# Patient Record
Sex: Male | Born: 1985
Health system: Southern US, Community
[De-identification: ages and names within clinical notes are randomized; demographics above are authoritative.]

## PROBLEM LIST (undated history)

## (undated) DIAGNOSIS — F32A Depression, unspecified: Secondary | ICD-10-CM

## (undated) DIAGNOSIS — F909 Attention-deficit hyperactivity disorder, unspecified type: Secondary | ICD-10-CM

## (undated) DIAGNOSIS — F419 Anxiety disorder, unspecified: Secondary | ICD-10-CM

## (undated) DIAGNOSIS — Z91018 Allergy to other foods: Secondary | ICD-10-CM

## (undated) DIAGNOSIS — K219 Gastro-esophageal reflux disease without esophagitis: Secondary | ICD-10-CM

## (undated) HISTORY — DX: Attention-deficit hyperactivity disorder, unspecified type: F90.9

## (undated) HISTORY — DX: Anxiety disorder, unspecified: F41.9

## (undated) HISTORY — PX: APPENDECTOMY: SHX54

## (undated) HISTORY — DX: Depression, unspecified: F32.A

## (undated) HISTORY — PX: HERNIA REPAIR: SHX51

---

## 2007-03-08 ENCOUNTER — Ambulatory Visit: Payer: Self-pay

## 2014-02-09 ENCOUNTER — Ambulatory Visit: Payer: Self-pay | Admitting: Family Medicine

## 2016-08-16 ENCOUNTER — Ambulatory Visit
Admission: EM | Admit: 2016-08-16 | Discharge: 2016-08-16 | Disposition: A | Discharge status: Home / Self Care | Payer: 59 | Attending: Family Medicine | Admitting: Family Medicine

## 2016-08-16 ENCOUNTER — Encounter: Payer: Self-pay | Admitting: Emergency Medicine

## 2016-08-16 DIAGNOSIS — S70369A Insect bite (nonvenomous), unspecified thigh, initial encounter: Secondary | ICD-10-CM

## 2016-08-16 DIAGNOSIS — R21 Rash and other nonspecific skin eruption: Secondary | ICD-10-CM

## 2016-08-16 DIAGNOSIS — L509 Urticaria, unspecified: Secondary | ICD-10-CM

## 2016-08-16 DIAGNOSIS — S70361A Insect bite (nonvenomous), right thigh, initial encounter: Secondary | ICD-10-CM

## 2016-08-16 DIAGNOSIS — W57XXXA Bitten or stung by nonvenomous insect and other nonvenomous arthropods, initial encounter: Secondary | ICD-10-CM

## 2016-08-16 LAB — CBC WITH DIFFERENTIAL/PLATELET
BASOS PCT: 1 %
Basophils Absolute: 0 10*3/uL (ref 0–0.1)
EOS ABS: 0.1 10*3/uL (ref 0–0.7)
Eosinophils Relative: 1 %
HCT: 46.9 % (ref 40.0–52.0)
Hemoglobin: 15.9 g/dL (ref 13.0–18.0)
LYMPHS ABS: 2.9 10*3/uL (ref 1.0–3.6)
Lymphocytes Relative: 32 %
MCH: 28.3 pg (ref 26.0–34.0)
MCHC: 33.9 g/dL (ref 32.0–36.0)
MCV: 83.4 fL (ref 80.0–100.0)
MONO ABS: 0.7 10*3/uL (ref 0.2–1.0)
Monocytes Relative: 8 %
NEUTROS PCT: 58 %
Neutro Abs: 5.4 10*3/uL (ref 1.4–6.5)
PLATELETS: 293 10*3/uL (ref 150–440)
RBC: 5.63 MIL/uL (ref 4.40–5.90)
RDW: 13.3 % (ref 11.5–14.5)
WBC: 9.1 10*3/uL (ref 3.8–10.6)

## 2016-08-16 MED ORDER — RANITIDINE HCL 150 MG PO CAPS: 150.0000 mg | ORAL_CAPSULE | Freq: Two times a day (BID) | ORAL | 0 refills | Status: DC

## 2016-08-16 MED ORDER — CETIRIZINE HCL 10 MG PO TABS: 10.0000 mg | ORAL_TABLET | Freq: Every day | ORAL | 0 refills | Status: DC

## 2016-08-16 MED ORDER — LORATADINE 10 MG PO TABS: 10.0000 mg | ORAL_TABLET | Freq: Every day | ORAL | 0 refills | Status: DC

## 2016-08-16 MED ORDER — PREDNISONE 10 MG (21) PO TBPK: ORAL_TABLET | ORAL | 0 refills | Status: DC

## 2016-08-16 NOTE — ED Provider Notes (Signed)
[facility]    CSN: [id number] Arrival date & time: 08/16/16  1761     History   Chief Complaint Chief Complaint  Patient presents with  . Rash    HPI Darren Miller is a 31 y.o. male.   Patient's here because of rash. He reports rash started yesterday rash is described as being urticaria. Everything started yesterday after eating a pulled pork sandwich. He is taking no new medications but he did state that Sunday he pulled a small tic off of him that was on his right thigh. He thought it looked a little bit like [place] but he does also state do not have the white marking in the middle. No history of hives before no known drug allergies. He's had abdominal surgery at medical problems he takes zantac daily. no known drug allergies she does smoke daily.   The history is provided by the patient. No language interpreter was used.  Rash  Location:  Full body Severity:  Severe Onset quality:  Sudden Duration:  2 days Timing:  Intermittent Progression:  Waxing and waning Chronicity:  New Relieved by:  Nothing Ineffective treatments:  Antihistamines Associated symptoms: no fever, no myalgias, no shortness of breath, no sore throat, no throat swelling, no tongue swelling and not wheezing     History reviewed. No pertinent past medical history.  There are no active problems to display for this patient.   Past Surgical History:  Procedure Laterality Date  . ABDOMINAL SURGERY         Home Medications    Prior to Admission medications   Medication Sig Start Date End Date Taking? Authorizing Provider  cetirizine (ZYRTEC) 10 MG tablet Take 1 tablet (10 mg total) by mouth daily. If needed at night for itching not relieved by Claritin in the morning. 08/16/16   Frederich Cha, MD  loratadine (CLARITIN) 10 MG tablet Take 1 tablet (10 mg total) by mouth daily. Take 1 tablet in the morning. As needed for itching. 08/16/16   Frederich Cha, MD  predniSONE (STERAPRED UNI-PAK  21 TAB) 10 MG (21) TBPK tablet Sig 6 tablet day 1, 5 tablets day 2, 4 tablets day 3,,3tablets day 4, 2 tablets day 5, 1 tablet day 6 take all tablets orally 08/16/16   Frederich Cha, MD  ranitidine (ZANTAC) 150 MG capsule Take 1 capsule (150 mg total) by mouth 2 (two) times daily. 08/16/16   Frederich Cha, MD    Family History History reviewed. No pertinent family history.  Social History Social History  Substance Use Topics  . Smoking status: Current Every Day Smoker    Types: Cigarettes  . Smokeless tobacco: Never Used  . Alcohol use Yes     Allergies   Patient has no known allergies.   Review of Systems Review of Systems  Constitutional: Negative for fever.  HENT: Negative for sore throat, trouble swallowing and voice change.   Respiratory: Negative for cough, chest tightness, shortness of breath and wheezing.   Musculoskeletal: Negative for myalgias.  Skin: Positive for rash.  All other systems reviewed and are negative.    Physical Exam Triage Vital Signs ED Triage Vitals  Enc Vitals Group     BP 08/16/16 0836 127/86     Pulse Rate 08/16/16 0836 68     Resp 08/16/16 0836 16     Temp 08/16/16 0836 98.4 F (36.9 C)     Temp Source 08/16/16 0836 Oral     SpO2 08/16/16 0836 99 %  Weight 08/16/16 0834 230 lb (104.3 kg)     Height 08/16/16 0834 5\' 10"  (1.778 m)     Head Circumference --      Peak Flow --      Pain Score 08/16/16 0834 0     Pain Loc --      Pain Edu? --      Excl. in [place]? --    No data found.   Updated Vital Signs BP 127/86 (BP Location: Left Arm)   Pulse 68   Temp 98.4 F (36.9 C) (Oral)   Resp 16   Ht 5\' 10"  (1.778 m)   Wt 230 lb (104.3 kg)   SpO2 99%   BMI 33.00 kg/m   Visual Acuity Right Eye Distance:   Left Eye Distance:   Bilateral Distance:    Right Eye Near:   Left Eye Near:    Bilateral Near:     Physical Exam  Constitutional: He appears well-developed and well-nourished.  HENT:  Head: Normocephalic and atraumatic.    Right Ear: External ear normal.  Nose: Nose normal. No mucosal edema.  Mouth/Throat: Uvula is midline, oropharynx is clear and moist and mucous membranes are normal. No oral lesions. No dental abscesses or uvula swelling.  Neck: Normal range of motion. Neck supple. No thyromegaly present.  Cardiovascular: Normal rate.   Pulmonary/Chest: Effort normal and breath sounds normal. No respiratory distress.  Musculoskeletal: Normal range of motion.  Lymphadenopathy:    He has no cervical adenopathy.  Neurological: He is alert.  Skin: Rash noted. Rash is urticarial.  She has scattered urticarial-like rash is present on some areas worsening of this shows pictures of the rash yesterday was almost his body and much more raised and prominent  Psychiatric: He has a normal mood and affect.  Vitals reviewed.    UC Treatments / Results  Labs (all labs ordered are listed, but only abnormal results are displayed) Labs Reviewed  CBC WITH DIFFERENTIAL/PLATELET  ROCKY MTN SPOTTED FVR ABS PNL(IGG+IGM)  B. BURGDORFI ANTIBODIES    EKG  EKG Interpretation None       Radiology No results found.  Procedures Procedures (including critical care time)  Medications Ordered in UC Medications - No data to display   Initial Impression / Assessment and Plan / UC Course  I have reviewed the triage vital signs and the nursing notes.  Pertinent labs & imaging results that were available during my care of the patient were reviewed by me and considered in my medical decision making (see chart for details).    Doubt the tick exposures causing him to have a reaction allergy to red meats will check Lyme's test and Lachman's by fever also be very doubtful for him to have this problem so soon after removing that tick tick bite a month ago may be but not particularly move Sunday. Will also recommend he stop the Benadryl he took about 3 Benadryl's within an hour yesterday the child help with her itching course a  cause sedation. We'll place on 6 day course of prednisone may not be treating anything or do any different. There is no signs of anaphylaxis this time. We'll place him on Claritin 10 mg 1 tablet daily Zantac 150 twice a day and Zyrtec to take at night if needed stay with Benadryl and work note for today and tomorrow will be given as well. Also try to provide education about urticaria as well   Final Clinical Impressions(s) / UC Diagnoses   Final  diagnoses:  Rash  Hives  Rash and nonspecific skin eruption  Tick bite of thigh, unspecified laterality, initial encounter    New Prescriptions New Prescriptions   CETIRIZINE (ZYRTEC) 10 MG TABLET    Take 1 tablet (10 mg total) by mouth daily. If needed at night for itching not relieved by Claritin in the morning.   LORATADINE (CLARITIN) 10 MG TABLET    Take 1 tablet (10 mg total) by mouth daily. Take 1 tablet in the morning. As needed for itching.   PREDNISONE (STERAPRED UNI-PAK 21 TAB) 10 MG (21) TBPK TABLET    Sig 6 tablet day 1, 5 tablets day 2, 4 tablets day 3,,3tablets day 4, 2 tablets day 5, 1 tablet day 6 take all tablets orally   RANITIDINE (ZANTAC) 150 MG CAPSULE    Take 1 capsule (150 mg total) by mouth 2 (two) times daily.   Note: This dictation was prepared with Dragon dictation along with smaller phrase technology. Any transcriptional errors that result from this process are unintentional.   Frederich Cha, MD 08/16/16 [phone]

## 2016-08-16 NOTE — ED Triage Notes (Signed)
Patient reports hives and itching all over his body that started last night.

## 2016-08-17 ENCOUNTER — Emergency Department
Admission: EM | Admit: 2016-08-17 | Discharge: 2016-08-18 | Disposition: A | Discharge status: Home / Self Care | Payer: 59 | Attending: Emergency Medicine | Admitting: Emergency Medicine

## 2016-08-17 ENCOUNTER — Encounter: Payer: Self-pay | Admitting: Emergency Medicine

## 2016-08-17 DIAGNOSIS — R21 Rash and other nonspecific skin eruption: Secondary | ICD-10-CM | POA: Diagnosis present

## 2016-08-17 DIAGNOSIS — F17210 Nicotine dependence, cigarettes, uncomplicated: Secondary | ICD-10-CM | POA: Diagnosis not present

## 2016-08-17 DIAGNOSIS — L509 Urticaria, unspecified: Secondary | ICD-10-CM | POA: Insufficient documentation

## 2016-08-17 LAB — B. BURGDORFI ANTIBODIES: B burgdorferi Ab IgG+IgM: <0.91 {ISR} (ref 0.00–0.90)

## 2016-08-17 MED ORDER — EPINEPHRINE 0.3 MG/0.3ML IJ SOAJ
0.3000 mg | Freq: Once | INTRAMUSCULAR | 0 refills | Status: AC
Start: 2016-08-18 — End: 2016-08-18

## 2016-08-17 MED ORDER — PREDNISONE 20 MG PO TABS: 60.0000 mg | ORAL_TABLET | Freq: Every day | ORAL | 0 refills | Status: DC

## 2016-08-17 MED ORDER — PREDNISONE 20 MG PO TABS
50.0000 mg | ORAL_TABLET | Freq: Once | ORAL | Status: AC
  Administered 2016-08-17: 50 mg via ORAL
  Filled 2016-08-17: qty 2

## 2016-08-17 NOTE — ED Provider Notes (Signed)
[facility] Emergency Department Provider Note   ____________________________________________   First MD Initiated Contact with Patient 08/17/16 2308     (approximate)  I have reviewed the triage vital signs and the nursing notes.   HISTORY  Chief Complaint Rash    HPI Darren Miller is a 31 y.o. male who comes into the hospital today with hives. The patient reports that the hives started Wednesday. He was at work and had just eaten a lunch of pulled pork. He reports that they appeared all over his body. Initially there was small and then they became more confluent and larger. The patient took some Benadryl for his hives but it goes away and then comes back. He went to urgent care 2 days ago and has been taking 10 mg of prednisone with Benadryl, Claritin and Zyrtec but has not been helping. The hives continue to return each day. The patient has had some chest tightness so they decided to come in for evaluation. The patient took some Benadryl before so he reports that his hives are improved at the moment. The patient had eggs with chicken salad and pizza today and yesterday he slept most of the day. The patient noticed a tick on his thigh on Saturday which she pulled off. He denies any fever and his blood pressure has been a little elevated. The patient states that he has had some swelling and the rash has been on his hands as well. The patient was concerned so he decided to come in today. He is here for evaluation. He does not have a primary care physician.   History reviewed. No pertinent past medical history.  There are no active problems to display for this patient.   Past Surgical History:  Procedure Laterality Date  . APPENDECTOMY      Prior to Admission medications   Medication Sig Start Date End Date Taking? Authorizing Provider  cetirizine (ZYRTEC) 10 MG tablet Take 1 tablet (10 mg total) by mouth daily. If needed at night for itching not relieved  by Claritin in the morning. 08/16/16   Frederich Cha, MD  EPINEPHrine (EPIPEN 2-PAK) 0.3 mg/0.3 mL IJ SOAJ injection Inject 0.3 mLs (0.3 mg total) into the muscle once. As needed for respiratory distress, or severe angioedema 08/18/16 08/18/16  Loney Hering, MD  loratadine (CLARITIN) 10 MG tablet Take 1 tablet (10 mg total) by mouth daily. Take 1 tablet in the morning. As needed for itching. 08/16/16   Frederich Cha, MD  predniSONE (DELTASONE) 20 MG tablet Take 3 tablets (60 mg total) by mouth daily. 08/17/16   Loney Hering, MD  predniSONE (STERAPRED UNI-PAK 21 TAB) 10 MG (21) TBPK tablet Sig 6 tablet day 1, 5 tablets day 2, 4 tablets day 3,,3tablets day 4, 2 tablets day 5, 1 tablet day 6 take all tablets orally 08/16/16   Frederich Cha, MD  ranitidine (ZANTAC) 150 MG capsule Take 1 capsule (150 mg total) by mouth 2 (two) times daily. 08/16/16   Frederich Cha, MD    Allergies Patient has no known allergies.  No family history on file.  Social History Social History  Substance Use Topics  . Smoking status: Current Every Day Smoker    Types: Cigarettes  . Smokeless tobacco: Never Used  . Alcohol use Yes    Review of Systems  Constitutional: No fever/chills Eyes: No visual changes. ENT: No sore throat. Cardiovascular: Intermittent chest tightness Respiratory: Denies shortness of breath. Gastrointestinal: No abdominal pain.  No nausea, no vomiting.  No diarrhea.  No constipation. Genitourinary: Negative for dysuria. Musculoskeletal: Negative for back pain. Skin: Hives and itching Neurological: Negative for headaches, focal weakness or numbness.   ____________________________________________   PHYSICAL EXAM:  VITAL SIGNS: ED Triage Vitals [08/17/16 2146]  Enc Vitals Group     BP (!) 143/84     Pulse Rate 97     Resp 20     Temp 98.6 F (37 C)     Temp Source Oral     SpO2 98 %     Weight      Height      Head Circumference      Peak Flow      Pain Score      Pain Loc       Pain Edu?      Excl. in [place]?     Constitutional: Alert and oriented. Well appearing and in mild distress. Eyes: Conjunctivae are normal. PERRL. EOMI. Head: Atraumatic. Nose: No congestion/rhinnorhea. Mouth/Throat: Mucous membranes are moist.  Oropharynx non-erythematous. Cardiovascular: Normal rate, regular rhythm. Grossly normal heart sounds.  Good peripheral circulation. Respiratory: Normal respiratory effort.  No retractions. Lungs CTAB. Gastrointestinal: Soft and nontender. No distention. Positive bowel sounds Musculoskeletal: No lower extremity tenderness nor edema.   Neurologic:  Normal speech and language.  Skin:  Skin is warm, dry and intact. Some small scattered sparse hives to left upper extremity no significant confluent hives noted. Psychiatric: Mood and affect are normal.   ____________________________________________   LABS (all labs ordered are listed, but only abnormal results are displayed)  Labs Reviewed - No data to display ____________________________________________  EKG  none ____________________________________________  RADIOLOGY  No results found.  ____________________________________________   PROCEDURES  Procedure(s) performed: None  Procedures  Critical Care performed: No  ____________________________________________   INITIAL IMPRESSION / ASSESSMENT AND PLAN / ED COURSE  Pertinent labs & imaging results that were available during my care of the patient were reviewed by me and considered in my medical decision making (see chart for details).  This is a 31 year old male who comes into the hospital today with hives. The patient has had hives going on for approximately 3 days. He's been taking Benadryl as well as some prednisone and Zyrtec and Claritin for the last couple of days. He reports that it does still come. The patient's wife showed me some pictures of confluent hives that he has had a right now his hives are improved. The patient  states that he did take some Benadryl before coming. I informed the patient that I feel that he needs a higher dose of prednisone and I will give him 50 mg. He also needs to consider that he may have developed a protein allergy due to his tick bite. I've encouraged him to hold off on animal proteins and to be tested by an allergist. I will also write the patient an EpiPen in the event that his reactions become worse. The patient will be discharged to home.      ____________________________________________   FINAL CLINICAL IMPRESSION(S) / ED DIAGNOSES  Final diagnoses:  Hives      NEW MEDICATIONS STARTED DURING THIS VISIT:  New Prescriptions   EPINEPHRINE (EPIPEN 2-PAK) 0.3 MG/0.3 ML IJ SOAJ INJECTION    Inject 0.3 mLs (0.3 mg total) into the muscle once. As needed for respiratory distress, or severe angioedema   PREDNISONE (DELTASONE) 20 MG TABLET    Take 3 tablets (60 mg total) by mouth daily.  Note:  This document was prepared using Dragon voice recognition software and may include unintentional dictation errors.    Loney Hering, MD 08/17/16 [phone]

## 2016-08-17 NOTE — ED Triage Notes (Signed)
Patient reports keeps breaking out in rash and occasional hives that started 08/15/16.  Patient with no respiratory distress noted.

## 2016-08-17 NOTE — Discharge Instructions (Signed)
PLease follow up with allergy for further evaluation.

## 2016-08-20 LAB — RMSF, IGG, IFA: RMSF, IGG, IFA: <1:64 {titer}

## 2016-08-20 LAB — ROCKY MTN SPOTTED FVR ABS PNL(IGG+IGM)
RMSF IGG: POSITIVE — AB
RMSF IGM: 0.22 {index} (ref 0.00–0.89)

## 2016-08-23 DIAGNOSIS — L500 Allergic urticaria: Secondary | ICD-10-CM | POA: Diagnosis not present

## 2016-08-24 DIAGNOSIS — J305 Allergic rhinitis due to food: Secondary | ICD-10-CM | POA: Diagnosis not present

## 2016-08-27 ENCOUNTER — Telehealth: Payer: Self-pay

## 2016-08-27 MED ORDER — DOXYCYCLINE HYCLATE 100 MG PO CAPS: 100.0000 mg | ORAL_CAPSULE | Freq: Two times a day (BID) | ORAL | 0 refills | Status: DC

## 2016-09-04 ENCOUNTER — Ambulatory Visit: Payer: Self-pay | Admitting: Family Medicine

## 2016-11-15 ENCOUNTER — Ambulatory Visit
Admission: EM | Admit: 2016-11-15 | Discharge: 2016-11-15 | Disposition: A | Discharge status: Home / Self Care | Payer: 59 | Attending: Family Medicine | Admitting: Family Medicine

## 2016-11-15 ENCOUNTER — Encounter: Payer: Self-pay | Admitting: Emergency Medicine

## 2016-11-15 DIAGNOSIS — R42 Dizziness and giddiness: Secondary | ICD-10-CM

## 2016-11-15 DIAGNOSIS — K529 Noninfective gastroenteritis and colitis, unspecified: Secondary | ICD-10-CM | POA: Diagnosis not present

## 2016-11-15 HISTORY — DX: Allergy to other foods: Z91.018

## 2016-11-15 MED ORDER — ONDANSETRON HCL 4 MG PO TABS: 4.0000 mg | ORAL_TABLET | Freq: Three times a day (TID) | ORAL | 0 refills | Status: DC | PRN

## 2016-11-15 NOTE — Discharge Instructions (Signed)
Zofran as needed for nausea.  Hydrate, hydrate, hydrate.  Take care  Dr. Lacinda Axon

## 2016-11-15 NOTE — ED Provider Notes (Signed)
[facility]    CSN: [id number] Arrival date & time: 11/15/16  9509     History   Chief Complaint Chief Complaint  Patient presents with  . Dizziness  . Generalized Body Aches   HPI  31 year old male presents with the above complaints.  Patient states that on Tuesday he developed nausea, vomiting, diarrhea.  He reports associated abdominal pain.  He also had fever.  His GI symptoms have now improved.  He has not had any vomiting or diarrhea today.  However, he still does not feel well.  He reports body aches, dizziness.  He states that he is able to eat and drink but has little interest in doing so.  No known inciting factor.  No known exacerbating or relieving factors.  No other associated symptoms.  No other complaints at this time.  Past Medical History:  Diagnosis Date  . Allergy to alpha-gal    Past Surgical History:  Procedure Laterality Date  . APPENDECTOMY     Home Medications    Prior to Admission medications   Medication Sig Start Date End Date Taking? Authorizing Provider  ondansetron (ZOFRAN) 4 MG tablet Take 1 tablet (4 mg total) by mouth every 8 (eight) hours as needed for nausea or vomiting. 11/15/16   Coral Spikes, DO   Family History Family History  Problem Relation Age of Onset  . Thyroid disease Mother    Social History Social History  Substance Use Topics  . Smoking status: Light Tobacco Smoker    Types: Cigarettes  . Smokeless tobacco: Never Used     Comment: smokes socially  . Alcohol use Yes     Comment: socially    Allergies   Patient has no known allergies.   Review of Systems Review of Systems  Constitutional: Positive for fever.  Gastrointestinal: Positive for abdominal pain, diarrhea, nausea and vomiting.  Musculoskeletal:       Body aches.  Neurological: Positive for dizziness.   Physical Exam Triage Vital Signs ED Triage Vitals  Enc Vitals Group     BP 11/15/16 0839 140/86     Pulse Rate 11/15/16 0839 84    Resp 11/15/16 0839 16     Temp 11/15/16 0839 98.6 F (37 C)     Temp Source 11/15/16 0839 Oral     SpO2 11/15/16 0839 99 %     Weight 11/15/16 0840 223 lb (101.2 kg)     Height 11/15/16 0840 5\' 10"  (1.778 m)     Head Circumference --      Peak Flow --      Pain Score 11/15/16 0840 0     Pain Loc --      Pain Edu? --      Excl. in [place]? --    No data found.   Updated Vital Signs BP 140/86 (BP Location: Left Arm)   Pulse 84   Temp 98.6 F (37 C) (Oral)   Resp 16   Ht 5\' 10"  (1.778 m)   Wt 223 lb (101.2 kg)   SpO2 99%   BMI 32.00 kg/m  Physical Exam  Constitutional: He is oriented to person, place, and time. He appears well-developed. No distress.  HENT:  Head: Normocephalic and atraumatic.  Oropharynx clear.  Eyes: Conjunctivae are normal. No scleral icterus.  Cardiovascular: Normal rate and regular rhythm.   No murmur heard. Pulmonary/Chest: Effort normal and breath sounds normal. He has no wheezes. He has no rales.  Abdominal:  Soft, nondistended.  Mildly tender diffusely.  Neurological: He is alert and oriented to person, place, and time.  Skin: Skin is warm. No rash noted.  Psychiatric: He has a normal mood and affect.  Vitals reviewed.  UC Treatments / Results  Labs (all labs ordered are listed, but only abnormal results are displayed) Labs Reviewed - No data to display  EKG  EKG Interpretation None       Radiology No results found.  Procedures Procedures (including critical care time)  Medications Ordered in UC Medications - No data to display   Initial Impression / Assessment and Plan / UC Course  I have reviewed the triage vital signs and the nursing notes.  Pertinent labs & imaging results that were available during my care of the patient were reviewed by me and considered in my medical decision making (see chart for details).     31 year old male presents with resolving gastroenteritis.  He is volume depleted.  This is the culprit of his  current symptoms.  Zofran for nausea.  Advised aggressive hydration.  Out of work for today and tomorrow.  Final Clinical Impressions(s) / UC Diagnoses   Final diagnoses:  Gastroenteritis    New Prescriptions New Prescriptions   ONDANSETRON (ZOFRAN) 4 MG TABLET    Take 1 tablet (4 mg total) by mouth every 8 (eight) hours as needed for nausea or vomiting.   Controlled Substance Prescriptions [place] Controlled Substance Registry consulted? Not Applicable   Coral Spikes, [place] 11/15/16 5056

## 2016-11-15 NOTE — ED Triage Notes (Signed)
Patient in today c/o dizziness, body aches, diarrhea and vomiting x 2 days. Patient had fever of 102. Patient denies sore throat.

## 2017-04-29 DIAGNOSIS — T7809XD Anaphylactic reaction due to other food products, subsequent encounter: Secondary | ICD-10-CM | POA: Diagnosis not present

## 2017-05-06 DIAGNOSIS — J029 Acute pharyngitis, unspecified: Secondary | ICD-10-CM | POA: Diagnosis not present

## 2017-07-12 DIAGNOSIS — Z0000 Encounter for general adult medical examination without abnormal findings: Secondary | ICD-10-CM | POA: Diagnosis not present

## 2017-07-12 DIAGNOSIS — R5383 Other fatigue: Secondary | ICD-10-CM | POA: Diagnosis not present

## 2017-07-12 DIAGNOSIS — E8809 Other disorders of plasma-protein metabolism, not elsewhere classified: Secondary | ICD-10-CM | POA: Insufficient documentation

## 2017-07-12 DIAGNOSIS — G4733 Obstructive sleep apnea (adult) (pediatric): Secondary | ICD-10-CM | POA: Insufficient documentation

## 2017-07-12 DIAGNOSIS — R0683 Snoring: Secondary | ICD-10-CM | POA: Insufficient documentation

## 2017-07-15 DIAGNOSIS — Z0000 Encounter for general adult medical examination without abnormal findings: Secondary | ICD-10-CM | POA: Diagnosis not present

## 2017-07-17 DIAGNOSIS — M79621 Pain in right upper arm: Secondary | ICD-10-CM | POA: Diagnosis not present

## 2017-07-17 DIAGNOSIS — M79601 Pain in right arm: Secondary | ICD-10-CM | POA: Diagnosis not present

## 2017-08-01 ENCOUNTER — Ambulatory Visit: Payer: 59 | Attending: Internal Medicine

## 2017-08-01 DIAGNOSIS — G4710 Hypersomnia, unspecified: Secondary | ICD-10-CM | POA: Insufficient documentation

## 2017-08-01 DIAGNOSIS — G4733 Obstructive sleep apnea (adult) (pediatric): Secondary | ICD-10-CM | POA: Insufficient documentation

## 2017-08-01 DIAGNOSIS — G4730 Sleep apnea, unspecified: Secondary | ICD-10-CM | POA: Diagnosis not present

## 2017-08-01 DIAGNOSIS — G4761 Periodic limb movement disorder: Secondary | ICD-10-CM | POA: Insufficient documentation

## 2017-08-01 DIAGNOSIS — R0683 Snoring: Secondary | ICD-10-CM | POA: Diagnosis present

## 2017-08-05 DIAGNOSIS — H1033 Unspecified acute conjunctivitis, bilateral: Secondary | ICD-10-CM | POA: Diagnosis not present

## 2017-08-14 DIAGNOSIS — F988 Other specified behavioral and emotional disorders with onset usually occurring in childhood and adolescence: Secondary | ICD-10-CM | POA: Insufficient documentation

## 2017-10-08 DIAGNOSIS — Z23 Encounter for immunization: Secondary | ICD-10-CM | POA: Diagnosis not present

## 2017-11-05 DIAGNOSIS — K922 Gastrointestinal hemorrhage, unspecified: Secondary | ICD-10-CM | POA: Diagnosis not present

## 2017-11-11 ENCOUNTER — Other Ambulatory Visit: Payer: Self-pay

## 2017-11-11 ENCOUNTER — Ambulatory Visit (INDEPENDENT_AMBULATORY_CARE_PROVIDER_SITE_OTHER): Payer: 59 | Admitting: Gastroenterology

## 2017-11-11 ENCOUNTER — Encounter: Payer: Self-pay | Admitting: Gastroenterology

## 2017-11-11 VITALS — BP 105/66 | HR 71 | Ht 70.0 in | Wt 222.6 lb

## 2017-11-11 DIAGNOSIS — K219 Gastro-esophageal reflux disease without esophagitis: Secondary | ICD-10-CM

## 2017-11-11 DIAGNOSIS — K625 Hemorrhage of anus and rectum: Secondary | ICD-10-CM

## 2017-11-11 DIAGNOSIS — K920 Hematemesis: Secondary | ICD-10-CM

## 2017-11-11 NOTE — Progress Notes (Signed)
Jonathon Bellows MD, MRCP(U.K) [address]  [place]  [place], [place] [address]  Main: [phone]  Fax: [phone]   Gastroenterology Consultation  Referring Provider:     Kirk Ruths, MD Primary Care Physician:  Kirk Ruths, MD Primary Gastroenterologist:  Dr. Jonathon Bellows  Reason for Consultation:     Lower GI bleeding         HPI:   Darren Miller is a 32 y.o. y/o male referred for consultation & management  by Dr. Ouida Sills, Ocie Cornfield, MD.    Rectal bleeding :  Onset and where was blood seen  :was on vacation 2 weeks back, woke up one worning , stomach didn't feel right , had softer stools, saw blood in the bowel and toilet paper, more cramping no pain. Lasted 2 days then stopped , then on returned on and off for a few days , still happens occaisonally.  Frequency of bowel movements :once a day  Consistency : soft  Change in shape of stool:no  Pain associated with bowel movements:no  Blood thinner usage:no  NSAID's: no  Prior colonoscopy :no  Family history of colon cancer or polyps:no  Weight loss:no   At the same time he says he had one episode of vomiting with blood in it.  Hb 15 grams on 11/05/17    Has had heartburn for many years, does not smoke regularly. No issues with swallowing .   Past Medical History:  Diagnosis Date  . Allergy to alpha-gal     Past Surgical History:  Procedure Laterality Date  . APPENDECTOMY      Prior to Admission medications   Medication Sig Start Date End Date Taking? Authorizing Provider  ondansetron (ZOFRAN) 4 MG tablet Take 1 tablet (4 mg total) by mouth every 8 (eight) hours as needed for nausea or vomiting. 11/15/16   Coral Spikes, DO    Family History  Problem Relation Age of Onset  . Thyroid disease Mother      Social History   Tobacco Use  . Smoking status: Light Tobacco Smoker    Types: Cigarettes  . Smokeless tobacco: Never Used  . Tobacco comment: smokes socially  Substance Use  Topics  . Alcohol use: Yes    Comment: socially  . Drug use: No    Allergies as of 11/11/2017  . (No Known Allergies)    Review of Systems:    All systems reviewed and negative except where noted in HPI.   Physical Exam:  BP 105/66   Pulse 71   Ht 5\' 10"  (1.778 m)   Wt 222 lb 9.6 oz (101 kg)   BMI 31.94 kg/m  No LMP for male patient. Psych:  Alert and cooperative. Normal mood and affect. General:   Alert,  Well-developed, well-nourished, pleasant and cooperative in NAD Head:  Normocephalic and atraumatic. Eyes:  Sclera clear, no icterus.   Conjunctiva pink. Ears:  Normal auditory acuity. Nose:  No deformity, discharge, or lesions. Mouth:  No deformity or lesions,oropharynx pink & moist. Neck:  Supple; no masses or thyromegaly. Lungs:  Respirations even and unlabored.  Clear throughout to auscultation.   No wheezes, crackles, or rhonchi. No acute distress. Heart:  Regular rate and rhythm; no murmurs, clicks, rubs, or gallops. Abdomen:  Normal bowel sounds.  No bruits.  Soft, non-tender and non-distended without masses, hepatosplenomegaly or hernias noted.  No guarding or rebound tenderness.    Neurologic:  Alert and oriented x3;  grossly normal neurologically.  Skin:  Intact without significant lesions or rashes. No jaundice. Lymph Nodes:  No significant cervical adenopathy. Psych:  Alert and cooperative. Normal mood and affect.  Imaging Studies: No results found.  Assessment and Plan:   MARK HASSEY is a 33 y.o. y/o male has been referred for rectal bleeding. On episode of hematemesis. History suggestive of GERD. ..  Plan  1. Colonoscopy + EGD. Counseled on life style changes for GERD, commence on prilosec OTC 40 mg once a day . Advised to lose weight. Patient information on GERD provided.    I have discussed alternative options, risks & benefits,  which include, but are not limited to, bleeding, infection, perforation,respiratory complication & drug reaction.  The  patient agrees with this plan & written consent will be obtained.    Follow up 6 weeks   Dr Jonathon Bellows MD,MRCP(U.K)

## 2017-11-11 NOTE — Patient Instructions (Signed)

## 2017-11-18 ENCOUNTER — Encounter: Payer: Self-pay | Admitting: *Deleted

## 2017-11-19 ENCOUNTER — Ambulatory Visit: Payer: 59 | Admitting: Certified Registered Nurse Anesthetist

## 2017-11-19 ENCOUNTER — Ambulatory Visit
Admission: RE | Admit: 2017-11-19 | Discharge: 2017-11-19 | Disposition: A | Discharge status: Home / Self Care | Payer: 59 | Source: Ambulatory Visit | Attending: Gastroenterology | Admitting: Gastroenterology

## 2017-11-19 ENCOUNTER — Encounter: Payer: Self-pay | Admitting: *Deleted

## 2017-11-19 ENCOUNTER — Encounter
Admission: RE | Disposition: A | Discharge status: Home / Self Care | Payer: Self-pay | Source: Ambulatory Visit | Attending: Gastroenterology

## 2017-11-19 ENCOUNTER — Telehealth: Payer: Self-pay | Admitting: Gastroenterology

## 2017-11-19 DIAGNOSIS — K920 Hematemesis: Secondary | ICD-10-CM | POA: Diagnosis not present

## 2017-11-19 DIAGNOSIS — K209 Esophagitis, unspecified: Secondary | ICD-10-CM

## 2017-11-19 DIAGNOSIS — K219 Gastro-esophageal reflux disease without esophagitis: Secondary | ICD-10-CM

## 2017-11-19 DIAGNOSIS — F17210 Nicotine dependence, cigarettes, uncomplicated: Secondary | ICD-10-CM | POA: Diagnosis not present

## 2017-11-19 DIAGNOSIS — K210 Gastro-esophageal reflux disease with esophagitis: Secondary | ICD-10-CM | POA: Diagnosis not present

## 2017-11-19 DIAGNOSIS — K649 Unspecified hemorrhoids: Secondary | ICD-10-CM | POA: Diagnosis not present

## 2017-11-19 DIAGNOSIS — K625 Hemorrhage of anus and rectum: Secondary | ICD-10-CM

## 2017-11-19 DIAGNOSIS — K640 First degree hemorrhoids: Secondary | ICD-10-CM | POA: Diagnosis not present

## 2017-11-19 HISTORY — PX: ESOPHAGOGASTRODUODENOSCOPY (EGD) WITH PROPOFOL: SHX5813

## 2017-11-19 HISTORY — PX: COLONOSCOPY WITH PROPOFOL: SHX5780

## 2017-11-19 SURGERY — COLONOSCOPY WITH PROPOFOL
Anesthesia: General

## 2017-11-19 MED ORDER — ONDANSETRON HCL 4 MG/2ML IJ SOLN
INTRAMUSCULAR | Status: AC
  Filled 2017-11-19: qty 2

## 2017-11-19 MED ORDER — FENTANYL CITRATE (PF) 100 MCG/2ML IJ SOLN
INTRAMUSCULAR | Status: DC | PRN
  Administered 2017-11-19: 50 ug via INTRAVENOUS
  Administered 2017-11-19 (×2): 25 ug via INTRAVENOUS

## 2017-11-19 MED ORDER — LIDOCAINE HCL (CARDIAC) PF 100 MG/5ML IV SOSY
PREFILLED_SYRINGE | INTRAVENOUS | Status: DC | PRN
  Administered 2017-11-19: 100 mg via INTRAVENOUS

## 2017-11-19 MED ORDER — PROPOFOL 500 MG/50ML IV EMUL
INTRAVENOUS | Status: AC
  Filled 2017-11-19: qty 50

## 2017-11-19 MED ORDER — PROPOFOL 10 MG/ML IV BOLUS
INTRAVENOUS | Status: AC
  Filled 2017-11-19: qty 40

## 2017-11-19 MED ORDER — PROPOFOL 10 MG/ML IV BOLUS
INTRAVENOUS | Status: DC | PRN
  Administered 2017-11-19 (×2): 30 mg via INTRAVENOUS
  Administered 2017-11-19: 100 mg via INTRAVENOUS

## 2017-11-19 MED ORDER — PROPOFOL 500 MG/50ML IV EMUL
INTRAVENOUS | Status: DC | PRN
  Administered 2017-11-19: 130 ug/kg/min via INTRAVENOUS

## 2017-11-19 MED ORDER — OMEPRAZOLE 40 MG PO CPDR: 40.0000 mg | DELAYED_RELEASE_CAPSULE | Freq: Every day | ORAL | 3 refills | Status: DC

## 2017-11-19 MED ORDER — SODIUM CHLORIDE 0.9 % IV SOLN
INTRAVENOUS | Status: DC
  Administered 2017-11-19: 09:00:00 via INTRAVENOUS

## 2017-11-19 MED ORDER — FENTANYL CITRATE (PF) 100 MCG/2ML IJ SOLN
INTRAMUSCULAR | Status: AC
  Filled 2017-11-19: qty 2

## 2017-11-19 MED ORDER — ONDANSETRON HCL 4 MG/2ML IJ SOLN
INTRAMUSCULAR | Status: DC | PRN
  Administered 2017-11-19: 4 mg via INTRAVENOUS

## 2017-11-19 NOTE — Op Note (Addendum)
[facility] Gastroenterology Patient Name: Darren Miller Procedure Date: 11/19/2017 8:34 AM MRN: [id number] Account #: [id number] Date of Birth: February 18, 1985 Admit Type: Outpatient Age: 31 Room: [facility] ENDO ROOM 1 Gender: Male Note Status: Finalized Procedure:            Upper GI endoscopy Indications:          Hematemesis Providers:            Jonathon Bellows MD, MD Referring MD:         Ocie Cornfield. Ouida Sills MD, MD (Referring MD) Medicines:            Monitored Anesthesia Care Complications:        No immediate complications. Procedure:            Pre-Anesthesia Assessment:                       - Prior to the procedure, a History and Physical was                        performed, and patient medications, allergies and                        sensitivities were reviewed. The patient's tolerance of                        previous anesthesia was reviewed.                       - The risks and benefits of the procedure and the                        sedation options and risks were discussed with the                        patient. All questions were answered and informed                        consent was obtained.                       - ASA Grade Assessment: II - A patient with mild                        systemic disease.                       The Endoscope was introduced through the mouth, and                        advanced to the third part of duodenum. The upper GI                        endoscopy was accomplished with ease. The patient                        tolerated the procedure well. Findings:      The examined duodenum was normal.      The stomach was normal.      The cardia and gastric fundus were normal on retroflexion.      LA Grade B (one or more mucosal breaks greater than  5 mm, not extending       between the tops of two mucosal folds) esophagitis with no bleeding was       found in the lower third of the esophagus.      The exam was otherwise without  abnormality. Impression:           - Normal examined duodenum.                       - Normal stomach.                       - LA Grade B reflux esophagitis.                       - The examination was otherwise normal.                       - No specimens collected. Recommendation:       - Use Prilosec (omeprazole) 40 mg PO BID for 8 weeks.                       - Repeat upper endoscopy in 3 months to evaluate the                        response to therapy.                       - Perform a colonoscopy today. Procedure Code(s):    --- Professional ---                       [phone], Esophagogastroduodenoscopy, flexible, transoral;                        diagnostic, including collection of specimen(s) by                        brushing or washing, when performed (separate procedure) Diagnosis Code(s):    --- Professional ---                       K21.0, Gastro-esophageal reflux disease with esophagitis                       K92.0, Hematemesis CPT copyright 2018 American Medical Association. All rights reserved. The codes documented in this report are preliminary and upon coder review may  be revised to meet current compliance requirements. Jonathon Bellows, MD Jonathon Bellows MD, MD 11/19/2017 8:56:42 AM This report has been signed electronically. Number of Addenda: 0 Note Initiated On: 11/19/2017 8:34 AM      [facility]

## 2017-11-19 NOTE — Anesthesia Post-op Follow-up Note (Signed)
Anesthesia QCDR form completed.        

## 2017-11-19 NOTE — H&P (Signed)
Jonathon Bellows, MD [address], [place], [place], [place], [address] [address], [place], [place], [place], [address] Phone: [phone]  Fax: [phone]  Primary Care Physician:  Kirk Ruths, MD   Pre-Procedure History & Physical: HPI:  Darren Miller is a 32 y.o. male is here for an endoscopy and colonoscopy    Past Medical History:  Diagnosis Date  . Allergy to alpha-gal     Past Surgical History:  Procedure Laterality Date  . APPENDECTOMY      Prior to Admission medications   Medication Sig Start Date End Date Taking? Authorizing Provider  amphetamine-dextroamphetamine (ADDERALL) 10 MG tablet Take by mouth. 10/03/17  Yes [provider]  ondansetron (ZOFRAN) 4 MG tablet Take 1 tablet (4 mg total) by mouth every 8 (eight) hours as needed for nausea or vomiting. Patient not taking: Reported on 11/11/2017 11/15/16   Coral Spikes, DO    Allergies as of 11/11/2017 - Review Complete 11/11/2017  Allergen Reaction Noted  . Alpha-gal  11/11/2017    Family History  Problem Relation Age of Onset  . Thyroid disease Mother     Social History   Socioeconomic History  . Marital status: Single    Spouse name: Not on file  . Number of children: Not on file  . Years of education: Not on file  . Highest education level: Not on file  Occupational History  . Not on file  Social Needs  . Financial resource strain: Not on file  . Food insecurity:    Worry: Not on file    Inability: Not on file  . Transportation needs:    Medical: Not on file    Non-medical: Not on file  Tobacco Use  . Smoking status: Light Tobacco Smoker    Types: Cigarettes  . Smokeless tobacco: Never Used  . Tobacco comment: smokes socially  Substance and Sexual Activity  . Alcohol use: Yes    Comment: socially  . Drug use: No  . Sexual activity: Not on file  Lifestyle  . Physical activity:    Days per week: Not on file    Minutes per session: Not on file  .  Stress: Not on file  Relationships  . Social connections:    Talks on phone: Not on file    Gets together: Not on file    Attends religious service: Not on file    Active member of club or organization: Not on file    Attends meetings of clubs or organizations: Not on file    Relationship status: Not on file  . Intimate partner violence:    Fear of current or ex partner: Not on file    Emotionally abused: Not on file    Physically abused: Not on file    Forced sexual activity: Not on file  Other Topics Concern  . Not on file  Social History Narrative  . Not on file    Review of Systems: See HPI, otherwise negative ROS  Physical Exam: Pulse 81   Temp (!) 96.2 F (35.7 C) (Tympanic)   Resp 18   Ht 5\' 10"  (1.778 m)   Wt 95.3 kg   SpO2 (!) 18%   BMI 30.13 kg/m  General:   Alert,  pleasant and cooperative in NAD Head:  Normocephalic and atraumatic. Neck:  Supple; no masses or thyromegaly. Lungs:  Clear throughout to auscultation, normal respiratory effort.    Heart:  +S1, +S2, Regular rate  and rhythm, No edema. Abdomen:  Soft, nontender and nondistended. Normal bowel sounds, without guarding, and without rebound.   Neurologic:  Alert and  oriented x4;  grossly normal neurologically.  Impression/Plan: Darren Miller is here for an endoscopy and colonoscopy  to be performed for  evaluation of hematemesis and rectal bleeding    Risks, benefits, limitations, and alternatives regarding endoscopy have been reviewed with the patient.  Questions have been answered.  All parties agreeable.   Jonathon Bellows, MD  11/19/2017, 8:45 AM

## 2017-11-19 NOTE — Anesthesia Preprocedure Evaluation (Addendum)
Anesthesia Evaluation  Patient identified by MRN, date of birth, ID band Patient awake    Reviewed: Allergy & Precautions, H&P , NPO status , Patient's Chart, lab work & pertinent test results  Airway Mallampati: III  TM Distance: >3 FB    Comment: [place]  (+) Teeth Intact   Pulmonary Current Smoker,    breath sounds clear to auscultation       Cardiovascular negative cardio ROS   Rhythm:regular Rate:Normal     Neuro/Psych negative neurological ROS  negative psych ROS   GI/Hepatic negative GI ROS, Neg liver ROS,   Endo/Other  negative endocrine ROS  Renal/GU negative Renal ROS  negative genitourinary   Musculoskeletal   Abdominal   Peds  Hematology negative hematology ROS (+)   Anesthesia Other Findings Past Medical History: No date: Allergy to alpha-gal  Past Surgical History: No date: APPENDECTOMY  BMI    Body Mass Index:  30.13 kg/m      Reproductive/Obstetrics negative OB ROS                            Anesthesia Physical Anesthesia Plan  ASA: II  Anesthesia Plan: General   Post-op Pain Management:    Induction:   PONV Risk Score and Plan: Propofol infusion and TIVA  Airway Management Planned: Natural Airway and Nasal Cannula  Additional Equipment:   Intra-op Plan:   Post-operative Plan:   Informed Consent: I have reviewed the patients History and Physical, chart, labs and discussed the procedure including the risks, benefits and alternatives for the proposed anesthesia with the patient or authorized representative who has indicated his/her understanding and acceptance.   Dental Advisory Given  Plan Discussed with: Anesthesiologist, CRNA and Surgeon  Anesthesia Plan Comments:         Anesthesia Quick Evaluation

## 2017-11-19 NOTE — Op Note (Signed)
[facility] Gastroenterology Patient Name: Darren Miller Procedure Date: 11/19/2017 8:36 AM MRN: [id number] Account #: [id number] Date of Birth: Dec 25, 1985 Admit Type: Outpatient Age: 32 Room: [facility] ENDO ROOM 1 Gender: Male Note Status: Finalized Procedure:            Colonoscopy Indications:          Rectal bleeding Providers:            Jonathon Bellows MD, MD Referring MD:         Ocie Cornfield. Ouida Sills MD, MD (Referring MD) Medicines:            Monitored Anesthesia Care Complications:        No immediate complications. Procedure:            Pre-Anesthesia Assessment:                       - Prior to the procedure, a History and Physical was                        performed, and patient medications, allergies and                        sensitivities were reviewed. The patient's tolerance of                        previous anesthesia was reviewed.                       - The risks and benefits of the procedure and the                        sedation options and risks were discussed with the                        patient. All questions were answered and informed                        consent was obtained.                       - ASA Grade Assessment: II - A patient with mild                        systemic disease.                       The Colonoscope was introduced through the and advanced                        to the the cecum, identified by the appendiceal                        orifice, IC valve and transillumination. The                        colonoscopy was performed with ease. The patient                        tolerated the procedure well. The quality of the bowel  preparation was good. Findings:      The perianal and digital rectal examinations were normal.      Non-bleeding internal hemorrhoids were found during retroflexion. The       hemorrhoids were small and Grade I (internal hemorrhoids that do not       prolapse).      The  exam was otherwise without abnormality on direct and retroflexion       views. Impression:           - Non-bleeding internal hemorrhoids.                       - The examination was otherwise normal on direct and                        retroflexion views.                       - No specimens collected. Recommendation:       - Discharge patient to home (with escort).                       - Resume previous diet.                       - Continue present medications. Procedure Code(s):    --- Professional ---                       [phone], Colonoscopy, flexible; diagnostic, including                        collection of specimen(s) by brushing or washing, when                        performed (separate procedure) Diagnosis Code(s):    --- Professional ---                       K64.0, First degree hemorrhoids                       K62.5, Hemorrhage of anus and rectum CPT copyright 2018 American Medical Association. All rights reserved. The codes documented in this report are preliminary and upon coder review may  be revised to meet current compliance requirements. Jonathon Bellows, MD Jonathon Bellows MD, MD 11/19/2017 9:12:53 AM This report has been signed electronically. Number of Addenda: 0 Note Initiated On: 11/19/2017 8:36 AM Scope Withdrawal Time: 0 hours 9 minutes 52 seconds  Total Procedure Duration: 0 hours 11 minutes 36 seconds       [facility]

## 2017-11-19 NOTE — Anesthesia Postprocedure Evaluation (Signed)
Anesthesia Post Note  Patient: Darren Miller  Procedure(s) Performed: COLONOSCOPY WITH PROPOFOL (N/A ) ESOPHAGOGASTRODUODENOSCOPY (EGD) WITH PROPOFOL (N/A )  Patient location during evaluation: PACU Anesthesia Type: General Level of consciousness: awake and alert Pain management: pain level controlled Vital Signs Assessment: post-procedure vital signs reviewed and stable Respiratory status: spontaneous breathing, nonlabored ventilation and respiratory function stable Cardiovascular status: blood pressure returned to baseline and stable Postop Assessment: no apparent nausea or vomiting Anesthetic complications: no     Last Vitals:  Vitals:   11/19/17 0940 11/19/17 0950  BP:  112/73  Pulse: (!) 59 60  Resp: 12 12  Temp:    SpO2: 100% 100%    Last Pain:  Vitals:   11/19/17 0910  TempSrc: Tympanic                 Durenda Hurt

## 2017-11-19 NOTE — Transfer of Care (Signed)
Immediate Anesthesia Transfer of Care Note  Patient: Darren Miller  Procedure(s) Performed: COLONOSCOPY WITH PROPOFOL (N/A ) ESOPHAGOGASTRODUODENOSCOPY (EGD) WITH PROPOFOL (N/A )  Patient Location: PACU and Endoscopy Unit  Anesthesia Type:General  Level of Consciousness: awake, oriented, drowsy and patient cooperative  Airway & Oxygen Therapy: Patient Spontanous Breathing  Post-op Assessment: Report given to RN, Post -op Vital signs reviewed and stable and Patient moving all extremities  Post vital signs: stable  Last Vitals:  Vitals Value Taken Time  BP 108/65 11/19/2017  9:18 AM  Temp 36.2 C 11/19/2017  9:10 AM  Pulse 75 11/19/2017  9:19 AM  Resp 20 11/19/2017  9:19 AM  SpO2 95 % 11/19/2017  9:19 AM  Vitals shown include unvalidated device data.  Last Pain:  Vitals:   11/19/17 0910  TempSrc: Tympanic      Patients Stated Pain Goal: 0 (62/69/48 [id number])  Complications: No apparent anesthesia complications

## 2017-11-19 NOTE — Telephone Encounter (Signed)
Patient's wife called and the OTC Prilosec 40 mg was way to expensive. She would like a Rx called in.  OTC only comes in 20 mg.    [facility] employee pharmacy

## 2017-11-20 ENCOUNTER — Encounter: Payer: Self-pay | Admitting: Gastroenterology

## 2018-03-25 DIAGNOSIS — L740 Miliaria rubra: Secondary | ICD-10-CM | POA: Diagnosis not present

## 2018-03-25 DIAGNOSIS — L578 Other skin changes due to chronic exposure to nonionizing radiation: Secondary | ICD-10-CM | POA: Diagnosis not present

## 2018-03-25 DIAGNOSIS — D229 Melanocytic nevi, unspecified: Secondary | ICD-10-CM | POA: Diagnosis not present

## 2018-05-17 ENCOUNTER — Encounter: Payer: Self-pay | Admitting: Emergency Medicine

## 2018-05-17 ENCOUNTER — Ambulatory Visit (INDEPENDENT_AMBULATORY_CARE_PROVIDER_SITE_OTHER): Payer: 59

## 2018-05-17 ENCOUNTER — Other Ambulatory Visit: Payer: Self-pay

## 2018-05-17 ENCOUNTER — Ambulatory Visit
Admission: EM | Admit: 2018-05-17 | Discharge: 2018-05-17 | Disposition: A | Discharge status: Home / Self Care | Payer: 59 | Attending: Family Medicine | Admitting: Family Medicine

## 2018-05-17 DIAGNOSIS — B349 Viral infection, unspecified: Secondary | ICD-10-CM

## 2018-05-17 DIAGNOSIS — R05 Cough: Secondary | ICD-10-CM | POA: Diagnosis not present

## 2018-05-17 NOTE — Discharge Instructions (Addendum)
Rest, fluids, otc meds Monitor symptoms and follow [place] DHHS guidelines (COVID 19 information given)

## 2018-05-17 NOTE — ED Provider Notes (Signed)
[facility]    CSN: [id number] Arrival date & time: 05/17/18  1438     History   Chief Complaint Chief Complaint  Patient presents with  . Cough  . Generalized Body Aches  . Chills    HPI Darren Miller is a 33 y.o. adult.   The history is provided by the patient.  Cough  Associated symptoms: no wheezing   URI  Presenting symptoms: cough and fatigue   Severity:  Moderate Onset quality:  Sudden Duration:  1 day Timing:  Constant Progression:  Unchanged Chronicity:  New Relieved by:  None tried Ineffective treatments:  None tried Associated symptoms: no wheezing   Risk factors: no recent travel     Past Medical History:  Diagnosis Date  . Allergy to alpha-gal     There are no active problems to display for this patient.   Past Surgical History:  Procedure Laterality Date  . APPENDECTOMY    . COLONOSCOPY WITH PROPOFOL N/A 11/19/2017   Procedure: COLONOSCOPY WITH PROPOFOL;  Surgeon: Jonathon Bellows, MD;  Location[facility] ENDOSCOPY;  Service: Gastroenterology;  Laterality: N/A;  . ESOPHAGOGASTRODUODENOSCOPY (EGD) WITH PROPOFOL N/A 11/19/2017   Procedure: ESOPHAGOGASTRODUODENOSCOPY (EGD) WITH PROPOFOL;  Surgeon: Jonathon Bellows, MD;  Location: [facility] ENDOSCOPY;  Service: Gastroenterology;  Laterality: N/A;       Home Medications    Prior to Admission medications   Medication Sig Start Date End Date Taking? Authorizing Provider  amphetamine-dextroamphetamine (ADDERALL) 10 MG tablet Take by mouth. 10/03/17  Yes [provider]  omeprazole (PRILOSEC) 40 MG capsule Take 1 capsule (40 mg total) by mouth daily. 11/19/17 11/19/18 Yes Jonathon Bellows, MD  ondansetron (ZOFRAN) 4 MG tablet Take 1 tablet (4 mg total) by mouth every 8 (eight) hours as needed for nausea or vomiting. Patient not taking: Reported on 11/11/2017 11/15/16   Coral Spikes, DO    Family History Family History  Problem Relation Age of Onset  . Thyroid disease Mother     Social History  Social History   Tobacco Use  . Smoking status: Light Tobacco Smoker    Types: Cigarettes  . Smokeless tobacco: Never Used  . Tobacco comment: smokes socially  Substance Use Topics  . Alcohol use: Yes    Comment: socially  . Drug use: No     Allergies   Alpha-gal   Review of Systems Review of Systems  Constitutional: Positive for fatigue.  Respiratory: Positive for cough. Negative for wheezing.      Physical Exam Triage Vital Signs ED Triage Vitals  Enc Vitals Group     BP 05/17/18 1452 (!) 133/100     Pulse Rate 05/17/18 1452 100     Resp 05/17/18 1452 18     Temp 05/17/18 1452 99.5 F (37.5 C)     Temp Source 05/17/18 1452 Oral     SpO2 05/17/18 1452 98 %     Weight 05/17/18 1449 204 lb (92.5 kg)     Height 05/17/18 1449 5\' 10"  (1.778 m)     Head Circumference --      Peak Flow --      Pain Score 05/17/18 1448 7     Pain Loc --      Pain Edu? --      Excl. in [place]? --    No data found.  Updated Vital Signs BP (!) 133/100 (BP Location: Right Arm)   Pulse 100   Temp 99.5 F (37.5 C) (Oral)   Resp 18  Ht 5\' 10"  (1.778 m)   Wt 92.5 kg   SpO2 98%   BMI 29.27 kg/m   Visual Acuity Right Eye Distance:   Left Eye Distance:   Bilateral Distance:    Right Eye Near:   Left Eye Near:    Bilateral Near:     Physical Exam Vitals signs and nursing note reviewed.  Constitutional:      General: She is not in acute distress.    Appearance: She is not toxic-appearing or diaphoretic.  Cardiovascular:     Rate and Rhythm: Tachycardia present.  Pulmonary:     Effort: Pulmonary effort is normal. No respiratory distress.  Neurological:     Mental Status: She is alert.      UC Treatments / Results  Labs (all labs ordered are listed, but only abnormal results are displayed) Labs Reviewed - No data to display  EKG None  Radiology Dg Chest 2 View  Result Date: 05/17/2018 CLINICAL DATA:  New onset cough and fever EXAM: CHEST - 2 VIEW COMPARISON:  None  FINDINGS: The heart size and mediastinal contours are within normal limits. Both lungs are clear. The visualized skeletal structures are unremarkable. IMPRESSION: No active cardiopulmonary disease. Electronically Signed   By: Kathreen Devoid   On: 05/17/2018 15:16    Procedures Procedures (including critical care time)  Medications Ordered in UC Medications - No data to display  Initial Impression / Assessment and Plan / UC Course  I have reviewed the triage vital signs and the nursing notes.  Pertinent labs & imaging results that were available during my care of the patient were reviewed by me and considered in my medical decision making (see chart for details).      Final Clinical Impressions(s) / UC Diagnoses   Final diagnoses:  Viral syndrome    ED Prescriptions    None      1. Chest x-ray result, diagnosis and potential etiologies reviewed with patient 2.. Recommend supportive treatment as above 3. Follow-up prn if symptoms worsen or don't improve   Controlled Substance Prescriptions [place] Controlled Substance Registry consulted? Not Applicable   Norval Gable, MD 05/17/18 1539

## 2018-05-17 NOTE — ED Triage Notes (Signed)
Patient c/o bodyaches, chills, cough that started yesterday. Patient has not checked his temperature.

## 2018-05-18 ENCOUNTER — Emergency Department
Admission: EM | Admit: 2018-05-18 | Discharge: 2018-05-18 | Disposition: A | Discharge status: Home / Self Care | Payer: 59 | Attending: Emergency Medicine | Admitting: Emergency Medicine

## 2018-05-18 ENCOUNTER — Other Ambulatory Visit: Payer: Self-pay

## 2018-05-18 DIAGNOSIS — Z20822 Contact with and (suspected) exposure to covid-19: Secondary | ICD-10-CM

## 2018-05-18 DIAGNOSIS — Z79899 Other long term (current) drug therapy: Secondary | ICD-10-CM | POA: Insufficient documentation

## 2018-05-18 DIAGNOSIS — R509 Fever, unspecified: Secondary | ICD-10-CM | POA: Diagnosis not present

## 2018-05-18 DIAGNOSIS — Z20818 Contact with and (suspected) exposure to other bacterial communicable diseases: Secondary | ICD-10-CM | POA: Insufficient documentation

## 2018-05-18 DIAGNOSIS — R42 Dizziness and giddiness: Secondary | ICD-10-CM | POA: Diagnosis not present

## 2018-05-18 DIAGNOSIS — Z20828 Contact with and (suspected) exposure to other viral communicable diseases: Secondary | ICD-10-CM | POA: Diagnosis not present

## 2018-05-18 DIAGNOSIS — J029 Acute pharyngitis, unspecified: Secondary | ICD-10-CM | POA: Insufficient documentation

## 2018-05-18 DIAGNOSIS — R0602 Shortness of breath: Secondary | ICD-10-CM | POA: Diagnosis not present

## 2018-05-18 DIAGNOSIS — M791 Myalgia, unspecified site: Secondary | ICD-10-CM | POA: Diagnosis not present

## 2018-05-18 DIAGNOSIS — W57XXXA Bitten or stung by nonvenomous insect and other nonvenomous arthropods, initial encounter: Secondary | ICD-10-CM

## 2018-05-18 DIAGNOSIS — R05 Cough: Secondary | ICD-10-CM | POA: Insufficient documentation

## 2018-05-18 DIAGNOSIS — R600 Localized edema: Secondary | ICD-10-CM | POA: Insufficient documentation

## 2018-05-18 DIAGNOSIS — R197 Diarrhea, unspecified: Secondary | ICD-10-CM | POA: Diagnosis not present

## 2018-05-18 DIAGNOSIS — M2550 Pain in unspecified joint: Secondary | ICD-10-CM | POA: Insufficient documentation

## 2018-05-18 DIAGNOSIS — R6889 Other general symptoms and signs: Secondary | ICD-10-CM

## 2018-05-18 DIAGNOSIS — F17210 Nicotine dependence, cigarettes, uncomplicated: Secondary | ICD-10-CM | POA: Diagnosis not present

## 2018-05-18 MED ORDER — ONDANSETRON 4 MG PO TBDP: 4.0000 mg | ORAL_TABLET | Freq: Three times a day (TID) | ORAL | 0 refills | Status: DC | PRN

## 2018-05-18 MED ORDER — LOPERAMIDE HCL 2 MG PO CAPS
2.0000 mg | ORAL_CAPSULE | Freq: Once | ORAL | Status: AC
  Administered 2018-05-18: 2 mg via ORAL
  Filled 2018-05-18: qty 1

## 2018-05-18 MED ORDER — BENZONATATE 100 MG PO CAPS: ORAL_CAPSULE | ORAL | 0 refills | Status: DC

## 2018-05-18 MED ORDER — DOXYCYCLINE HYCLATE 100 MG PO TABS: 100.0000 mg | ORAL_TABLET | Freq: Two times a day (BID) | ORAL | 0 refills | Status: AC

## 2018-05-18 MED ORDER — DOXYCYCLINE HYCLATE 100 MG PO TABS
100.0000 mg | ORAL_TABLET | Freq: Once | ORAL | Status: AC
  Administered 2018-05-18: 100 mg via ORAL
  Filled 2018-05-18: qty 1

## 2018-05-18 NOTE — ED Triage Notes (Signed)
Pt arrived via POV with possible COVID symptoms. Pt has been in contact with someone who has tested positive. Symptoms started Friday evening and pt was last in contact with positive individual on Monday. Pt is having aches, some SOB, dizziness, sore throat, minor cough, and upset stomach and diarrhea.

## 2018-05-18 NOTE — Discharge Instructions (Addendum)
You have a test pending for COVID-19. You will be notified via phone with the test results. You should remain quarantined until the results are reported and your symptoms improve.The expected duration if 14-day, for a positive test. 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 Take the Doxycycline as directed, until all pills are gone. Follow-up with your provider or [facility] for ongoing symptoms.

## 2018-05-18 NOTE — ED Provider Notes (Signed)
[facility] Emergency Department Provider Note ____________________________________________  Time seen: 2039  I have reviewed the triage vital signs and the nursing notes.  HISTORY  Chief Complaint  Fever  HPI Darren Miller is a 33 y.o. male resents himself to the ED for COVID-19 testing.  The patient has been in contact with someone who has tested positive.  He began with symptoms on Friday, and had contact with the individual on Monday.  He reports some body aches, some shortness of breath, and some dizziness.  He is also had a mild cough, sore throat, upset stomach and diarrhea.  Patient is also had some joint pain and swelling in the hands.  He also gives a concurrent history of several tick bites, concerning for our RMSF.  He has had previous [facility] spotted fever as well as alpha gal allergy.  Was evaluated yesterday at [facility] urgent care, and had a negative chest x-ray at that time.  They were unable to test for coronavirus secondary to lack of supplies.  Past Medical History:  Diagnosis Date  . Allergy to alpha-gal     There are no active problems to display for this patient.   Past Surgical History:  Procedure Laterality Date  . APPENDECTOMY    . COLONOSCOPY WITH PROPOFOL N/A 11/19/2017   Procedure: COLONOSCOPY WITH PROPOFOL;  Surgeon: Jonathon Bellows, MD;  Location: [facility] ENDOSCOPY;  Service: Gastroenterology;  Laterality: N/A;  . ESOPHAGOGASTRODUODENOSCOPY (EGD) WITH PROPOFOL N/A 11/19/2017   Procedure: ESOPHAGOGASTRODUODENOSCOPY (EGD) WITH PROPOFOL;  Surgeon: Jonathon Bellows, MD;  Location: [facility] ENDOSCOPY;  Service: Gastroenterology;  Laterality: N/A;    Prior to Admission medications   Medication Sig Start Date End Date Taking? Authorizing Provider  amphetamine-dextroamphetamine (ADDERALL) 10 MG tablet Take by mouth. 10/03/17   [provider]  benzonatate (TESSALON PERLES) 100 MG capsule Take 1-2 tabs TID prn cough 05/18/18   Akira Perusse, Dannielle Karvonen, PA-C  doxycycline (VIBRA-TABS) 100 MG tablet Take 1 tablet (100 mg total) by mouth 2 (two) times daily for 7 days. 05/18/18 05/25/18  Josseline Reddin, Dannielle Karvonen, PA-C  omeprazole (PRILOSEC) 40 MG capsule Take 1 capsule (40 mg total) by mouth daily. 11/19/17 11/19/18  Jonathon Bellows, MD  ondansetron (ZOFRAN ODT) 4 MG disintegrating tablet Take 1 tablet (4 mg total) by mouth every 8 (eight) hours as needed. 05/18/18   Matsuko Kretz, Dannielle Karvonen, PA-C  ondansetron (ZOFRAN) 4 MG tablet Take 1 tablet (4 mg total) by mouth every 8 (eight) hours as needed for nausea or vomiting. Patient not taking: Reported on 11/11/2017 11/15/16   Coral Spikes, DO    Allergies Alpha-gal  Family History  Problem Relation Age of Onset  . Thyroid disease Mother     Social History Social History   Tobacco Use  . Smoking status: Light Tobacco Smoker    Types: Cigarettes  . Smokeless tobacco: Never Used  . Tobacco comment: smokes socially  Substance Use Topics  . Alcohol use: Yes    Comment: socially  . Drug use: No    Review of Systems  Constitutional: Negative for fever. Eyes: Negative for visual changes. ENT: Negative for sore throat. Cardiovascular: Negative for chest pain. Respiratory: Positive for shortness of breath. Gastrointestinal: Positive for diarrhea. Denies abdominal pain, vomiting Genitourinary: Negative for dysuria. Musculoskeletal: Negative for back pain. Joint pains Skin: Negative for rash. Neurological: Negative for headaches, focal weakness or numbness. ____________________________________________  PHYSICAL EXAM:  VITAL SIGNS: ED Triage Vitals  Enc Vitals Group  BP 05/18/18 1936 (!) 150/91     Pulse Rate 05/18/18 1936 84     Resp 05/18/18 1936 18     Temp 05/18/18 1936 99.3 F (37.4 C)     Temp Source 05/18/18 1936 Oral     SpO2 05/18/18 1936 97 %     Weight 05/18/18 1939 202 lb (91.6 kg)     Height 05/18/18 1939 5\' 10"  (1.778 m)     Head Circumference --      Peak Flow --       Pain Score 05/18/18 1938 0     Pain Loc --      Pain Edu? --      Excl. in [place]? --     Constitutional: Alert and oriented. Well appearing and in no distress. Head: Normocephalic and atraumatic. Eyes: Conjunctivae are normal. Normal extraocular movements Nose: No congestion/rhinorrhea/epistaxis. Mouth/Throat: Mucous membranes are moist. Neck: Supple. No thyromegaly. Cardiovascular: Normal rate, regular rhythm. Normal distal pulses. Respiratory: Normal respiratory effort. No wheezes/rales/rhonchi. Musculoskeletal: Nontender with normal range of motion in all extremities.  Neurologic:  Normal gait without ataxia. Normal speech and language. No gross focal neurologic deficits are appreciated. Skin:  Skin is warm, dry and intact. No rash noted. Psychiatric: Mood and affect are normal. Patient exhibits appropriate insight and judgment. ____________________________________________   LABS (pertinent positives/negatives) Labs Reviewed  NOVEL CORONAVIRUS, NAA (HOSPITAL ORDER, SEND-OUT TO REF LAB)  ____________________________________________   RADIOLOGY  CXR (05/17/18)  Negative ____________________________________________  PROCEDURES  Procedures Doxycycline 100 mg PO Imodium 2 mg PO ____________________________________________  INITIAL IMPRESSION / ASSESSMENT AND PLAN / ED COURSE  Darren Miller was evaluated in Emergency Department on 05/18/2018 for the symptoms described in the history of present illness. He was evaluated in the context of the global COVID-19 pandemic, which necessitated consideration that the patient might be at risk for infection with the SARS-CoV-2 virus that causes COVID-19. Institutional protocols and algorithms that pertain to the evaluation of patients at risk for COVID-19 are in a state of rapid change based on information released by regulatory bodies including the CDC and federal and state organizations. These policies and algorithms were followed during  the patient's care in the ED.  Patient presents with a high risk exposure to COVID-19.  He was evaluated yesterday with a chest x-ray, presents today with ongoing symptoms.  A send out COVID-19 test was collected today.  Patient is discharged home to quarantine the pending test results.  He also had a concurrent exposure to [facility] spotted fever, which would be treated empirically with doxycycline.  Patient will follow-up with [place] urgent care or his primary provider as needed.  He will treat symptoms as discussed.  Return precautions have been reviewed.  ____________________________________________  FINAL CLINICAL IMPRESSION(S) / ED DIAGNOSES  Final diagnoses:  Suspected Covid-19 Virus Infection  Tick bite, initial encounter      Melvenia Needles, PA-C 05/18/18 2111    Carrie Mew, MD 05/19/18 [phone]

## 2018-05-20 LAB — NOVEL CORONAVIRUS, NAA (HOSP ORDER, SEND-OUT TO REF LAB; TAT 18-24 HRS): SARS-CoV-2, NAA: NOT DETECTED

## 2018-07-09 DIAGNOSIS — R7303 Prediabetes: Secondary | ICD-10-CM | POA: Diagnosis present

## 2018-07-09 DIAGNOSIS — F988 Other specified behavioral and emotional disorders with onset usually occurring in childhood and adolescence: Secondary | ICD-10-CM | POA: Diagnosis not present

## 2018-07-09 DIAGNOSIS — R7309 Other abnormal glucose: Secondary | ICD-10-CM | POA: Diagnosis not present

## 2018-07-09 DIAGNOSIS — Z0000 Encounter for general adult medical examination without abnormal findings: Secondary | ICD-10-CM | POA: Diagnosis not present

## 2018-08-11 ENCOUNTER — Other Ambulatory Visit: Payer: Self-pay

## 2018-08-11 DIAGNOSIS — Z20822 Contact with and (suspected) exposure to covid-19: Secondary | ICD-10-CM

## 2018-08-11 DIAGNOSIS — R6889 Other general symptoms and signs: Secondary | ICD-10-CM | POA: Diagnosis not present

## 2018-08-13 LAB — NOVEL CORONAVIRUS, NAA: SARS-CoV-2, NAA: NOT DETECTED

## 2018-10-14 DIAGNOSIS — Z23 Encounter for immunization: Secondary | ICD-10-CM | POA: Diagnosis not present

## 2019-03-16 ENCOUNTER — Other Ambulatory Visit: Payer: Self-pay | Admitting: Internal Medicine

## 2019-03-16 DIAGNOSIS — M25561 Pain in right knee: Secondary | ICD-10-CM | POA: Diagnosis not present

## 2019-03-16 DIAGNOSIS — Z3009 Encounter for other general counseling and advice on contraception: Secondary | ICD-10-CM | POA: Diagnosis not present

## 2019-03-23 NOTE — Progress Notes (Signed)
03/24/19 4:39 PM   Darren Miller Apr 07, 1985 [id number]  Referring provider: Kirk Ruths, MD [facility] [place],  [place] [address]  Chief Complaint  Patient presents with  . VAS Consult    HPI: 34 y.o. year old male referred for further evaluation of possible vasectomy.  He has 2 kids (2 and 45 yo) and pt reports of not being interested in having another child. He is married and reports of his wife also agreeing to vasectomy. He is a Dealer.   Denies a history of testicular trauma or pain.  No urinary issues.  No previous scrotal surgeries.  PMH: Past Medical History:  Diagnosis Date  . Allergy to alpha-gal     Surgical History: Past Surgical History:  Procedure Laterality Date  . APPENDECTOMY    . COLONOSCOPY WITH PROPOFOL N/A 11/19/2017   Procedure: COLONOSCOPY WITH PROPOFOL;  Surgeon: Jonathon Bellows, MD;  Location: [facility] ENDOSCOPY;  Service: Gastroenterology;  Laterality: N/A;  . ESOPHAGOGASTRODUODENOSCOPY (EGD) WITH PROPOFOL N/A 11/19/2017   Procedure: ESOPHAGOGASTRODUODENOSCOPY (EGD) WITH PROPOFOL;  Surgeon: Jonathon Bellows, MD;  Location: [facility] ENDOSCOPY;  Service: Gastroenterology;  Laterality: N/A;    Home Medications:  Allergies as of 03/24/2019      Reactions   Alpha-gal    Meat allergy      Medication List       Accurate as of March 24, 2019  4:39 PM. If you have any questions, ask your nurse or doctor.        STOP taking these medications   benzonatate 100 MG capsule Commonly known as: Best boy Stopped by: Hollice Espy, MD   ondansetron 4 MG disintegrating tablet Commonly known as: Zofran ODT Stopped by: Hollice Espy, MD   ondansetron 4 MG tablet Commonly known as: ZOFRAN Stopped by: Hollice Espy, MD     TAKE these medications   amphetamine-dextroamphetamine 10 MG tablet Commonly known as: ADDERALL Take by mouth.   diazepam 10 MG tablet Commonly known as: Valium Take 1 tablet (10 mg total)  by mouth once for 1 dose. Take 1 hour prior to procedure. Please have a driver available. Started by: Hollice Espy, MD   EPINEPHrine 0.3 mg/0.3 mL Soaj injection Commonly known as: EPI-PEN   omeprazole 40 MG capsule Commonly known as: PriLOSEC Take 1 capsule (40 mg total) by mouth daily.   pantoprazole 40 MG tablet Commonly known as: PROTONIX Take by mouth.       Allergies:  Allergies  Allergen Reactions  . Alpha-Gal     Meat allergy    Family History: Family History  Problem Relation Age of Onset  . Thyroid disease Mother     Social History:  reports that he has been smoking cigarettes. He has never used smokeless tobacco. He reports current alcohol use. He reports that he does not use drugs.   Physical Exam: BP 132/81   Pulse 67   Ht 5\' 10"  (1.778 m)   Wt 236 lb (107 kg)   BMI 33.86 kg/m   Constitutional:  Alert and oriented, No acute distress. HEENT: [place] AT, moist mucus membranes.  Trachea midline, no masses. Cardiovascular: No clubbing, cyanosis, or edema. Respiratory: Normal respiratory effort, no increased work of breathing. GI: Abdomen is soft, nontender, nondistended, no abdominal masses GU: Normal phallus.  Bilateral descended testicles without masses.  Vasa easily palpable bilaterally. Skin: No rashes, bruises or suspicious lesions. Neurologic: Grossly intact, no focal deficits, moving all 4 extremities. Psychiatric: Normal mood  and affect.  Laboratory Data: N/a  Urinalysis n/a  Pertinent Imaging: N/a  Assessment & Plan:    1. Vasectomy evaluation Today, we discussed what the vas deferens is, where it is located, and its function. We reviewed the procedure for vasectomy, it's risks, benefits, alternatives, and likelihood of achieving his goals. We discussed in detail the procedure, complications, and recovery as well as the need for clearance prior to unprotected intercourse. We discussed that vasectomy does not protect against sexually  transmitted diseases. We discussed that this procedure does not result in immediate sterility and that they would need to use other forms of birth control until he has been cleared with negative postvasectomy semen analyses. I explained that the procedure is considered to be permanent and that attempts at reversal have varying degrees of success. These options include vasectomy reversal, sperm retrieval, and in vitro fertilization; these can be very expensive. We discussed the chance of postvasectomy pain syndrome which occurs in less than 5% of patients. I explained to the patient that there is no treatment to resolve this chronic pain, and that if it developed I would not be able to help resolve the issue, but that surgery is generally not needed for correction. I explained there have even been reports of systemic like illness associated with this chronic pain, and that there was no good cure. I explained that vasectomy it is not a 100% reliable form of birth control, and the risk of pregnancy after vasectomy is approximately 1 in 2000 men who had a negative postvasectomy semen analysis or rare non-motile sperm. I explained that repeat vasectomy was necessary in less than 1% of vasectomy procedures when employing the type of technique that I use. I explained that he should refrain from ejaculation for approximately one week following vasectomy. I explained that there are other options for birth control which are permanent and non-permanent; we discussed these. I explained the rates of surgical complications, such as symptomatic hematoma or infection, are low (1-2%) and vary with the surgeon's experience and criteria used to diagnose the complication.   The patient had the opportunity to ask questions to his stated satisfaction. He voiced understanding of the above factors and stated that he has read all the information provided to him and the packets and informed consent.  He is interested in receiving of  Valium 10 mg prior to the procedure for the purpose of anxiolysis.  A prescription was given today.  He will have a driver on the day of the procedure.   [facility] [address], [place], [place] [address] [phone]  I, Lucas Mallow, am acting as a scribe for Dr. Hollice Espy,  I have reviewed the above documentation for accuracy and completeness, and I agree with the above.   Hollice Espy, MD

## 2019-03-24 ENCOUNTER — Ambulatory Visit (INDEPENDENT_AMBULATORY_CARE_PROVIDER_SITE_OTHER): Payer: 59 | Admitting: Urology

## 2019-03-24 ENCOUNTER — Encounter: Payer: Self-pay | Admitting: Urology

## 2019-03-24 ENCOUNTER — Other Ambulatory Visit: Payer: Self-pay

## 2019-03-24 VITALS — BP 132/81 | HR 67 | Ht 70.0 in | Wt 236.0 lb

## 2019-03-24 DIAGNOSIS — Z3009 Encounter for other general counseling and advice on contraception: Secondary | ICD-10-CM

## 2019-03-24 MED ORDER — DIAZEPAM 10 MG PO TABS: 10.0000 mg | ORAL_TABLET | Freq: Once | ORAL | 0 refills | Status: AC

## 2019-03-24 NOTE — Patient Instructions (Signed)

## 2019-03-25 ENCOUNTER — Other Ambulatory Visit: Payer: Self-pay | Admitting: Urology

## 2019-03-25 ENCOUNTER — Telehealth: Payer: Self-pay | Admitting: Urology

## 2019-03-25 DIAGNOSIS — D229 Melanocytic nevi, unspecified: Secondary | ICD-10-CM | POA: Diagnosis not present

## 2019-03-25 DIAGNOSIS — B354 Tinea corporis: Secondary | ICD-10-CM | POA: Diagnosis not present

## 2019-03-25 DIAGNOSIS — L578 Other skin changes due to chronic exposure to nonionizing radiation: Secondary | ICD-10-CM | POA: Diagnosis not present

## 2019-03-25 MED ORDER — DIAZEPAM 10 MG PO TABS: 10.0000 mg | ORAL_TABLET | Freq: Once | ORAL | 0 refills | Status: AC

## 2019-03-25 NOTE — Telephone Encounter (Signed)
Spoke with the patient today and he stated that his insurance wants him to use the [facility] and he wants to know if you can change where you sent the valium? It was sent to [facility] but it needs to go the the [facility] please. His vasectomy is 04-03-19 but I think he wanted to pick it up today.  Thanks, Sharyn Lull

## 2019-03-25 NOTE — Telephone Encounter (Signed)
Patient notified, voiced understanding.

## 2019-04-03 ENCOUNTER — Other Ambulatory Visit: Payer: Self-pay

## 2019-04-03 ENCOUNTER — Encounter: Payer: Self-pay | Admitting: Urology

## 2019-04-03 ENCOUNTER — Ambulatory Visit (INDEPENDENT_AMBULATORY_CARE_PROVIDER_SITE_OTHER): Payer: 59 | Admitting: Urology

## 2019-04-03 VITALS — BP 120/73 | HR 65 | Ht 70.0 in | Wt 230.0 lb

## 2019-04-03 DIAGNOSIS — Z302 Encounter for sterilization: Secondary | ICD-10-CM | POA: Diagnosis not present

## 2019-04-03 NOTE — Progress Notes (Signed)
04/03/19  CC:  Chief Complaint  Patient presents with  . VAS    HPI:  Blood pressure 120/73, pulse 65, height 5\' 10"  (1.778 m), weight 230 lb (104.3 kg). NED. A&Ox3.   No respiratory distress   Abd soft, NT, ND Normal external genitalia with patent urethral meatus   Bilateral Vasectomy Procedure  Pre-Procedure: - Patient's scrotum was prepped and draped for vasectomy. - The vas was palpated through the scrotal skin on the left. - 1% Xylocaine was injected into the skin and surrounding tissue for placement  - In a similar manner, the vas on the right was identified, anesthetized, and stabilized.  Procedure: - A small stab incision was created overlying the vas using a #11 blade - The left vas was isolated and brought up through the incision exposing that structure. - Bleeding points were cauterized as they occurred. - The vas was free from the surrounding structures and brought to the view. - A segment was positioned for placement with a hemostat. - A second hemostat was placed and a small segment between the two hemostats and was removed for inspection. - Each end of the transected vas lumen was fulgurated/ obliterated using needlepoint electrocautery -A fascial interposition was performed on testicular end of the vas using #3-0 chromic suture -The same procedure was performed on the right. - A single suture of #3-0 chromic catgut was used to close each lateral scrotal skin incision - A dressing was applied.  Post-Procedure: - Patient was instructed in care of the operative area - A specimen is to be delivered in 12 weeks   -Another form of contraception is to be used until post vasectomy semen analysis  Hollice Espy, MD

## 2019-04-03 NOTE — Patient Instructions (Signed)

## 2019-06-15 ENCOUNTER — Other Ambulatory Visit: Payer: Self-pay

## 2019-06-15 ENCOUNTER — Ambulatory Visit (INDEPENDENT_AMBULATORY_CARE_PROVIDER_SITE_OTHER): Payer: 59

## 2019-06-15 ENCOUNTER — Ambulatory Visit
Admission: EM | Admit: 2019-06-15 | Discharge: 2019-06-15 | Disposition: A | Discharge status: Home / Self Care | Payer: 59 | Attending: Family Medicine | Admitting: Family Medicine

## 2019-06-15 DIAGNOSIS — M25561 Pain in right knee: Secondary | ICD-10-CM | POA: Diagnosis not present

## 2019-06-15 DIAGNOSIS — S8991XA Unspecified injury of right lower leg, initial encounter: Secondary | ICD-10-CM | POA: Diagnosis not present

## 2019-06-15 MED ORDER — MELOXICAM 15 MG PO TABS: 15.0000 mg | ORAL_TABLET | Freq: Every day | ORAL | 0 refills | Status: DC | PRN

## 2019-06-15 MED ORDER — OXYCODONE-ACETAMINOPHEN 5-325 MG PO TABS: 1.0000 | ORAL_TABLET | Freq: Three times a day (TID) | ORAL | 0 refills | Status: DC | PRN

## 2019-06-15 NOTE — ED Provider Notes (Signed)
[facility] ____________________________________________  Time seen: Approximately 1130 AM  I have reviewed the triage vital signs and the nursing notes.   HISTORY  Chief Complaint Knee Pain   HPI HER GERHARDT is a 34 y.o. male presenting for evaluation of right knee pain. Reports yesterday he had been playing kickball, but reports while he was walking around he felt a sudden pop and having increased right knee pain. Reports something similar happened a few months ago and the pain got better but never fully resolved. Reports pain has been persistently worse since yesterday. States pain is with most movements as well as weightbearing. States it is hard to weight-bear fully but does not have giving way sensation. Denies continued popping or clicking. Denies pain radiation, paresthesias. Denies direct injury. Denies proximal or further distal leg pain. Reports otherwise doing well. Unresolved with over-the-counter medication.  Kirk Ruths, MD : PCP    Past Medical History:  Diagnosis Date  . Allergy to alpha-gal     There are no problems to display for this patient.   Past Surgical History:  Procedure Laterality Date  . APPENDECTOMY    . COLONOSCOPY WITH PROPOFOL N/A 11/19/2017   Procedure: COLONOSCOPY WITH PROPOFOL;  Surgeon: Jonathon Bellows, MD;  Location: [facility] ENDOSCOPY;  Service: Gastroenterology;  Laterality: N/A;  . ESOPHAGOGASTRODUODENOSCOPY (EGD) WITH PROPOFOL N/A 11/19/2017   Procedure: ESOPHAGOGASTRODUODENOSCOPY (EGD) WITH PROPOFOL;  Surgeon: Jonathon Bellows, MD;  Location: [facility] ENDOSCOPY;  Service: Gastroenterology;  Laterality: N/A;     No current facility-administered medications for this encounter.  Current Outpatient Medications:  .  amphetamine-dextroamphetamine (ADDERALL) 10 MG tablet, Take by mouth., Disp: , Rfl:  .  EPINEPHrine 0.3 mg/0.3 mL IJ SOAJ injection, , Disp: , Rfl:  .  meloxicam (MOBIC) 15 MG tablet, Take 1 tablet (15 mg total) by  mouth daily as needed., Disp: 10 tablet, Rfl: 0 .  omeprazole (PRILOSEC) 40 MG capsule, Take 1 capsule (40 mg total) by mouth daily., Disp: 90 capsule, Rfl: 3 .  oxyCODONE-acetaminophen (PERCOCET/ROXICET) 5-325 MG tablet, Take 1 tablet by mouth every 8 (eight) hours as needed for severe pain. Do not drive while taking as can cause drowsiness., Disp: 10 tablet, Rfl: 0 .  pantoprazole (PROTONIX) 40 MG tablet, Take by mouth., Disp: , Rfl:   Allergies Alpha-gal  Family History  Problem Relation Age of Onset  . Thyroid disease Mother     Social History Social History   Tobacco Use  . Smoking status: Light Tobacco Smoker    Types: Cigarettes  . Smokeless tobacco: Never Used  . Tobacco comment: smokes socially  Substance Use Topics  . Alcohol use: Yes    Comment: socially  . Drug use: No    Review of Systems Constitutional: No fever/chills Skin: Negative for rash. Neurological: Negative for focal weakness or numbness.    ____________________________________________   PHYSICAL EXAM:  VITAL SIGNS: ED Triage Vitals  Enc Vitals Group     BP 06/15/19 1029 (!) 127/94     Pulse Rate 06/15/19 1028 80     Resp 06/15/19 1028 16     Temp 06/15/19 1028 97.8 F (36.6 C)     Temp src --      SpO2 06/15/19 1028 100 %     Weight --      Height --      Head Circumference --      Peak Flow --      Pain Score 06/15/19 1027 8  Pain Loc --      Pain Edu? --      Excl. in [place]? --     Constitutional: Alert and oriented. Well appearing and in no acute distress. Eyes: Conjunctivae are normal.  ENT      Head: Normocephalic and atraumatic. Cardiovascular: Good peripheral circulation. Musculoskeletal: Gait not tested due to pain. Bilateral distal pedal pulses equal and easily palpated. Except: Right medial knee tenderness to direct palpation, mild effusion, no point bony tenderness, pain with anterior drawer test as well as medial and lateral stress, no pain with posterior drawer  test, able to fully extend as well as flex knee. Right lower extremity otherwise nontender. Neurologic:  Normal speech and language. Speech is normal. No gait instability.  Skin:  Skin is warm, dry and intact. No rash noted. Psychiatric: Mood and affect are normal. Speech and behavior are normal. Patient exhibits appropriate insight and judgment   ___________________________________________   LABS (all labs ordered are listed, but only abnormal results are displayed)  Labs Reviewed - No data to display  RADIOLOGY  DG Knee Complete 4 Views Right  Result Date: 06/15/2019 CLINICAL DATA:  Injured playing kickball yesterday.  Medial pain. EXAM: RIGHT KNEE - COMPLETE 4+ VIEW COMPARISON:  None. FINDINGS: No evidence of fracture, dislocation, or joint effusion. No evidence of arthropathy or other focal bone abnormality. Soft tissues are unremarkable. IMPRESSION: Negative. Electronically Signed   By: Kathreen Devoid   On: 06/15/2019 11:00   ____________________________________________   PROCEDURES Procedures   INITIAL IMPRESSION / ASSESSMENT AND PLAN / ED COURSE  Pertinent labs & imaging results that were available during my care of the patient were reviewed by me and considered in my medical decision making (see chart for details).  Acute right knee pain post popping pain. Right knee xray as above, negative. Concern for ligamentous meniscal injury. Right knee knee immobilizer, crutches given. Will treat with Mobic and Percocet as needed for breakthrough pain. Encouraged ice, elevation, rest, supportive care. Follow-up with orthopedic this week. Discussed follow up and return parameters including no resolution or any worsening concerns. Patient verbalized understanding and agreed to plan.   [place] controlled substance database reviewed, no recent additional breakthrough prescriptions noted. ____________________________________________   FINAL CLINICAL IMPRESSION(S) / ED  DIAGNOSES  Final diagnoses:  Acute pain of right knee     ED Discharge Orders         Ordered    meloxicam (MOBIC) 15 MG tablet  Daily PRN     06/15/19 1128    oxyCODONE-acetaminophen (PERCOCET/ROXICET) 5-325 MG tablet  Every 8 hours PRN     06/15/19 1128           Note: This dictation was prepared with Dragon dictation along with smaller phrase technology. Any transcriptional errors that result from this process are unintentional.         Marylene Land, NP 06/15/19 1401

## 2019-06-15 NOTE — ED Triage Notes (Signed)
Pt c/o right knee pain after hearing "pop" playing kickball yesterday

## 2019-06-15 NOTE — Discharge Instructions (Signed)
Take medication as prescribed. Rest. Ice. Elevate.  Use knee immobilizer and crutches.  Follow-up with orthopedic this week.  Follow up with your primary care physician this week as needed. Return to Urgent care for new or worsening concerns.

## 2019-06-18 DIAGNOSIS — M25561 Pain in right knee: Secondary | ICD-10-CM | POA: Diagnosis not present

## 2019-06-18 DIAGNOSIS — S8392XA Sprain of unspecified site of left knee, initial encounter: Secondary | ICD-10-CM | POA: Diagnosis not present

## 2019-06-18 DIAGNOSIS — S8391XA Sprain of unspecified site of right knee, initial encounter: Secondary | ICD-10-CM | POA: Diagnosis not present

## 2019-06-19 DIAGNOSIS — S8391XA Sprain of unspecified site of right knee, initial encounter: Secondary | ICD-10-CM | POA: Diagnosis not present

## 2019-06-22 DIAGNOSIS — S83231A Complex tear of medial meniscus, current injury, right knee, initial encounter: Secondary | ICD-10-CM | POA: Diagnosis not present

## 2019-07-02 ENCOUNTER — Other Ambulatory Visit: Payer: Self-pay

## 2019-07-02 DIAGNOSIS — Z302 Encounter for sterilization: Secondary | ICD-10-CM

## 2019-07-02 NOTE — Addendum Note (Signed)
Addended by: Evelina Bucy on: 07/02/2019 10:36 AM   Modules accepted: Orders

## 2019-07-03 ENCOUNTER — Encounter: Payer: Self-pay | Admitting: Urology

## 2019-07-03 ENCOUNTER — Other Ambulatory Visit: Payer: 59

## 2019-07-05 ENCOUNTER — Other Ambulatory Visit: Payer: Self-pay | Admitting: Orthopedic Surgery

## 2019-07-09 ENCOUNTER — Encounter
Admission: RE | Admit: 2019-07-09 | Discharge: 2019-07-09 | Disposition: A | Discharge status: Home / Self Care | Payer: 59 | Source: Ambulatory Visit | Attending: Orthopedic Surgery | Admitting: Orthopedic Surgery

## 2019-07-09 ENCOUNTER — Other Ambulatory Visit: Payer: Self-pay

## 2019-07-09 HISTORY — DX: Gastro-esophageal reflux disease without esophagitis: K21.9

## 2019-07-09 NOTE — Patient Instructions (Signed)
Your procedure is scheduled on: 07/16/19 Report to [place]. To find out your arrival time please call [phone] between 1PM - 3PM on 07/15/19.  Remember: Instructions that are not followed completely may result in serious medical risk, up to and including death, or upon the discretion of your surgeon and anesthesiologist your surgery may need to be rescheduled.     _X__ 1. Do not eat food after midnight the night before your procedure.                 No gum chewing or hard candies. You may drink clear liquids up to 2 hours                 before you are scheduled to arrive for your surgery- DO not drink clear                 liquids within 2 hours of the start of your surgery.                 Clear Liquids include:  water, apple juice without pulp, clear carbohydrate                 drink such as Clearfast or Gatorade, Black Coffee or Tea (Do not add                 anything to coffee or tea). Diabetics water only  __X__2.  On the morning of surgery brush your teeth with toothpaste and water, you                 may rinse your mouth with mouthwash if you wish.  Do not swallow any              toothpaste of mouthwash.     _X__ 3.  No Alcohol for 24 hours before or after surgery.   _X__ 4.  Do Not Smoke or use e-cigarettes For 24 Hours Prior to Your Surgery.                 Do not use any chewable tobacco products for at least 6 hours prior to                 surgery.  ____  5.  Bring all medications with you on the day of surgery if instructed.   __X__  6.  Notify your doctor if there is any change in your medical condition      (cold, fever, infections).     Do not wear jewelry, make-up, hairpins, clips or nail polish. Do not wear lotions, powders, or perfumes.  Do not shave 48 hours prior to surgery. Men may shave face and neck. Do not bring valuables to the hospital.    [facility] is not responsible for any belongings or  valuables.  Contacts, dentures/partials or body piercings may not be worn into surgery. Bring a case for your contacts, glasses or hearing aids, a denture cup will be supplied. Leave your suitcase in the car. After surgery it may be brought to your room. For patients admitted to the hospital, discharge time is determined by your treatment team.   Patients discharged the day of surgery will not be allowed to drive home.   Please read over the following fact sheets that you were given:   MRSA Information  __X__ Take these medicines the morning of surgery with A SIP OF WATER:  1. pantoprazole (PROTONIX) 40 MG tablet  2.   3.   4.  5.  6.  ____ Fleet Enema (as directed)   __X__ Use CHG Soap/SAGE wipes as directed  ____ Use inhalers on the day of surgery  ____ Stop metformin/Janumet/Farxiga 2 days prior to surgery    ____ Take 1/2 of usual insulin dose the night before surgery. No insulin the morning          of surgery.   ____ Stop Blood Thinners Coumadin/Plavix/Xarelto/Pleta/Pradaxa/Eliquis/Effient/Aspirin  on   Or contact your Surgeon, Cardiologist or Medical Doctor regarding  ability to stop your blood thinners  __X__ Stop Anti-inflammatories 7 days before surgery such as Advil, Ibuprofen, Motrin,  BC or Goodies Powder, Naprosyn, Naproxen, Aleve, Aspirin    __X__ Stop all herbal supplements, fish oil or vitamin E until after surgery.    ____ Bring C-Pap to the hospital.    STOP MELOXICAM/MOBIC TODAY 07/09/19 HOLD THE ADDERALL THE DAY OF SURGERY

## 2019-07-14 ENCOUNTER — Other Ambulatory Visit: Payer: Self-pay

## 2019-07-14 ENCOUNTER — Other Ambulatory Visit
Admission: RE | Admit: 2019-07-14 | Discharge: 2019-07-14 | Disposition: A | Discharge status: Home / Self Care | Payer: 59 | Source: Ambulatory Visit | Attending: Orthopedic Surgery | Admitting: Orthopedic Surgery

## 2019-07-14 DIAGNOSIS — Z01812 Encounter for preprocedural laboratory examination: Secondary | ICD-10-CM | POA: Diagnosis not present

## 2019-07-14 DIAGNOSIS — Z20822 Contact with and (suspected) exposure to covid-19: Secondary | ICD-10-CM | POA: Insufficient documentation

## 2019-07-14 LAB — BASIC METABOLIC PANEL
Anion gap: 9 (ref 5–15)
BUN: 18 mg/dL (ref 6–20)
CO2: 26 mmol/L (ref 22–32)
Calcium: 9.3 mg/dL (ref 8.9–10.3)
Chloride: 103 mmol/L (ref 98–111)
Creatinine, Ser: 0.88 mg/dL (ref 0.61–1.24)
GFR calc Af Amer: >60 mL/min (ref >60)
GFR calc non Af Amer: >60 mL/min (ref >60)
Glucose, Bld: 106 mg/dL — ABNORMAL HIGH (ref 70–99)
Potassium: 3.7 mmol/L (ref 3.5–5.1)
Sodium: 138 mmol/L (ref 135–145)

## 2019-07-14 LAB — CBC WITH DIFFERENTIAL/PLATELET
Abs Immature Granulocytes: 0.02 10*3/uL (ref 0.00–0.07)
Basophils Absolute: 0.1 10*3/uL (ref 0.0–0.1)
Basophils Relative: 1 %
Eosinophils Absolute: 0.2 10*3/uL (ref 0.0–0.5)
Eosinophils Relative: 3 %
HCT: 44.3 % (ref 39.0–52.0)
Hemoglobin: 14.8 g/dL (ref 13.0–17.0)
Immature Granulocytes: 0 %
Lymphocytes Relative: 28 %
Lymphs Abs: 1.8 10*3/uL (ref 0.7–4.0)
MCH: 28.4 pg (ref 26.0–34.0)
MCHC: 33.4 g/dL (ref 30.0–36.0)
MCV: 84.9 fL (ref 80.0–100.0)
Monocytes Absolute: 0.6 10*3/uL (ref 0.1–1.0)
Monocytes Relative: 9 %
Neutro Abs: 3.8 10*3/uL (ref 1.7–7.7)
Neutrophils Relative %: 59 %
Platelets: 297 10*3/uL (ref 150–400)
RBC: 5.22 MIL/uL (ref 4.22–5.81)
RDW: 12.7 % (ref 11.5–15.5)
WBC: 6.3 10*3/uL (ref 4.0–10.5)
nRBC: 0 % (ref 0.0–0.2)

## 2019-07-14 LAB — APTT: aPTT: 36 seconds (ref 24–36)

## 2019-07-14 LAB — SARS CORONAVIRUS 2 (TAT 6-24 HRS): SARS Coronavirus 2: NEGATIVE

## 2019-07-14 LAB — PROTIME-INR
INR: 0.9 (ref 0.8–1.2)
Prothrombin Time: 11.9 seconds (ref 11.4–15.2)

## 2019-07-16 ENCOUNTER — Ambulatory Visit
Admission: RE | Admit: 2019-07-16 | Discharge: 2019-07-16 | Disposition: A | Discharge status: Home / Self Care | Payer: 59 | Attending: Orthopedic Surgery | Admitting: Orthopedic Surgery

## 2019-07-16 ENCOUNTER — Ambulatory Visit: Payer: 59 | Admitting: Anesthesiology

## 2019-07-16 ENCOUNTER — Other Ambulatory Visit: Payer: Self-pay

## 2019-07-16 ENCOUNTER — Encounter: Payer: Self-pay | Admitting: Orthopedic Surgery

## 2019-07-16 ENCOUNTER — Encounter
Admission: RE | Disposition: A | Discharge status: Home / Self Care | Payer: Self-pay | Source: Home / Self Care | Attending: Orthopedic Surgery

## 2019-07-16 DIAGNOSIS — F17210 Nicotine dependence, cigarettes, uncomplicated: Secondary | ICD-10-CM | POA: Diagnosis not present

## 2019-07-16 DIAGNOSIS — Z791 Long term (current) use of non-steroidal anti-inflammatories (NSAID): Secondary | ICD-10-CM | POA: Insufficient documentation

## 2019-07-16 DIAGNOSIS — Y9369 Activity, other involving other sports and athletics played as a team or group: Secondary | ICD-10-CM | POA: Insufficient documentation

## 2019-07-16 DIAGNOSIS — S83241A Other tear of medial meniscus, current injury, right knee, initial encounter: Secondary | ICD-10-CM | POA: Insufficient documentation

## 2019-07-16 DIAGNOSIS — K219 Gastro-esophageal reflux disease without esophagitis: Secondary | ICD-10-CM | POA: Insufficient documentation

## 2019-07-16 DIAGNOSIS — S83231A Complex tear of medial meniscus, current injury, right knee, initial encounter: Secondary | ICD-10-CM | POA: Diagnosis not present

## 2019-07-16 DIAGNOSIS — X501XXA Overexertion from prolonged static or awkward postures, initial encounter: Secondary | ICD-10-CM | POA: Diagnosis not present

## 2019-07-16 HISTORY — PX: KNEE ARTHROSCOPY WITH MENISCAL REPAIR: SHX5653

## 2019-07-16 SURGERY — ARTHROSCOPY, KNEE, WITH MENISCUS REPAIR
Anesthesia: General | Site: Knee | Laterality: Right

## 2019-07-16 MED ORDER — ONDANSETRON HCL 4 MG PO TABS: 4.0000 mg | ORAL_TABLET | Freq: Three times a day (TID) | ORAL | 0 refills | Status: DC | PRN

## 2019-07-16 MED ORDER — CEFAZOLIN SODIUM-DEXTROSE 2-4 GM/100ML-% IV SOLN
2.0000 g | INTRAVENOUS | Status: AC
  Administered 2019-07-16: 2 g via INTRAVENOUS

## 2019-07-16 MED ORDER — CHLORHEXIDINE GLUCONATE CLOTH 2 % EX PADS: 6.0000 | MEDICATED_PAD | Freq: Once | CUTANEOUS | Status: DC

## 2019-07-16 MED ORDER — GLYCOPYRROLATE 0.2 MG/ML IJ SOLN
INTRAMUSCULAR | Status: AC
  Filled 2019-07-16: qty 1

## 2019-07-16 MED ORDER — KETOROLAC TROMETHAMINE 30 MG/ML IJ SOLN
INTRAMUSCULAR | Status: AC
  Filled 2019-07-16: qty 1

## 2019-07-16 MED ORDER — ACETAMINOPHEN 325 MG PO TABS: 325.0000 mg | ORAL_TABLET | ORAL | Status: DC | PRN

## 2019-07-16 MED ORDER — PROPOFOL 500 MG/50ML IV EMUL
INTRAVENOUS | Status: AC
  Filled 2019-07-16: qty 50

## 2019-07-16 MED ORDER — PROMETHAZINE HCL 25 MG/ML IJ SOLN: 6.2500 mg | INTRAMUSCULAR | Status: DC | PRN

## 2019-07-16 MED ORDER — DEXAMETHASONE SODIUM PHOSPHATE 10 MG/ML IJ SOLN
INTRAMUSCULAR | Status: AC
  Filled 2019-07-16: qty 1

## 2019-07-16 MED ORDER — CHLORHEXIDINE GLUCONATE 0.12 % MT SOLN
OROMUCOSAL | Status: AC
  Administered 2019-07-16: 15 mL via OROMUCOSAL
  Filled 2019-07-16: qty 15

## 2019-07-16 MED ORDER — PHENYLEPHRINE HCL (PRESSORS) 10 MG/ML IV SOLN
INTRAVENOUS | Status: DC | PRN
  Administered 2019-07-16: 100 ug via INTRAVENOUS

## 2019-07-16 MED ORDER — ACETAMINOPHEN 10 MG/ML IV SOLN
INTRAVENOUS | Status: AC
  Filled 2019-07-16: qty 100

## 2019-07-16 MED ORDER — HYDROCODONE-ACETAMINOPHEN 5-325 MG PO TABS: 1.0000 | ORAL_TABLET | ORAL | 0 refills | Status: DC | PRN

## 2019-07-16 MED ORDER — MIDAZOLAM HCL 2 MG/2ML IJ SOLN
INTRAMUSCULAR | Status: DC | PRN
  Administered 2019-07-16: 2 mg via INTRAVENOUS

## 2019-07-16 MED ORDER — CHLORHEXIDINE GLUCONATE 0.12 % MT SOLN: 15.0000 mL | Freq: Once | OROMUCOSAL | Status: AC

## 2019-07-16 MED ORDER — ONDANSETRON HCL 4 MG/2ML IJ SOLN
INTRAMUSCULAR | Status: DC | PRN
  Administered 2019-07-16: 4 mg via INTRAVENOUS

## 2019-07-16 MED ORDER — FENTANYL CITRATE (PF) 100 MCG/2ML IJ SOLN
INTRAMUSCULAR | Status: AC
  Filled 2019-07-16: qty 2

## 2019-07-16 MED ORDER — OXYCODONE HCL 5 MG PO TABS
ORAL_TABLET | ORAL | Status: AC
  Administered 2019-07-16: 5 mg via ORAL
  Filled 2019-07-16: qty 1

## 2019-07-16 MED ORDER — ASPIRIN EC 325 MG PO TBEC
325.0000 mg | DELAYED_RELEASE_TABLET | Freq: Every day | ORAL | 0 refills | Status: DC
Start: 2019-07-16 — End: 2021-02-04

## 2019-07-16 MED ORDER — CEFAZOLIN SODIUM-DEXTROSE 2-4 GM/100ML-% IV SOLN
INTRAVENOUS | Status: AC
  Filled 2019-07-16: qty 100

## 2019-07-16 MED ORDER — OXYCODONE HCL 5 MG PO TABS: 5.0000 mg | ORAL_TABLET | Freq: Once | ORAL | Status: AC | PRN

## 2019-07-16 MED ORDER — ORAL CARE MOUTH RINSE: 15.0000 mL | Freq: Once | OROMUCOSAL | Status: AC

## 2019-07-16 MED ORDER — LIDOCAINE HCL (PF) 2 % IJ SOLN
INTRAMUSCULAR | Status: AC
  Filled 2019-07-16: qty 5

## 2019-07-16 MED ORDER — FENTANYL CITRATE (PF) 100 MCG/2ML IJ SOLN
INTRAMUSCULAR | Status: DC | PRN
  Administered 2019-07-16 (×2): 50 ug via INTRAVENOUS

## 2019-07-16 MED ORDER — FENTANYL CITRATE (PF) 100 MCG/2ML IJ SOLN
INTRAMUSCULAR | Status: AC
  Administered 2019-07-16: 25 ug via INTRAVENOUS
  Filled 2019-07-16: qty 2

## 2019-07-16 MED ORDER — MEPERIDINE HCL 50 MG/ML IJ SOLN: 6.2500 mg | INTRAMUSCULAR | Status: DC | PRN

## 2019-07-16 MED ORDER — FENTANYL CITRATE (PF) 100 MCG/2ML IJ SOLN
25.0000 ug | INTRAMUSCULAR | Status: DC | PRN
  Administered 2019-07-16: 25 ug via INTRAVENOUS

## 2019-07-16 MED ORDER — BUPIVACAINE-EPINEPHRINE (PF) 0.25% -1:200000 IJ SOLN
INTRAMUSCULAR | Status: DC | PRN
  Administered 2019-07-16: 20 mL

## 2019-07-16 MED ORDER — ACETAMINOPHEN 10 MG/ML IV SOLN
INTRAVENOUS | Status: DC | PRN
  Administered 2019-07-16: 1000 mg via INTRAVENOUS

## 2019-07-16 MED ORDER — PHENYLEPHRINE HCL (PRESSORS) 10 MG/ML IV SOLN
INTRAVENOUS | Status: AC
  Filled 2019-07-16: qty 1

## 2019-07-16 MED ORDER — LACTATED RINGERS IV SOLN: INTRAVENOUS | Status: DC

## 2019-07-16 MED ORDER — ONDANSETRON HCL 4 MG/2ML IJ SOLN
INTRAMUSCULAR | Status: AC
  Filled 2019-07-16: qty 2

## 2019-07-16 MED ORDER — LIDOCAINE HCL (CARDIAC) PF 100 MG/5ML IV SOSY
PREFILLED_SYRINGE | INTRAVENOUS | Status: DC | PRN
  Administered 2019-07-16: 100 mg via INTRAVENOUS

## 2019-07-16 MED ORDER — PROPOFOL 10 MG/ML IV BOLUS
INTRAVENOUS | Status: DC | PRN
  Administered 2019-07-16: 50 mg via INTRAVENOUS
  Administered 2019-07-16: 200 mg via INTRAVENOUS

## 2019-07-16 MED ORDER — LIDOCAINE HCL (PF) 1 % IJ SOLN
INTRAMUSCULAR | Status: DC | PRN
  Administered 2019-07-16: 5 mL

## 2019-07-16 MED ORDER — ACETAMINOPHEN 160 MG/5ML PO SOLN
325.0000 mg | ORAL | Status: DC | PRN
  Filled 2019-07-16: qty 20.3

## 2019-07-16 MED ORDER — KETOROLAC TROMETHAMINE 30 MG/ML IJ SOLN
30.0000 mg | Freq: Once | INTRAMUSCULAR | Status: AC | PRN
  Administered 2019-07-16: 30 mg via INTRAVENOUS

## 2019-07-16 MED ORDER — MIDAZOLAM HCL 2 MG/2ML IJ SOLN
INTRAMUSCULAR | Status: AC
  Filled 2019-07-16: qty 2

## 2019-07-16 MED ORDER — DEXAMETHASONE SODIUM PHOSPHATE 10 MG/ML IJ SOLN
INTRAMUSCULAR | Status: DC | PRN
  Administered 2019-07-16: 10 mg via INTRAVENOUS

## 2019-07-16 MED ORDER — OXYCODONE HCL 5 MG/5ML PO SOLN: 5.0000 mg | Freq: Once | ORAL | Status: AC | PRN

## 2019-07-16 SURGICAL SUPPLY — 40 items
BUR RADIUS 3.5 (BURR) ×3 IMPLANT
BUR RADIUS 4.0X18.5 (BURR) ×3 IMPLANT
CLOSURE WOUND 1/2 X4 (GAUZE/BANDAGES/DRESSINGS) ×1
COOLER POLAR GLACIER W/PUMP (MISCELLANEOUS) ×3 IMPLANT
COVER WAND RF STERILE (DRAPES) ×3 IMPLANT
CUFF TOURN 24 STER (MISCELLANEOUS) IMPLANT
CUFF TOURN 30 STER DUAL PORT (MISCELLANEOUS) IMPLANT
CUFF TOURN SGL QUICK 34 (TOURNIQUET CUFF) ×2
CUFF TRNQT CYL 34X4.125X (TOURNIQUET CUFF) ×1 IMPLANT
DRAPE IMP U-DRAPE 54X76 (DRAPES) ×3 IMPLANT
DURAPREP 26ML APPLICATOR (WOUND CARE) ×9 IMPLANT
GAUZE SPONGE 4X4 12PLY STRL (GAUZE/BANDAGES/DRESSINGS) ×3 IMPLANT
GAUZE XEROFORM 1X8 LF (GAUZE/BANDAGES/DRESSINGS) ×3 IMPLANT
GLOVE BIOGEL PI IND STRL 9 (GLOVE) ×1 IMPLANT
GLOVE BIOGEL PI INDICATOR 9 (GLOVE) ×2
GLOVE SURG 9.0 ORTHO LTXF (GLOVE) ×6 IMPLANT
GOWN STRL REUS W/ TWL LRG LVL3 (GOWN DISPOSABLE) ×1 IMPLANT
GOWN STRL REUS W/TWL 2XL LVL3 (GOWN DISPOSABLE) ×3 IMPLANT
GOWN STRL REUS W/TWL LRG LVL3 (GOWN DISPOSABLE) ×2
IV LACTATED RINGER IRRG 3000ML (IV SOLUTION) ×8
IV LR IRRIG 3000ML ARTHROMATIC (IV SOLUTION) ×4 IMPLANT
KIT TURNOVER KIT A (KITS) ×3 IMPLANT
MANIFOLD NEPTUNE II (INSTRUMENTS) ×3 IMPLANT
MAT ABSORB  FLUID 56X50 GRAY (MISCELLANEOUS) ×2
MAT ABSORB FLUID 56X50 GRAY (MISCELLANEOUS) ×1 IMPLANT
NEEDLE HYPO 22GX1.5 SAFETY (NEEDLE) ×3 IMPLANT
PACK KNEE ARTHRO (MISCELLANEOUS) ×3 IMPLANT
PAD ABD DERMACEA PRESS 5X9 (GAUZE/BANDAGES/DRESSINGS) ×6 IMPLANT
PAD WRAPON POLAR KNEE (MISCELLANEOUS) ×1 IMPLANT
SET TUBE SUCT SHAVER OUTFL 24K (TUBING) ×3 IMPLANT
SET TUBE TIP INTRA-ARTICULAR (MISCELLANEOUS) ×3 IMPLANT
SOL PREP PVP 2OZ (MISCELLANEOUS) ×3
SOLUTION PREP PVP 2OZ (MISCELLANEOUS) ×1 IMPLANT
STRIP CLOSURE SKIN 1/2X4 (GAUZE/BANDAGES/DRESSINGS) ×2 IMPLANT
SUT ETHILON 4-0 (SUTURE) ×2
SUT ETHILON 4-0 FS2 18XMFL BLK (SUTURE) ×1
SUTURE ETHLN 4-0 FS2 18XMF BLK (SUTURE) ×1 IMPLANT
TUBING ARTHRO INFLOW-ONLY STRL (TUBING) ×3 IMPLANT
WAND HAND CNTRL MULTIVAC 90 (MISCELLANEOUS) ×3 IMPLANT
WRAPON POLAR PAD KNEE (MISCELLANEOUS) ×3

## 2019-07-16 NOTE — Op Note (Signed)
  PATIENT:  Sheppard Evens  PRE-OPERATIVE DIAGNOSIS:  TEAR OF MEDIAL MENISCUS, RIGHT KNEE  POST-OPERATIVE DIAGNOSIS:  Same  PROCEDURE:  RIGHT KNEE ARTHROSCOPY WITH  Partial MEDIAL MENISECTOMY  SURGEON:  Thornton Park, MD  ANESTHESIA:   General  PREOPERATIVE INDICATIONS:  DONTAI PEMBER  34 y.o. male with a diagnosis of TEAR OF MEDIAL MENISCUS, RIGHT KNEE who failed conservative management and elected for surgical management.    The risks benefits and alternatives were discussed with the patient preoperatively including the risks of infection, bleeding, nerve injury, knee stiffness, persistent pain, osteoarthritis and the need for further surgery. Medical  risks include DVT and pulmonary embolism, myocardial infarction, stroke, pneumonia, respiratory failure and death. The patient understood these risks and wished to proceed.  OPERATIVE FINDINGS: Right complex tear of the posterior horn and body of the medial meniscus  OPERATIVE PROCEDURE: Patient was met in the preoperative area. The operative extremity was signed with the word yes and my initials according the hospital's correct site of surgery protocol.  The patient was brought to the operating room where they was placed supine on the operative table. General anesthesia was administered. The patient was prepped and draped in a sterile fashion.  A timeout was performed to verify the patient's name, date of birth, medical record number, correct site of surgery correct procedure to be performed. It was also used to verify the patient received antibiotics that all appropriate instruments, and radiographic studies were available in the room. Once all in attendance were in agreement, the case began.  Proposed arthroscopy incisions were drawn out with a surgical marker. These were pre-injected with 1% lidocaine plain. An 11 blade was used to establish an inferior lateral and inferomedial portals. The inferomedial portal was created using a  18-gauge spinal needle under direct visualization.  A full diagnostic examination of the knee was performed including the suprapatellar pouch, patellofemoral joint, medial lateral compartments as well as the medial lateral gutters, the intercondylar notch in the posterior knee.  Findings on arthroscopy included a complex tear of the posterior horn of the medial meniscus extending into the body with a large flap component.  Patient had the medial meniscal tear treated with a 3-5 and 4-0 resector shaver blades and straight duckbill basket. The meniscus was debrided until a stable rim was achieved.  No chondral lesions or lateral meniscus tear was encountered.  ACL was intact.  There were no patellofemoral chondral lesions.  The knee was then copiously lavaged. All arthroscopic instruments were removed. The 2 arthroscopy portals were closed with 4-0 nylon suture.  Patient was injected with 20 cc of quarter percent Marcaine with epi to assist with postoperative analgesia.  Steri-Strips were applied along with a dry sterile and compressive dressing. The patient was brought to the PACU in stable condition. I was scrubbed and present for the entire case and all sharp and instrument counts were correct at the conclusion the case. I spoke with the patient's wife postoperatively to let her know the case was performed without complication and the patient was stable in the recovery room.   Timoteo Gaul, MD

## 2019-07-16 NOTE — Anesthesia Postprocedure Evaluation (Signed)
Anesthesia Post Note  Patient: Darren Miller  Procedure(s) Performed: RIGHT KNEE ARTHROSCOPY WITH PARTIAL MEDIAL MENISCECTOMY (Right Knee)  Patient location during evaluation: PACU Anesthesia Type: General Level of consciousness: awake and alert Pain management: pain level controlled Vital Signs Assessment: post-procedure vital signs reviewed and stable Respiratory status: spontaneous breathing, nonlabored ventilation and respiratory function stable Cardiovascular status: blood pressure returned to baseline and stable Postop Assessment: no apparent nausea or vomiting Anesthetic complications: no   No complications documented.   Last Vitals:  Vitals:   07/16/19 0945 07/16/19 1014  BP: 125/81 115/79  Pulse: 61 66  Resp: 12 18  Temp:  (!) 35.9 C  SpO2: 94% 99%    Last Pain:  Vitals:   07/16/19 1020  TempSrc:   PainSc: 4                  Taylor Levick Harvie Heck

## 2019-07-16 NOTE — Anesthesia Procedure Notes (Signed)
Procedure Name: LMA Insertion Date/Time: 07/16/2019 8:04 AM Performed by: Johnna Acosta, CRNA Pre-anesthesia Checklist: Patient identified, Emergency Drugs available, Patient being monitored, Suction available and Timeout performed Patient Re-evaluated:Patient Re-evaluated prior to induction Oxygen Delivery Method: Circle system utilized Preoxygenation: Pre-oxygenation with 100% oxygen Induction Type: IV induction LMA: LMA inserted LMA Size: 5.0 Tube type: Oral Airway Equipment and Method: Oral airway Placement Confirmation: positive ETCO2 and breath sounds checked- equal and bilateral Tube secured with: Tape Dental Injury: Teeth and Oropharynx as per pre-operative assessment

## 2019-07-16 NOTE — H&P (Signed)
PREOPERATIVE H&P  Chief Complaint: Complex Tear of the Medial Meniscus  HPI: Darren Miller is a 34 y.o. male who presents for preoperative history and physical with a diagnosis of Complex Tear of the Medial Meniscus.  Patient sustained the injury while playing kickball.  He sustained a twisting injury to the right knee.  Symptoms of medial joint line pain and mechanical symptoms including clicking popping are significantly impairing activities of daily living.  MRI has demonstrated a torn medial meniscus.  Given his persistent symptoms and failure to respond to nonoperative management, he wished to proceed with arthroscopic partial medial meniscectomy..   Past Medical History:  Diagnosis Date  . Allergy to alpha-gal   . GERD (gastroesophageal reflux disease)    Past Surgical History:  Procedure Laterality Date  . APPENDECTOMY    . COLONOSCOPY WITH PROPOFOL N/A 11/19/2017   Procedure: COLONOSCOPY WITH PROPOFOL;  Surgeon: Jonathon Bellows, MD;  Location: [facility] ENDOSCOPY;  Service: Gastroenterology;  Laterality: N/A;  . ESOPHAGOGASTRODUODENOSCOPY (EGD) WITH PROPOFOL N/A 11/19/2017   Procedure: ESOPHAGOGASTRODUODENOSCOPY (EGD) WITH PROPOFOL;  Surgeon: Jonathon Bellows, MD;  Location: [facility] ENDOSCOPY;  Service: Gastroenterology;  Laterality: N/A;   Social History   Socioeconomic History  . Marital status: Married    Spouse name: Not on file  . Number of children: Not on file  . Years of education: Not on file  . Highest education level: Not on file  Occupational History  . Not on file  Tobacco Use  . Smoking status: Light Tobacco Smoker    Types: Cigarettes  . Smokeless tobacco: Never Used  . Tobacco comment: smokes socially  Vaping Use  . Vaping Use: Never used  Substance and Sexual Activity  . Alcohol use: Yes    Comment: socially  . Drug use: No  . Sexual activity: Not on file  Other Topics Concern  . Not on file  Social History Narrative  . Not on file   Social Determinants of  Health   Financial Resource Strain:   . Difficulty of Paying Living Expenses:   Food Insecurity:   . Worried About Charity fundraiser in the Last Year:   . Arboriculturist in the Last Year:   Transportation Needs:   . Film/video editor (Medical):   Marland Kitchen Lack of Transportation (Non-Medical):   Physical Activity:   . Days of Exercise per Week:   . Minutes of Exercise per Session:   Stress:   . Feeling of Stress :   Social Connections:   . Frequency of Communication with Friends and Family:   . Frequency of Social Gatherings with Friends and Family:   . Attends Religious Services:   . Active Member of Clubs or Organizations:   . Attends Archivist Meetings:   Marland Kitchen Marital Status:    Family History  Problem Relation Age of Onset  . Thyroid disease Mother    Allergies  Allergen Reactions  . Alpha-Gal     Meat allergy, pt says all symptons subsided after acupuncture    Prior to Admission medications   Medication Sig Start Date End Date Taking? Authorizing Provider  amphetamine-dextroamphetamine (ADDERALL) 10 MG tablet Take 10 mg by mouth in the morning, at noon, and at bedtime.  10/03/17  Yes [provider]  meloxicam (MOBIC) 15 MG tablet Take 1 tablet (15 mg total) by mouth daily as needed. 06/15/19  Yes Marylene Land, NP  pantoprazole (PROTONIX) 40 MG tablet Take 40 mg by mouth daily.  03/16/19 03/15/20 Yes [provider]  EPINEPHrine 0.3 mg/0.3 mL IJ SOAJ injection Inject 0.3 mg into the muscle as needed for anaphylaxis.  Patient not taking: Reported on 07/16/2019 07/09/18   [provider]     Positive ROS: All other systems have been reviewed and were otherwise negative with the exception of those mentioned in the HPI and as above.  Physical Exam: General: Alert, no acute distress Cardiovascular: Regular rate and rhythm, no murmurs rubs or gallops.  No pedal edema Respiratory: Clear to auscultation bilaterally, no wheezes rales or rhonchi.  No cyanosis, no use of accessory musculature GI: No organomegaly, abdomen is soft and non-tender nondistended with positive bowel sounds. Skin: Skin intact, no lesions within the operative field. Neurologic: Sensation intact distally Psychiatric: Patient is competent for consent with normal mood and affect Lymphatic: No cervical lymphadenopathy  MUSCULOSKELETAL: Right knee: Patient has tenderness over the medial joint line.  He has a positive McMurray's test.  His range of motion remains 0 to 120 degrees.  He has no calf tenderness or lower leg edema.  He has no ligamentous laxity.  He has distally neurovascular intact.  Assessment: Complex Tear of the Medial Meniscus  Plan: Plan for Procedure(s): RIGHT KNEE ARTHROSCOPY WITH PARTIAL MEDIAL MENISCECTOMY  I reviewed the details of the operation as well as the postoperative course.  We discussed the possibility of meniscal repair if possible which would require 6 weeks of nonweightbearing on crutches.  Patient is amenable to this if the tear pattern allows for repair.  I discussed the risks and benefits of surgery. The risks include but are not limited to infection, bleeding, nerve or blood vessel injury, joint stiffness or loss of motion, persistent pain, weakness or instability, retear of the meniscus and the need for further surgery. Medical risks include but are not limited to DVT and pulmonary embolism, myocardial infarction, stroke, pneumonia, respiratory failure and death. Patient understood these risks and wished to proceed.     Thornton Park, MD   07/16/2019 7:50 AM

## 2019-07-16 NOTE — Transfer of Care (Signed)
Immediate Anesthesia Transfer of Care Note  Patient: Darren Miller  Procedure(s) Performed: RIGHT KNEE ARTHROSCOPY WITH PARTIAL MEDIAL MENISCECTOMY (Right Knee)  Patient Location: PACU  Anesthesia Type:General  Level of Consciousness: sedated  Airway & Oxygen Therapy: Patient Spontanous Breathing and Patient connected to face mask oxygen  Post-op Assessment: Report given to RN and Post -op Vital signs reviewed and stable  Post vital signs: Reviewed and stable  Last Vitals:  Vitals Value Taken Time  BP 111/64 07/16/19 0914  Temp    Pulse 72 07/16/19 0914  Resp 14 07/16/19 0914  SpO2 100 % 07/16/19 0914  Vitals shown include unvalidated device data.  Last Pain:  Vitals:   07/16/19 0610  TempSrc: Oral  PainSc: 0-No pain         Complications: No complications documented.

## 2019-07-16 NOTE — Anesthesia Preprocedure Evaluation (Addendum)
Anesthesia Evaluation  Patient identified by MRN, date of birth, ID band Patient awake    Reviewed: Allergy & Precautions, H&P , NPO status , reviewed documented beta blocker date and time   Airway Mallampati: III  TM Distance: >3 FB Neck ROM: full    Dental  (+) Teeth Intact Beard:   Pulmonary Current Smoker and Patient abstained from smoking.,    Pulmonary exam normal        Cardiovascular Normal cardiovascular exam     Neuro/Psych    GI/Hepatic GERD  Controlled,  Endo/Other    Renal/GU      Musculoskeletal   Abdominal   Peds  Hematology   Anesthesia Other Findings Past Medical History: No date: Allergy to alpha-gal No date: GERD (gastroesophageal reflux disease) Past Surgical History: No date: APPENDECTOMY 11/19/2017: COLONOSCOPY WITH PROPOFOL; N/A     Comment:  Procedure: COLONOSCOPY WITH PROPOFOL;  Surgeon: Jonathon Bellows, MD;  Location: [facility] ENDOSCOPY;  Service:               Gastroenterology;  Laterality: N/A; 11/19/2017: ESOPHAGOGASTRODUODENOSCOPY (EGD) WITH PROPOFOL; N/A     Comment:  Procedure: ESOPHAGOGASTRODUODENOSCOPY (EGD) WITH               PROPOFOL;  Surgeon: Jonathon Bellows, MD;  Location: [facility]               ENDOSCOPY;  Service: Gastroenterology;  Laterality: N/A; BMI    Body Mass Index: 31.98 kg/m     Reproductive/Obstetrics                            Anesthesia Physical Anesthesia Plan  ASA: II  Anesthesia Plan: General   Post-op Pain Management:    Induction: Intravenous  PONV Risk Score and Plan: Ondansetron and Treatment may vary due to age or medical condition  Airway Management Planned: LMA  Additional Equipment:   Intra-op Plan:   Post-operative Plan: Extubation in OR  Informed Consent: I have reviewed the patients History and Physical, chart, labs and discussed the procedure including the risks, benefits and alternatives for the proposed  anesthesia with the patient or authorized representative who has indicated his/her understanding and acceptance.     Dental Advisory Given  Plan Discussed with: CRNA  Anesthesia Plan Comments:         Anesthesia Quick Evaluation

## 2019-07-20 ENCOUNTER — Emergency Department: Payer: 59

## 2019-07-20 ENCOUNTER — Other Ambulatory Visit: Payer: Self-pay

## 2019-07-20 DIAGNOSIS — N433 Hydrocele, unspecified: Secondary | ICD-10-CM | POA: Insufficient documentation

## 2019-07-20 DIAGNOSIS — G8918 Other acute postprocedural pain: Secondary | ICD-10-CM | POA: Insufficient documentation

## 2019-07-20 DIAGNOSIS — N50811 Right testicular pain: Secondary | ICD-10-CM | POA: Diagnosis present

## 2019-07-20 DIAGNOSIS — I861 Scrotal varices: Secondary | ICD-10-CM | POA: Diagnosis not present

## 2019-07-20 DIAGNOSIS — M7989 Other specified soft tissue disorders: Secondary | ICD-10-CM | POA: Diagnosis not present

## 2019-07-20 DIAGNOSIS — I868 Varicose veins of other specified sites: Secondary | ICD-10-CM | POA: Insufficient documentation

## 2019-07-20 DIAGNOSIS — M25461 Effusion, right knee: Secondary | ICD-10-CM | POA: Diagnosis not present

## 2019-07-20 DIAGNOSIS — N503 Cyst of epididymis: Secondary | ICD-10-CM | POA: Diagnosis not present

## 2019-07-20 DIAGNOSIS — M79604 Pain in right leg: Secondary | ICD-10-CM | POA: Diagnosis not present

## 2019-07-20 DIAGNOSIS — F17210 Nicotine dependence, cigarettes, uncomplicated: Secondary | ICD-10-CM | POA: Insufficient documentation

## 2019-07-20 DIAGNOSIS — M25561 Pain in right knee: Secondary | ICD-10-CM | POA: Diagnosis not present

## 2019-07-20 LAB — CBC WITH DIFFERENTIAL/PLATELET
Abs Immature Granulocytes: 0.02 10*3/uL (ref 0.00–0.07)
Basophils Absolute: 0.1 10*3/uL (ref 0.0–0.1)
Basophils Relative: 1 %
Eosinophils Absolute: 0.3 10*3/uL (ref 0.0–0.5)
Eosinophils Relative: 3 %
HCT: 43.8 % (ref 39.0–52.0)
Hemoglobin: 15.6 g/dL (ref 13.0–17.0)
Immature Granulocytes: 0 %
Lymphocytes Relative: 27 %
Lymphs Abs: 3 10*3/uL (ref 0.7–4.0)
MCH: 29.3 pg (ref 26.0–34.0)
MCHC: 35.6 g/dL (ref 30.0–36.0)
MCV: 82.3 fL (ref 80.0–100.0)
Monocytes Absolute: 0.8 10*3/uL (ref 0.1–1.0)
Monocytes Relative: 8 %
Neutro Abs: 6.9 10*3/uL (ref 1.7–7.7)
Neutrophils Relative %: 61 %
Platelets: 311 10*3/uL (ref 150–400)
RBC: 5.32 MIL/uL (ref 4.22–5.81)
RDW: 12.9 % (ref 11.5–15.5)
WBC: 11.1 10*3/uL — ABNORMAL HIGH (ref 4.0–10.5)
nRBC: 0 % (ref 0.0–0.2)

## 2019-07-20 LAB — COMPREHENSIVE METABOLIC PANEL
ALT: 28 U/L (ref 0–44)
AST: 27 U/L (ref 15–41)
Albumin: 4.9 g/dL (ref 3.5–5.0)
Alkaline Phosphatase: 86 U/L (ref 38–126)
Anion gap: 10 (ref 5–15)
BUN: 23 mg/dL — ABNORMAL HIGH (ref 6–20)
CO2: 26 mmol/L (ref 22–32)
Calcium: 9.5 mg/dL (ref 8.9–10.3)
Chloride: 101 mmol/L (ref 98–111)
Creatinine, Ser: 1.12 mg/dL (ref 0.61–1.24)
GFR calc Af Amer: >60 mL/min (ref >60)
GFR calc non Af Amer: >60 mL/min (ref >60)
Glucose, Bld: 120 mg/dL — ABNORMAL HIGH (ref 70–99)
Potassium: 4.3 mmol/L (ref 3.5–5.1)
Sodium: 137 mmol/L (ref 135–145)
Total Bilirubin: 0.8 mg/dL (ref 0.3–1.2)
Total Protein: 8.2 g/dL — ABNORMAL HIGH (ref 6.5–8.1)

## 2019-07-20 NOTE — ED Triage Notes (Addendum)
Pt arrives to ED via POV from home with c/o post-op problem. Pt reports knee surgery on Thursday with increased pain, swelling, redness, and warmth with radiating pain into the testicles following surgery. Pt reports waiting for s/x's to improve, but sought treatment when s/x's wosened. Pt denies fever; (+) nausea, but denies vomting; no CP, SHOB, diarrhea. Pt is A&O, in NAD; FRR even, regular, and unlabored.

## 2019-07-21 ENCOUNTER — Emergency Department: Payer: 59

## 2019-07-21 ENCOUNTER — Emergency Department
Admission: EM | Admit: 2019-07-21 | Discharge: 2019-07-21 | Disposition: A | Discharge status: Home / Self Care | Payer: 59 | Attending: Emergency Medicine | Admitting: Emergency Medicine

## 2019-07-21 DIAGNOSIS — F17210 Nicotine dependence, cigarettes, uncomplicated: Secondary | ICD-10-CM | POA: Diagnosis not present

## 2019-07-21 DIAGNOSIS — I861 Scrotal varices: Secondary | ICD-10-CM | POA: Diagnosis not present

## 2019-07-21 DIAGNOSIS — N50811 Right testicular pain: Secondary | ICD-10-CM

## 2019-07-21 DIAGNOSIS — N503 Cyst of epididymis: Secondary | ICD-10-CM | POA: Diagnosis not present

## 2019-07-21 DIAGNOSIS — N433 Hydrocele, unspecified: Secondary | ICD-10-CM | POA: Diagnosis not present

## 2019-07-21 DIAGNOSIS — G8918 Other acute postprocedural pain: Secondary | ICD-10-CM

## 2019-07-21 DIAGNOSIS — M25461 Effusion, right knee: Secondary | ICD-10-CM | POA: Diagnosis not present

## 2019-07-21 DIAGNOSIS — I868 Varicose veins of other specified sites: Secondary | ICD-10-CM | POA: Diagnosis not present

## 2019-07-21 LAB — URINALYSIS, COMPLETE (UACMP) WITH MICROSCOPIC
Bacteria, UA: NONE SEEN
Bilirubin Urine: NEGATIVE
Glucose, UA: NEGATIVE mg/dL
Hgb urine dipstick: NEGATIVE
Ketones, ur: NEGATIVE mg/dL
Leukocytes,Ua: NEGATIVE
Nitrite: NEGATIVE
Protein, ur: NEGATIVE mg/dL
Specific Gravity, Urine: 1.016 (ref 1.005–1.030)
pH: 6 (ref 5.0–8.0)

## 2019-07-21 MED ORDER — OXYCODONE-ACETAMINOPHEN 5-325 MG PO TABS
1.0000 | ORAL_TABLET | Freq: Once | ORAL | Status: AC
  Administered 2019-07-21: 1 via ORAL
  Filled 2019-07-21: qty 1

## 2019-07-21 MED ORDER — OXYCODONE-ACETAMINOPHEN 5-325 MG PO TABS: 1.0000 | ORAL_TABLET | ORAL | 0 refills | Status: DC | PRN

## 2019-07-21 NOTE — Discharge Instructions (Signed)
You may switch your pain medicine to Percocet.  Elevate testicles whenever possible.  Return to the ER for worsening symptoms, persistent vomiting, fever, difficulty breathing or other concerns.

## 2019-07-21 NOTE — ED Provider Notes (Signed)
[facility] Emergency Department Provider Note   ____________________________________________   First MD Initiated Contact with Patient 07/21/19 0100     (approximate)  I have reviewed the triage vital signs and the nursing notes.   HISTORY  Chief Complaint Post-op Problem    HPI Darren Miller is a 34 y.o. male who presents to the ED from home with a chief complaint of right knee pain and swelling.  Patient had right knee arthroscopy with partial medial meniscectomy by Dr. Mack Guise on 07/16/2019.  Reports increased pain, swelling, warmth and redness over the weekend.  Has been icing it around-the-clock.  Norco prescribed for pain is not helping.  Notes whenever he stands up his right testicle feels like it is being withdrawn into his abdomen.  Denies fever, chills, cough, chest pain, shortness of breath, abdominal pain, vomiting, dysuria, diarrhea.  Sometimes pain makes him nauseous.  Denies recent travel or trauma.  Taking baby aspirin daily.       Past Medical History:  Diagnosis Date  . Allergy to alpha-gal   . GERD (gastroesophageal reflux disease)     There are no problems to display for this patient.   Past Surgical History:  Procedure Laterality Date  . APPENDECTOMY    . COLONOSCOPY WITH PROPOFOL N/A 11/19/2017   Procedure: COLONOSCOPY WITH PROPOFOL;  Surgeon: Jonathon Bellows, MD;  Location: [facility] ENDOSCOPY;  Service: Gastroenterology;  Laterality: N/A;  . ESOPHAGOGASTRODUODENOSCOPY (EGD) WITH PROPOFOL N/A 11/19/2017   Procedure: ESOPHAGOGASTRODUODENOSCOPY (EGD) WITH PROPOFOL;  Surgeon: Jonathon Bellows, MD;  Location: [facility] ENDOSCOPY;  Service: Gastroenterology;  Laterality: N/A;  . KNEE ARTHROSCOPY WITH MENISCAL REPAIR Right 07/16/2019   Procedure: RIGHT KNEE ARTHROSCOPY WITH PARTIAL MEDIAL MENISCECTOMY;  Surgeon: Thornton Park, MD;  Location: [facility] ORS;  Service: Orthopedics;  Laterality: Right;    Prior to Admission medications   Medication Sig  Start Date End Date Taking? Authorizing Provider  amphetamine-dextroamphetamine (ADDERALL) 10 MG tablet Take 10 mg by mouth in the morning, at noon, and at bedtime.  10/03/17   [provider]  aspirin EC 325 MG tablet Take 1 tablet (325 mg total) by mouth daily. 07/16/19   Thornton Park, MD  EPINEPHrine 0.3 mg/0.3 mL IJ SOAJ injection Inject 0.3 mg into the muscle as needed for anaphylaxis.  Patient not taking: Reported on 07/16/2019 07/09/18   [provider]  HYDROcodone-acetaminophen (NORCO) 5-325 MG tablet Take 1 tablet by mouth every 4 (four) hours as needed for moderate pain. 07/16/19   Thornton Park, MD  meloxicam (MOBIC) 15 MG tablet Take 1 tablet (15 mg total) by mouth daily as needed. 06/15/19   Marylene Land, NP  ondansetron (ZOFRAN) 4 MG tablet Take 1 tablet (4 mg total) by mouth every 8 (eight) hours as needed for nausea or vomiting. 07/16/19   Thornton Park, MD  oxyCODONE-acetaminophen (PERCOCET/ROXICET) 5-325 MG tablet Take 1 tablet by mouth every 4 (four) hours as needed for severe pain. 07/21/19   Paulette Blanch, MD  pantoprazole (PROTONIX) 40 MG tablet Take 40 mg by mouth daily.  03/16/19 03/15/20  [provider]    Allergies Alpha-gal  Family History  Problem Relation Age of Onset  . Thyroid disease Mother     Social History Social History   Tobacco Use  . Smoking status: Light Tobacco Smoker    Types: Cigarettes  . Smokeless tobacco: Never Used  . Tobacco comment: smokes socially  Vaping Use  . Vaping Use: Never used  Substance Use Topics  .  Alcohol use: Yes    Comment: socially  . Drug use: No    Review of Systems  Constitutional: No fever/chills Eyes: No visual changes. ENT: No sore throat. Cardiovascular: Denies chest pain. Respiratory: Denies shortness of breath. Gastrointestinal: No abdominal pain.  No nausea, no vomiting.  No diarrhea.  No constipation. Genitourinary: Positive for right testicle pain.  Negative for  dysuria. Musculoskeletal: Positive for right knee pain and swelling.  Negative for back pain. Skin: Negative for rash. Neurological: Negative for headaches, focal weakness or numbness.   ____________________________________________   PHYSICAL EXAM:  VITAL SIGNS: ED Triage Vitals [07/20/19 1923]  Enc Vitals Group     BP (!) 130/95     Pulse Rate 92     Resp 16     Temp 98.5 F (36.9 C)     Temp Source Oral     SpO2 100 %     Weight 230 lb (104.3 kg)     Height 5\' 11"  (1.803 m)     Head Circumference      Peak Flow      Pain Score 10     Pain Loc      Pain Edu?      Excl. in [place]?     Constitutional: Alert and oriented. Well appearing and in no acute distress. Eyes: Conjunctivae are normal. PERRL. EOMI. Head: Atraumatic. Nose: No congestion/rhinnorhea. Mouth/Throat: Mucous membranes are moist.   Neck: No stridor.   Cardiovascular: Normal rate, regular rhythm. Grossly normal heart sounds.  Good peripheral circulation. Respiratory: Normal respiratory effort.  No retractions. Lungs CTAB. Gastrointestinal: Soft and nontender to light or deep palpation. No distention. No abdominal bruits. No CVA tenderness. Genitourinary: Circumcised male.  Right testicle appears higher than the left, nonswollen but slightly tender to palpation.  No palpable inguinal masses.  Strong bilateral cremasteric reflexes. Musculoskeletal: Right knee swollen, incisions clean/dry/intact.  No overt warmth or erythema.  No purulence.  Calf is swollen but nontender and supple.  2+ distal pulses.  Brisk, less than 5-second capillary refill. Neurologic:  Normal speech and language. No gross focal neurologic deficits are appreciated.  Skin:  Skin is warm, dry and intact. No rash noted. Psychiatric: Mood and affect are normal. Speech and behavior are normal.  ____________________________________________   LABS (all labs ordered are listed, but only abnormal results are displayed)  Labs Reviewed   COMPREHENSIVE METABOLIC PANEL - Abnormal; Notable for the following components:      Result Value   Glucose, Bld 120 (*)    BUN 23 (*)    Total Protein 8.2 (*)    All other components within normal limits  CBC WITH DIFFERENTIAL/PLATELET - Abnormal; Notable for the following components:   WBC 11.1 (*)    All other components within normal limits  URINALYSIS, COMPLETE (UACMP) WITH MICROSCOPIC - Abnormal; Notable for the following components:   Color, Urine YELLOW (*)    APPearance CLEAR (*)    All other components within normal limits   ____________________________________________  EKG  None ____________________________________________  RADIOLOGY  ED MD interpretation: No DVT, x-rays unremarkable; bilateral hydroceles and varicoceles  Official radiology report(s): US Venous Img Lower Unilateral Right  Result Date: 07/20/2019 CLINICAL DATA:  Right lower extremity pain redness and swelling. EXAM: RIGHT LOWER EXTREMITY VENOUS DOPPLER ULTRASOUND TECHNIQUE: Gray-scale sonography with compression, as well as color and duplex ultrasound, were performed to evaluate the deep venous system(s) from the level of the common femoral vein through the popliteal and proximal calf veins.  COMPARISON:  None. FINDINGS: VENOUS Normal compressibility of the common femoral, superficial femoral, and popliteal veins, as well as the visualized calf veins. Visualized portions of profunda femoral vein and great saphenous vein unremarkable. No filling defects to suggest DVT on grayscale or color Doppler imaging. Doppler waveforms show normal direction of venous flow, normal respiratory plasticity and response to augmentation. Limited views of the contralateral common femoral vein are unremarkable. OTHER None. Limitations: none IMPRESSION: Negative. Electronically Signed   By: Virgina Norfolk M.D.   On: 07/20/2019 20:54   DG Knee Complete 4 Views Right  Result Date: 07/20/2019 CLINICAL DATA:  Postop pain, swelling  and redness. Post knee surgery 4 days ago. EXAM: RIGHT KNEE - COMPLETE 4+ VIEW COMPARISON:  None. FINDINGS: No evidence of fracture, dislocation, or joint effusion. No evidence of arthropathy or other focal bone abnormality. Possible small joint effusion. IMPRESSION: No acute bony abnormality.  Possible small joint effusion. Electronically Signed   By: Marin Olp M.D.   On: 07/20/2019 19:55   US SCROTUM W/DOPPLER  Result Date: 07/21/2019 CLINICAL DATA:  Right-sided testicular pain increased over the past 4 days EXAM: SCROTAL ULTRASOUND DOPPLER ULTRASOUND OF THE TESTICLES TECHNIQUE: Complete ultrasound examination of the testicles, epididymis, and other scrotal structures was performed. Color and spectral Doppler ultrasound were also utilized to evaluate blood flow to the testicles. COMPARISON:  None. FINDINGS: Right testicle Measurements: 4.4 x 2.1 x 2.5 cm. No mass or microlithiasis visualized. Left testicle Measurements: 4.0 x 2.5 x 2.5 cm. No mass or microlithiasis visualized. Right epididymis:  Normal in size and appearance. Left epididymis:  Small epididymal cysts are noted. Hydrocele:  Mildly complex hydroceles are noted bilaterally. Varicocele:  Bilateral varicoceles are noted as well. Pulsed Doppler interrogation of both testes demonstrates normal low resistance arterial and venous waveforms bilaterally. IMPRESSION: Normal-appearing testicles bilaterally. Bilateral hydroceles and varicoceles. Electronically Signed   By: Inez Catalina M.D.   On: 07/21/2019 02:26    ____________________________________________   PROCEDURES  Procedure(s) performed (including Critical Care):  Procedures   ____________________________________________   INITIAL IMPRESSION / ASSESSMENT AND PLAN / ED COURSE  As part of my medical decision making, I reviewed the following data within the [facility] notes reviewed and incorporated, Labs reviewed, Old chart reviewed, Radiograph reviewed,  Notes from prior ED visits and [place] Controlled Substance Database     SILUS LANZO was evaluated in Emergency Department on 07/21/2019 for the symptoms described in the history of present illness. He was evaluated in the context of the global COVID-19 pandemic, which necessitated consideration that the patient might be at risk for infection with the SARS-CoV-2 virus that causes COVID-19. Institutional protocols and algorithms that pertain to the evaluation of patients at risk for COVID-19 are in a state of rapid change based on information released by regulatory bodies including the CDC and federal and state organizations. These policies and algorithms were followed during the patient's care in the ED.    34 year old male who is 5 days status post right knee surgery presenting with increased pain, swelling and right testicle pain.  Differential diagnosis includes but is not limited to infection, DVT, hernia, epididymitis, torsion, etc.  Laboratory results unremarkable; will obtain UA.  No DVT.  Will obtain testicular ultrasound.   Clinical Course as of Jul 20 449  Tue Jul 21, 2019  0414 Updated patient on testicular ultrasound and UA result.  Will prescribe Percocet for pain control.  Patient will follow up closely with his  orthopedist.  We will also provide urology follow-up information.  Strict return precautions given.  Patient verbalizes understanding and agrees with plan of care.   [JS]    Clinical Course User Index [JS] Paulette Blanch, MD     ____________________________________________   FINAL CLINICAL IMPRESSION(S) / ED DIAGNOSES  Final diagnoses:  Post-operative pain  Hydrocele in adult  Varicocele     ED Discharge Orders         Ordered    oxyCODONE-acetaminophen (PERCOCET/ROXICET) 5-325 MG tablet  Every 4 hours PRN     Discontinue  Reprint     07/21/19 0416           Note:  This document was prepared using Dragon voice recognition software and may include  unintentional dictation errors.   Paulette Blanch, MD 07/21/19 [phone]

## 2019-07-27 DIAGNOSIS — S83242A Other tear of medial meniscus, current injury, left knee, initial encounter: Secondary | ICD-10-CM | POA: Diagnosis not present

## 2019-07-29 DIAGNOSIS — S83242A Other tear of medial meniscus, current injury, left knee, initial encounter: Secondary | ICD-10-CM | POA: Diagnosis not present

## 2019-08-03 DIAGNOSIS — S83242A Other tear of medial meniscus, current injury, left knee, initial encounter: Secondary | ICD-10-CM | POA: Diagnosis not present

## 2019-08-10 DIAGNOSIS — S83242A Other tear of medial meniscus, current injury, left knee, initial encounter: Secondary | ICD-10-CM | POA: Diagnosis not present

## 2019-08-12 DIAGNOSIS — S83242A Other tear of medial meniscus, current injury, left knee, initial encounter: Secondary | ICD-10-CM | POA: Diagnosis not present

## 2019-08-17 DIAGNOSIS — S83242A Other tear of medial meniscus, current injury, left knee, initial encounter: Secondary | ICD-10-CM | POA: Diagnosis not present

## 2019-08-19 DIAGNOSIS — S83242A Other tear of medial meniscus, current injury, left knee, initial encounter: Secondary | ICD-10-CM | POA: Diagnosis not present

## 2019-08-20 DIAGNOSIS — Z0000 Encounter for general adult medical examination without abnormal findings: Secondary | ICD-10-CM | POA: Diagnosis not present

## 2019-08-20 DIAGNOSIS — R7309 Other abnormal glucose: Secondary | ICD-10-CM | POA: Diagnosis not present

## 2019-08-20 DIAGNOSIS — F988 Other specified behavioral and emotional disorders with onset usually occurring in childhood and adolescence: Secondary | ICD-10-CM | POA: Diagnosis not present

## 2019-08-20 DIAGNOSIS — Z13220 Encounter for screening for lipoid disorders: Secondary | ICD-10-CM | POA: Diagnosis not present

## 2019-08-24 DIAGNOSIS — S83242A Other tear of medial meniscus, current injury, left knee, initial encounter: Secondary | ICD-10-CM | POA: Diagnosis not present

## 2019-08-27 DIAGNOSIS — S83242A Other tear of medial meniscus, current injury, left knee, initial encounter: Secondary | ICD-10-CM | POA: Diagnosis not present

## 2019-10-14 DIAGNOSIS — M542 Cervicalgia: Secondary | ICD-10-CM | POA: Diagnosis not present

## 2019-10-14 DIAGNOSIS — M9901 Segmental and somatic dysfunction of cervical region: Secondary | ICD-10-CM | POA: Diagnosis not present

## 2019-10-14 DIAGNOSIS — M531 Cervicobrachial syndrome: Secondary | ICD-10-CM | POA: Diagnosis not present

## 2019-10-14 DIAGNOSIS — M9902 Segmental and somatic dysfunction of thoracic region: Secondary | ICD-10-CM | POA: Diagnosis not present

## 2019-10-15 DIAGNOSIS — M9902 Segmental and somatic dysfunction of thoracic region: Secondary | ICD-10-CM | POA: Diagnosis not present

## 2019-10-15 DIAGNOSIS — M531 Cervicobrachial syndrome: Secondary | ICD-10-CM | POA: Diagnosis not present

## 2019-10-15 DIAGNOSIS — M9901 Segmental and somatic dysfunction of cervical region: Secondary | ICD-10-CM | POA: Diagnosis not present

## 2019-10-15 DIAGNOSIS — M542 Cervicalgia: Secondary | ICD-10-CM | POA: Diagnosis not present

## 2019-10-19 DIAGNOSIS — M9901 Segmental and somatic dysfunction of cervical region: Secondary | ICD-10-CM | POA: Diagnosis not present

## 2019-10-19 DIAGNOSIS — M9902 Segmental and somatic dysfunction of thoracic region: Secondary | ICD-10-CM | POA: Diagnosis not present

## 2019-10-19 DIAGNOSIS — M531 Cervicobrachial syndrome: Secondary | ICD-10-CM | POA: Diagnosis not present

## 2019-10-19 DIAGNOSIS — M542 Cervicalgia: Secondary | ICD-10-CM | POA: Diagnosis not present

## 2019-10-22 ENCOUNTER — Other Ambulatory Visit: Payer: Self-pay | Admitting: Internal Medicine

## 2019-11-17 ENCOUNTER — Other Ambulatory Visit: Payer: Self-pay | Admitting: Internal Medicine

## 2019-11-23 DIAGNOSIS — Z9889 Other specified postprocedural states: Secondary | ICD-10-CM | POA: Diagnosis not present

## 2019-12-19 ENCOUNTER — Encounter: Payer: Self-pay | Admitting: Oncology

## 2019-12-19 ENCOUNTER — Other Ambulatory Visit: Payer: Self-pay | Admitting: Oncology

## 2019-12-19 DIAGNOSIS — U071 COVID-19: Secondary | ICD-10-CM

## 2019-12-19 NOTE — Progress Notes (Signed)
I connected by phone with  Darren Miller to discuss the potential use of an new treatment for mild to moderate COVID-19 viral infection in non-hospitalized patients.   This patient is a age/sex that meets the FDA criteria for Emergency Use Authorization of casirivimab\imdevimab.  Has a (+) direct SARS-CoV-2 viral test result 1. Has mild or moderate COVID-19  2. Is ? 36 years of age and weighs ? 40 kg 3. Is NOT hospitalized due to COVID-19 4. Is NOT requiring oxygen therapy or requiring an increase in baseline oxygen flow rate due to COVID-19 5. Is within 10 days of symptom onset 6. Has at least one of the high risk factor(s) for progression to severe COVID-19 and/or hospitalization as defined in EUA. Specific high risk criteria : Past Medical History:  Diagnosis Date  . Allergy to alpha-gal   . GERD (gastroesophageal reflux disease)   ?  ? Obesity   Symptom onset  12/17/19   I have spoken and communicated the following to the patient or parent/caregiver:   1. FDA has authorized the emergency use of casirivimab\imdevimab for the treatment of mild to moderate COVID-19 in adults and pediatric patients with positive results of direct SARS-CoV-2 viral testing who are 77 years of age and older weighing at least 40 kg, and who are at high risk for progressing to severe COVID-19 and/or hospitalization.   2. The significant known and potential risks and benefits of casirivimab\imdevimab, and the extent to which such potential risks and benefits are unknown.   3. Information on available alternative treatments and the risks and benefits of those alternatives, including clinical trials.   4. Patients treated with casirivimab\imdevimab should continue to self-isolate and use infection control measures (e.g., wear mask, isolate, social distance, avoid sharing personal items, clean and disinfect "high touch" surfaces, and frequent handwashing) according to CDC guidelines.    5. The patient or  parent/caregiver has the option to accept or refuse casirivimab\imdevimab .   After reviewing this information with the patient, The patient agreed to proceed with receiving casirivimab\imdevimab infusion and will be provided a copy of the Fact sheet prior to receiving the infusion.Rulon Abide, AGNP-C [phone] ([place])

## 2019-12-22 ENCOUNTER — Ambulatory Visit (HOSPITAL_COMMUNITY)
Admission: RE | Admit: 2019-12-22 | Discharge: 2019-12-22 | Disposition: A | Discharge status: Home / Self Care | Payer: 59 | Source: Ambulatory Visit | Attending: Pulmonary Disease | Admitting: Pulmonary Disease

## 2019-12-22 DIAGNOSIS — U071 COVID-19: Secondary | ICD-10-CM | POA: Insufficient documentation

## 2019-12-22 MED ORDER — FAMOTIDINE IN NACL 20-0.9 MG/50ML-% IV SOLN: 20.0000 mg | Freq: Once | INTRAVENOUS | Status: DC | PRN

## 2019-12-22 MED ORDER — ALBUTEROL SULFATE HFA 108 (90 BASE) MCG/ACT IN AERS: 2.0000 | INHALATION_SPRAY | Freq: Once | RESPIRATORY_TRACT | Status: DC | PRN

## 2019-12-22 MED ORDER — ACETAMINOPHEN 325 MG PO TABS
650.0000 mg | ORAL_TABLET | Freq: Four times a day (QID) | ORAL | Status: DC | PRN
  Administered 2019-12-22: 650 mg via ORAL
  Filled 2019-12-22: qty 2

## 2019-12-22 MED ORDER — EPINEPHRINE 0.3 MG/0.3ML IJ SOAJ: 0.3000 mg | Freq: Once | INTRAMUSCULAR | Status: DC | PRN

## 2019-12-22 MED ORDER — DIPHENHYDRAMINE HCL 50 MG/ML IJ SOLN: 50.0000 mg | Freq: Once | INTRAMUSCULAR | Status: DC | PRN

## 2019-12-22 MED ORDER — ONDANSETRON HCL 4 MG/2ML IJ SOLN
4.0000 mg | Freq: Once | INTRAMUSCULAR | Status: AC
  Administered 2019-12-22: 4 mg via INTRAVENOUS
  Filled 2019-12-22: qty 2

## 2019-12-22 MED ORDER — SODIUM CHLORIDE 0.9 % IV BOLUS
1000.0000 mL | Freq: Once | INTRAVENOUS | Status: AC
  Administered 2019-12-22: 1000 mL via INTRAVENOUS

## 2019-12-22 MED ORDER — SODIUM CHLORIDE 0.9 % IV SOLN: Freq: Once | INTRAVENOUS | Status: AC

## 2019-12-22 MED ORDER — METHYLPREDNISOLONE SODIUM SUCC 125 MG IJ SOLR: 125.0000 mg | Freq: Once | INTRAMUSCULAR | Status: DC | PRN

## 2019-12-22 MED ORDER — SODIUM CHLORIDE 0.9 % IV SOLN: INTRAVENOUS | Status: DC | PRN

## 2019-12-22 NOTE — Discharge Instructions (Signed)
10 Things You Can Do to Manage Your COVID-19 Symptoms at Home If you have possible or confirmed COVID-19: 1. Stay home from work and school. And stay away from other public places. If you must go out, avoid using any kind of public transportation, ridesharing, or taxis. 2. Monitor your symptoms carefully. If your symptoms get worse, call your healthcare provider immediately. 3. Get rest and stay hydrated. 4. If you have a medical appointment, call the healthcare provider ahead of time and tell them that you have or may have COVID-19. 5. For medical emergencies, call 911 and notify the dispatch personnel that you have or may have COVID-19. 6. Cover your cough and sneezes with a tissue or use the inside of your elbow. 7. Wash your hands often with soap and water for at least 20 seconds or clean your hands with an alcohol-based hand sanitizer that contains at least 60% alcohol. 8. As much as possible, stay in a specific room and away from other people in your home. Also, you should use a separate bathroom, if available. If you need to be around other people in or outside of the home, wear a mask. 9. Avoid sharing personal items with other people in your household, like dishes, towels, and bedding. 10. Clean all surfaces that are touched often, like counters, tabletops, and doorknobs. Use household cleaning sprays or wipes according to the label instructions. cdc.gov/coronavirus 07/16/2018 This information is not intended to replace advice given to you by your health care provider. Make sure you discuss any questions you have with your health care provider. Document Revised: 12/18/2018 Document Reviewed: 12/18/2018 Elsevier Patient Education  2020 Elsevier Inc. What types of side effects do monoclonal antibody drugs cause?  Common side effects  In general, the more common side effects caused by monoclonal antibody drugs include: . Allergic reactions, such as hives or itching . Flu-like signs and  symptoms, including chills, fatigue, fever, and muscle aches and pains . Nausea, vomiting . Diarrhea . Skin rashes . Low blood pressure   The CDC is recommending patients who receive monoclonal antibody treatments wait at least 90 days before being vaccinated.  Currently, there are no data on the safety and efficacy of mRNA COVID-19 vaccines in persons who received monoclonal antibodies or convalescent plasma as part of COVID-19 treatment. Based on the estimated half-life of such therapies as well as evidence suggesting that reinfection is uncommon in the 90 days after initial infection, vaccination should be deferred for at least 90 days, as a precautionary measure until additional information becomes available, to avoid interference of the antibody treatment with vaccine-induced immune responses. If you have any questions or concerns after the infusion please call the Advanced Practice Provider on call at [phone]. This number is ONLY intended for your use regarding questions or concerns about the infusion post-treatment side-effects.  Please do not provide this number to others for use. For return to work notes please contact your primary care provider.   If someone you know is interested in receiving treatment please have them call the COVID hotline at [phone].   

## 2019-12-22 NOTE — Progress Notes (Addendum)
Patient reviewed Fact Sheet for Patients, Parents, and Caregivers for Emergency Use Authorization (EUA) of casirivimab/imdevimab  for the Treatment of Coronavirus.  Patient also reviewed and is agreeable to the estimated cost of treatment.  Patient is agreeable to proceed.

## 2019-12-22 NOTE — Progress Notes (Addendum)
  Diagnosis: COVID-19  Physician: Dr. Asencion Noble   Procedure:Medication fact sheet provided to patient; all questions answered.  Allergies reviewed with patient.  IV placed. Casirivimab/imdevimab   administered via IV infusion.  Note: Fever improved; pt will take ibuprofen at home and alternate with Tylenol.  Complications: No immediate complications noted.  Discharge: Discharged home   Monna Fam 12/22/2019

## 2019-12-24 ENCOUNTER — Other Ambulatory Visit (HOSPITAL_COMMUNITY): Payer: Self-pay

## 2019-12-29 ENCOUNTER — Other Ambulatory Visit: Payer: Self-pay | Admitting: Internal Medicine

## 2020-01-26 ENCOUNTER — Other Ambulatory Visit: Payer: Self-pay | Admitting: Internal Medicine

## 2020-02-23 ENCOUNTER — Other Ambulatory Visit: Payer: Self-pay | Admitting: Internal Medicine

## 2020-03-07 ENCOUNTER — Other Ambulatory Visit: Payer: Self-pay | Admitting: Internal Medicine

## 2020-03-23 ENCOUNTER — Other Ambulatory Visit: Payer: Self-pay

## 2020-03-29 ENCOUNTER — Other Ambulatory Visit: Payer: Self-pay | Admitting: Internal Medicine

## 2020-05-04 ENCOUNTER — Other Ambulatory Visit: Payer: Self-pay

## 2020-05-04 MED ORDER — AMPHETAMINE-DEXTROAMPHETAMINE 20 MG PO TABS
20.0000 mg | ORAL_TABLET | Freq: Two times a day (BID) | ORAL | 0 refills | Status: DC
  Filled 2020-05-04: qty 60, 30d supply, fill #0

## 2020-05-04 MED FILL — Famotidine Tab 20 MG: ORAL | 30 days supply | Qty: 60 | Fill #0 | Status: AC

## 2020-05-04 MED FILL — Tadalafil Tab 5 MG: ORAL | 30 days supply | Qty: 30 | Fill #0 | Status: AC

## 2020-05-05 ENCOUNTER — Other Ambulatory Visit: Payer: Self-pay

## 2020-05-20 ENCOUNTER — Other Ambulatory Visit: Payer: Self-pay

## 2020-05-20 MED ORDER — MELOXICAM 15 MG PO TABS
ORAL_TABLET | ORAL | 2 refills | Status: DC
  Filled 2020-05-20 – 2020-07-11 (×2): qty 30, 30d supply, fill #0

## 2020-05-31 ENCOUNTER — Other Ambulatory Visit: Payer: Self-pay

## 2020-06-03 ENCOUNTER — Other Ambulatory Visit: Payer: Self-pay

## 2020-06-03 MED ORDER — AMPHETAMINE-DEXTROAMPHETAMINE 20 MG PO TABS
20.0000 mg | ORAL_TABLET | Freq: Two times a day (BID) | ORAL | 0 refills | Status: DC
  Filled 2020-06-03: qty 60, 30d supply, fill #0

## 2020-06-03 MED FILL — Tadalafil Tab 5 MG: ORAL | 30 days supply | Qty: 30 | Fill #1 | Status: AC

## 2020-06-03 MED FILL — Famotidine Tab 20 MG: ORAL | 30 days supply | Qty: 60 | Fill #1 | Status: AC

## 2020-06-07 ENCOUNTER — Other Ambulatory Visit: Payer: Self-pay

## 2020-06-15 ENCOUNTER — Other Ambulatory Visit: Payer: Self-pay | Admitting: Orthopaedic Surgery

## 2020-06-15 DIAGNOSIS — S66412A Strain of intrinsic muscle, fascia and tendon of left thumb at wrist and hand level, initial encounter: Secondary | ICD-10-CM

## 2020-06-27 ENCOUNTER — Other Ambulatory Visit: Payer: Self-pay

## 2020-06-27 ENCOUNTER — Ambulatory Visit
Admission: RE | Admit: 2020-06-27 | Discharge: 2020-06-27 | Disposition: A | Discharge status: Home / Self Care | Payer: PRIVATE HEALTH INSURANCE | Source: Ambulatory Visit | Attending: Orthopaedic Surgery | Admitting: Orthopaedic Surgery

## 2020-06-27 DIAGNOSIS — S66412A Strain of intrinsic muscle, fascia and tendon of left thumb at wrist and hand level, initial encounter: Secondary | ICD-10-CM | POA: Insufficient documentation

## 2020-07-11 ENCOUNTER — Other Ambulatory Visit: Payer: Self-pay

## 2020-07-11 MED ORDER — AMPHETAMINE-DEXTROAMPHETAMINE 20 MG PO TABS
20.0000 mg | ORAL_TABLET | Freq: Two times a day (BID) | ORAL | 0 refills | Status: DC
  Filled 2020-07-11: qty 60, 30d supply, fill #0

## 2020-07-11 MED ORDER — PANTOPRAZOLE SODIUM 40 MG PO TBEC
DELAYED_RELEASE_TABLET | ORAL | 1 refills | Status: DC
  Filled 2020-07-11: qty 90, 90d supply, fill #0
  Filled 2020-10-12: qty 90, 90d supply, fill #1

## 2020-07-11 MED FILL — Famotidine Tab 20 MG: ORAL | 30 days supply | Qty: 60 | Fill #2 | Status: AC

## 2020-07-11 MED FILL — Tadalafil Tab 5 MG: ORAL | 30 days supply | Qty: 30 | Fill #2 | Status: AC

## 2020-07-14 ENCOUNTER — Telehealth: Payer: Self-pay

## 2020-07-14 ENCOUNTER — Other Ambulatory Visit: Payer: Self-pay

## 2020-07-14 DIAGNOSIS — Z302 Encounter for sterilization: Secondary | ICD-10-CM

## 2020-07-14 NOTE — Telephone Encounter (Signed)
Informed patient of the message below, patient agrees to drop off semen for analysis. Cups placed up front for patient pickup and lab appt scheduled.

## 2020-07-14 NOTE — Telephone Encounter (Signed)
-----   Message from Debroah Loop, [place] sent at 07/12/2020  4:05 PM EDT ----- Regarding: FW: Cancellation of Order # [id number] Patient underwent vasectomy in March 2021 and never completed a post-vas semen analysis. Please contact him and schedule him for a lab drop off. His vasectomy is NOT considered definitive contraception until it is verified to be effective on semen analysis. He should continue using backup birth control until we receive these results. ----- Message ----- From: Armando Gang Job Sent: 07/06/2020   5:34 AM EDT To: Debroah Loop, PA-C Subject: Cancellation of Order # [id number]              Order number [id number] for the procedure POST-VAS SPERM  EVALUATION,QUAL [[id number]] has been canceled by [employer] [facility]  [RTEBATCH]. This procedure was ordered by Debroah Loop,  PA-C [[id number]] on Jul 02, 2019 for the patient Darren Miller [[id number]]. The reason for cancellation was "Order  Expired".  This was a future order.

## 2020-08-01 ENCOUNTER — Other Ambulatory Visit: Payer: Self-pay

## 2020-08-01 ENCOUNTER — Encounter: Payer: Self-pay | Admitting: Urology

## 2020-08-10 ENCOUNTER — Other Ambulatory Visit: Payer: Self-pay

## 2020-08-10 MED FILL — Famotidine Tab 20 MG: ORAL | 30 days supply | Qty: 60 | Fill #3 | Status: AC

## 2020-08-11 ENCOUNTER — Other Ambulatory Visit: Payer: Self-pay

## 2020-08-11 MED ORDER — AMPHETAMINE-DEXTROAMPHETAMINE 20 MG PO TABS
20.0000 mg | ORAL_TABLET | Freq: Two times a day (BID) | ORAL | 0 refills | Status: DC
  Filled 2020-08-11: qty 60, 30d supply, fill #0

## 2020-08-11 MED FILL — Tadalafil Tab 5 MG: ORAL | 30 days supply | Qty: 30 | Fill #3 | Status: AC

## 2020-09-09 ENCOUNTER — Other Ambulatory Visit: Payer: Self-pay

## 2020-09-09 MED ORDER — AMPHETAMINE-DEXTROAMPHETAMINE 20 MG PO TABS
20.0000 mg | ORAL_TABLET | Freq: Two times a day (BID) | ORAL | 0 refills | Status: DC
  Filled 2020-09-09: qty 60, 30d supply, fill #0

## 2020-09-09 MED ORDER — TRAZODONE HCL 50 MG PO TABS
ORAL_TABLET | ORAL | 11 refills | Status: DC
  Filled 2020-09-09: qty 60, 30d supply, fill #0
  Filled 2020-10-12: qty 60, 30d supply, fill #1

## 2020-09-28 ENCOUNTER — Other Ambulatory Visit: Payer: Self-pay

## 2020-10-12 ENCOUNTER — Other Ambulatory Visit: Payer: Self-pay

## 2020-10-12 MED ORDER — AMPHETAMINE-DEXTROAMPHETAMINE 20 MG PO TABS
20.0000 mg | ORAL_TABLET | Freq: Two times a day (BID) | ORAL | 0 refills | Status: DC
  Filled 2020-10-12: qty 60, 30d supply, fill #0

## 2020-10-12 MED FILL — Famotidine Tab 20 MG: ORAL | 30 days supply | Qty: 60 | Fill #4 | Status: AC

## 2020-10-12 MED FILL — Tadalafil Tab 5 MG: ORAL | 30 days supply | Qty: 30 | Fill #4 | Status: AC

## 2020-10-13 ENCOUNTER — Other Ambulatory Visit: Payer: Self-pay

## 2020-11-10 ENCOUNTER — Other Ambulatory Visit: Payer: Self-pay

## 2020-11-10 MED ORDER — AMPHETAMINE-DEXTROAMPHETAMINE 20 MG PO TABS
20.0000 mg | ORAL_TABLET | Freq: Two times a day (BID) | ORAL | 0 refills | Status: DC
  Filled 2020-11-10: qty 60, 30d supply, fill #0

## 2020-11-10 MED ORDER — PANTOPRAZOLE SODIUM 40 MG PO TBEC
DELAYED_RELEASE_TABLET | ORAL | 1 refills | Status: DC
  Filled 2020-11-10 – 2021-01-17 (×2): qty 90, 90d supply, fill #0

## 2020-11-10 MED FILL — Famotidine Tab 20 MG: ORAL | 30 days supply | Qty: 60 | Fill #5 | Status: AC

## 2020-11-18 ENCOUNTER — Other Ambulatory Visit: Payer: Self-pay

## 2020-11-24 ENCOUNTER — Other Ambulatory Visit: Payer: Self-pay

## 2020-12-06 ENCOUNTER — Other Ambulatory Visit: Payer: Self-pay

## 2020-12-06 MED ORDER — AMPHETAMINE-DEXTROAMPHETAMINE 20 MG PO TABS
20.0000 mg | ORAL_TABLET | Freq: Two times a day (BID) | ORAL | 0 refills | Status: DC
  Filled 2020-12-09: qty 60, 30d supply, fill #0

## 2020-12-06 MED FILL — Famotidine Tab 20 MG: ORAL | 30 days supply | Qty: 60 | Fill #6 | Status: AC

## 2020-12-07 ENCOUNTER — Other Ambulatory Visit: Payer: Self-pay

## 2020-12-09 ENCOUNTER — Other Ambulatory Visit: Payer: Self-pay

## 2021-01-17 ENCOUNTER — Other Ambulatory Visit: Payer: Self-pay

## 2021-01-18 ENCOUNTER — Other Ambulatory Visit: Payer: Self-pay

## 2021-01-18 MED ORDER — AMPHETAMINE-DEXTROAMPHETAMINE 20 MG PO TABS
20.0000 mg | ORAL_TABLET | Freq: Two times a day (BID) | ORAL | 0 refills | Status: DC
  Filled 2021-01-18: qty 60, 30d supply, fill #0

## 2021-01-30 ENCOUNTER — Other Ambulatory Visit: Payer: Self-pay

## 2021-01-30 ENCOUNTER — Ambulatory Visit (INDEPENDENT_AMBULATORY_CARE_PROVIDER_SITE_OTHER): Payer: No Typology Code available for payment source

## 2021-01-30 ENCOUNTER — Ambulatory Visit
Admission: EM | Admit: 2021-01-30 | Discharge: 2021-01-30 | Disposition: A | Discharge status: Home / Self Care | Payer: No Typology Code available for payment source | Attending: Emergency Medicine | Admitting: Emergency Medicine

## 2021-01-30 DIAGNOSIS — Z8616 Personal history of COVID-19: Secondary | ICD-10-CM | POA: Insufficient documentation

## 2021-01-30 DIAGNOSIS — M79672 Pain in left foot: Secondary | ICD-10-CM | POA: Insufficient documentation

## 2021-01-30 DIAGNOSIS — R6883 Chills (without fever): Secondary | ICD-10-CM

## 2021-01-30 DIAGNOSIS — R079 Chest pain, unspecified: Secondary | ICD-10-CM

## 2021-01-30 DIAGNOSIS — R0789 Other chest pain: Secondary | ICD-10-CM | POA: Insufficient documentation

## 2021-01-30 DIAGNOSIS — M25562 Pain in left knee: Secondary | ICD-10-CM | POA: Diagnosis not present

## 2021-01-30 DIAGNOSIS — R61 Generalized hyperhidrosis: Secondary | ICD-10-CM | POA: Insufficient documentation

## 2021-01-30 DIAGNOSIS — R630 Anorexia: Secondary | ICD-10-CM

## 2021-01-30 DIAGNOSIS — Z20822 Contact with and (suspected) exposure to covid-19: Secondary | ICD-10-CM | POA: Insufficient documentation

## 2021-01-30 DIAGNOSIS — R0602 Shortness of breath: Secondary | ICD-10-CM

## 2021-01-30 DIAGNOSIS — M549 Dorsalgia, unspecified: Secondary | ICD-10-CM | POA: Diagnosis not present

## 2021-01-30 DIAGNOSIS — M25561 Pain in right knee: Secondary | ICD-10-CM | POA: Insufficient documentation

## 2021-01-30 DIAGNOSIS — H538 Other visual disturbances: Secondary | ICD-10-CM | POA: Diagnosis not present

## 2021-01-30 DIAGNOSIS — M7918 Myalgia, other site: Secondary | ICD-10-CM | POA: Insufficient documentation

## 2021-01-30 DIAGNOSIS — M79671 Pain in right foot: Secondary | ICD-10-CM | POA: Diagnosis not present

## 2021-01-30 LAB — COMPREHENSIVE METABOLIC PANEL
ALT: 36 U/L (ref 0–44)
AST: 35 U/L (ref 15–41)
Albumin: 4.7 g/dL (ref 3.5–5.0)
Alkaline Phosphatase: 81 U/L (ref 38–126)
Anion gap: 10 (ref 5–15)
BUN: 16 mg/dL (ref 6–20)
CO2: 25 mmol/L (ref 22–32)
Calcium: 9.5 mg/dL (ref 8.9–10.3)
Chloride: 100 mmol/L (ref 98–111)
Creatinine, Ser: 1.26 mg/dL — ABNORMAL HIGH (ref 0.61–1.24)
GFR, Estimated: >60 mL/min (ref >60)
Glucose, Bld: 113 mg/dL — ABNORMAL HIGH (ref 70–99)
Potassium: 3.9 mmol/L (ref 3.5–5.1)
Sodium: 135 mmol/L (ref 135–145)
Total Bilirubin: 0.5 mg/dL (ref 0.3–1.2)
Total Protein: 8.5 g/dL — ABNORMAL HIGH (ref 6.5–8.1)

## 2021-01-30 LAB — CBC WITH DIFFERENTIAL/PLATELET
Abs Immature Granulocytes: 0.01 10*3/uL (ref 0.00–0.07)
Basophils Absolute: 0 10*3/uL (ref 0.0–0.1)
Basophils Relative: 1 %
Eosinophils Absolute: 0 10*3/uL (ref 0.0–0.5)
Eosinophils Relative: 0 %
HCT: 48.2 % (ref 39.0–52.0)
Hemoglobin: 16.3 g/dL (ref 13.0–17.0)
Immature Granulocytes: 0 %
Lymphocytes Relative: 18 %
Lymphs Abs: 0.8 10*3/uL (ref 0.7–4.0)
MCH: 28.8 pg (ref 26.0–34.0)
MCHC: 33.8 g/dL (ref 30.0–36.0)
MCV: 85.2 fL (ref 80.0–100.0)
Monocytes Absolute: 0.4 10*3/uL (ref 0.1–1.0)
Monocytes Relative: 8 %
Neutro Abs: 3.3 10*3/uL (ref 1.7–7.7)
Neutrophils Relative %: 73 %
Platelets: 244 10*3/uL (ref 150–400)
RBC: 5.66 MIL/uL (ref 4.22–5.81)
RDW: 13 % (ref 11.5–15.5)
WBC: 4.5 10*3/uL (ref 4.0–10.5)
nRBC: 0 % (ref 0.0–0.2)

## 2021-01-30 LAB — RESP PANEL BY RT-PCR (FLU A&B, COVID) ARPGX2
Influenza A by PCR: NEGATIVE
Influenza B by PCR: NEGATIVE
SARS Coronavirus 2 by RT PCR: NEGATIVE

## 2021-01-30 LAB — GLUCOSE, CAPILLARY: Glucose-Capillary: 95 mg/dL (ref 70–99)

## 2021-01-30 LAB — TSH: TSH: 0.701 u[IU]/mL (ref 0.350–4.500)

## 2021-01-30 LAB — D-DIMER, QUANTITATIVE: D-Dimer, Quant: 1.78 ug/mL-FEU — ABNORMAL HIGH (ref 0.00–0.50)

## 2021-01-30 MED ORDER — CYCLOBENZAPRINE HCL 10 MG PO TABS
10.0000 mg | ORAL_TABLET | Freq: Two times a day (BID) | ORAL | 0 refills | Status: DC | PRN
  Filled 2021-01-30: qty 20, 10d supply, fill #0

## 2021-01-30 MED ORDER — TRAMADOL HCL 50 MG PO TABS
50.0000 mg | ORAL_TABLET | Freq: Four times a day (QID) | ORAL | 0 refills | Status: DC | PRN
  Filled 2021-01-30: qty 15, 4d supply, fill #0

## 2021-01-30 NOTE — ED Provider Notes (Addendum)
[facility]    CSN: [id number] Arrival date & time: 01/30/21  0847      History   Chief Complaint Chief Complaint  Patient presents with   Back Pain   Knee Pain   Shortness of Breath    HPI Darren Miller is a 36 y.o. male.   Patient presents with chills, right-sided chest pain, intermittent shortness of breath, blurred vision, bilateral lower back pain, bilateral knee pain, bilateral foot pain, diaphoresis and decreased appetite for 4 days.  Associated metallic taste in mouth.  Symptoms worsening on day 2 primarily the musculoskeletal pain and diaphoresis.  Pain in the chest does not radiate, described as a dull constant.  Can be felt minimally with breathing.  Not worsened by movement.  Shortness of breath occurring with mild exertion, resolves with rest.  Has attempted use of tepid baths for management of pain which have been minimally effective.  No known sick contacts.  No other members of household is sick.  Denies cardiac history or familial history.  COVID 1 month ago.  Denies recent travel, dietary changes.    Past Medical History:  Diagnosis Date   Allergy to alpha-gal    GERD (gastroesophageal reflux disease)     There are no problems to display for this patient.   Past Surgical History:  Procedure Laterality Date   APPENDECTOMY     COLONOSCOPY WITH PROPOFOL N/A 11/19/2017   Procedure: COLONOSCOPY WITH PROPOFOL;  Surgeon: Jonathon Bellows, MD;  Location: [facility] ENDOSCOPY;  Service: Gastroenterology;  Laterality: N/A;   ESOPHAGOGASTRODUODENOSCOPY (EGD) WITH PROPOFOL N/A 11/19/2017   Procedure: ESOPHAGOGASTRODUODENOSCOPY (EGD) WITH PROPOFOL;  Surgeon: Jonathon Bellows, MD;  Location: [facility] ENDOSCOPY;  Service: Gastroenterology;  Laterality: N/A;   KNEE ARTHROSCOPY WITH MENISCAL REPAIR Right 07/16/2019   Procedure: RIGHT KNEE ARTHROSCOPY WITH PARTIAL MEDIAL MENISCECTOMY;  Surgeon: Thornton Park, MD;  Location: [facility] ORS;  Service: Orthopedics;  Laterality: Right;        Home Medications    Prior to Admission medications   Medication Sig Start Date End Date Taking? Authorizing Provider  amphetamine-dextroamphetamine (ADDERALL) 20 MG tablet Take 1 tablet (20 mg total) by mouth 2 times daily 01/18/21  Yes   aspirin EC 325 MG tablet Take 1 tablet (325 mg total) by mouth daily. 07/16/19  Yes Thornton Park, MD  EPINEPHrine 0.3 mg/0.3 mL IJ SOAJ injection Inject 0.3 mg into the muscle as needed for anaphylaxis. 07/09/18  Yes [provider]  famotidine (PEPCID) 20 MG tablet TAKE 1 TABLET BY MOUTH TWO TIMES DAILY AS NEEDED FOR HEARTBURN 03/07/20 03/07/21 Yes Kirk Ruths, MD  ondansetron (ZOFRAN) 4 MG tablet Take 1 tablet (4 mg total) by mouth every 8 (eight) hours as needed for nausea or vomiting. 07/16/19  Yes Thornton Park, MD  pantoprazole (PROTONIX) 40 MG tablet TAKE 1 TABLET (40 MG TOTAL) BY MOUTH ONCE DAILY 11/10/20  Yes   tadalafil (CIALIS) 5 MG tablet TAKE 1 TABLET BY MOUTH ONCE DAILY 03/07/20 03/07/21 Yes Kirk Ruths, MD  amphetamine-dextroamphetamine (ADDERALL) 10 MG tablet Take 10 mg by mouth in the morning, at noon, and at bedtime.  10/03/17   [provider]  amphetamine-dextroamphetamine (ADDERALL) 20 MG tablet TAKE 1 TABLET BY MOUTH TWO TIMES DAILY 05/04/20     amphetamine-dextroamphetamine (ADDERALL) 20 MG tablet TAKE 1 TABLET BY MOUTH TWICE DAILY 01/26/20 07/24/20  Kirk Ruths, MD  amphetamine-dextroamphetamine (ADDERALL) 20 MG tablet TAKE 1 TABLET BY MOUTH TWICE DAILY 12/29/19 06/26/20  Kirk Ruths,  MD  amphetamine-dextroamphetamine (ADDERALL) 20 MG tablet TAKE 1 TABLET BY MOUTH TWICE DAILY 11/17/19 05/15/20  Kirk Ruths, MD  amphetamine-dextroamphetamine (ADDERALL) 20 MG tablet TAKE 1 TABLET BY MOUTH TWICE DAILY 10/22/19 04/19/20  Kirk Ruths, MD  cephALEXin (KEFLEX) 500 MG capsule TAKE 1 CAPSULE BY MOUTH 4 TIMES A DAY FOR 10 DAYS. 03/23/20 03/23/21    HYDROcodone-acetaminophen (NORCO)  5-325 MG tablet Take 1 tablet by mouth every 4 (four) hours as needed for moderate pain. 07/16/19   Thornton Park, MD  meloxicam (MOBIC) 15 MG tablet Take 1 tablet (15 mg total) by mouth daily as needed. 06/15/19   Marylene Land, NP  meloxicam (MOBIC) 15 MG tablet Take 1 tablet every day by oral route with meals. 05/20/20     oxyCODONE-acetaminophen (PERCOCET/ROXICET) 5-325 MG tablet Take 1 tablet by mouth every 4 (four) hours as needed for severe pain. 07/21/19   Paulette Blanch, MD  pantoprazole (PROTONIX) 40 MG tablet Take 40 mg by mouth daily.  03/16/19 03/15/20  [provider]  pantoprazole (PROTONIX) 40 MG tablet TAKE 1 TABLET BY MOUTH ONCE DAILY 03/07/20 03/07/21  Kirk Ruths, MD  pantoprazole (PROTONIX) 40 MG tablet TAKE 1 TABLET (40 MG TOTAL) BY MOUTH ONCE DAILY 03/16/19 03/15/20  Kirk Ruths, MD  pantoprazole (PROTONIX) 40 MG tablet TAKE 1 TABLET (40 MG TOTAL) BY MOUTH ONCE DAILY 07/11/20     sulfamethoxazole-trimethoprim (BACTRIM DS) 800-160 MG tablet TAKE 1 TABLET BY MOUTH 2 TIMES A DAY FOR 10 DAYS. 03/23/20 03/23/21    traZODone (DESYREL) 50 MG tablet Take 1-2 tablets (50-100 mg total) by mouth at bedtime 09/09/20       Family History Family History  Problem Relation Age of Onset   Thyroid disease Mother     Social History Social History   Tobacco Use   Smoking status: Light Smoker    Types: Cigarettes   Smokeless tobacco: Never   Tobacco comments:    smokes socially  Vaping Use   Vaping Use: Never used  Substance Use Topics   Alcohol use: Yes    Comment: socially   Drug use: No     Allergies   Alpha-gal   Review of Systems Review of Systems  Constitutional:  Positive for chills. Negative for activity change, appetite change, diaphoresis, fatigue and fever.  HENT: Negative.    Eyes:  Positive for visual disturbance. Negative for photophobia, pain, discharge, redness and itching.  Respiratory:  Positive for shortness of breath. Negative for apnea,  cough, choking, chest tightness, wheezing and stridor.   Cardiovascular: Negative.   Gastrointestinal: Negative.   Musculoskeletal:  Positive for back pain. Negative for arthralgias, gait problem, joint swelling, myalgias, neck pain and neck stiffness.  Skin: Negative.   Neurological: Negative.     Physical Exam Triage Vital Signs ED Triage Vitals  Enc Vitals Group     BP 01/30/21 0911 (!) 141/97     Pulse Rate 01/30/21 0911 90     Resp 01/30/21 0911 18     Temp 01/30/21 0911 98.1 F (36.7 C)     Temp Source 01/30/21 0911 Oral     SpO2 01/30/21 0911 100 %     Weight 01/30/21 0907 230 lb (104.3 kg)     Height 01/30/21 0907 5\' 10"  (1.778 m)     Head Circumference --      Peak Flow --      Pain Score 01/30/21 0905 8     Pain Loc --  Pain Edu? --      Excl. in [place]? --    No data found.  Updated Vital Signs BP (!) 141/97 (BP Location: Left Arm)    Pulse 90    Temp 98.1 F (36.7 C) (Oral)    Resp 18    Ht 5\' 10"  (1.778 m)    Wt 230 lb (104.3 kg)    SpO2 100%    BMI 33.00 kg/m   Visual Acuity Right Eye Distance:   Left Eye Distance:   Bilateral Distance:    Right Eye Near:   Left Eye Near:    Bilateral Near:     Physical Exam Constitutional:      Appearance: Normal appearance.  HENT:     Head: Normocephalic.     Right Ear: Tympanic membrane, ear canal and external ear normal.     Left Ear: Tympanic membrane, ear canal and external ear normal.     Nose: Nose normal.     Mouth/Throat:     Mouth: Mucous membranes are moist.     Pharynx: Oropharynx is clear.  Eyes:     Extraocular Movements: Extraocular movements intact.  Cardiovascular:     Rate and Rhythm: Normal rate and regular rhythm.     Pulses: Normal pulses.     Heart sounds: Normal heart sounds.  Pulmonary:     Effort: Pulmonary effort is normal.     Breath sounds: Normal breath sounds.  Musculoskeletal:     Cervical back: Normal range of motion and neck supple. No rigidity or tenderness.      Comments: Tenderness present along the bilateral latissimus dorsi, no crepitus, deformity, swelling noted, range of motion intact  Lymphadenopathy:     Cervical: No cervical adenopathy.  Skin:    General: Skin is warm and dry.  Neurological:     General: No focal deficit present.     Mental Status: He is alert and oriented to person, place, and time. Mental status is at baseline.  Psychiatric:        Mood and Affect: Mood normal.        Behavior: Behavior normal.     UC Treatments / Results  Labs (all labs ordered are listed, but only abnormal results are displayed) Labs Reviewed - No data to display  EKG   Radiology No results found.  Procedures Procedures (including critical care time)  Medications Ordered in UC Medications - No data to display  Initial Impression / Assessment and Plan / UC Course  I have reviewed the triage vital signs and the nursing notes.  Pertinent labs & imaging results that were available during my care of the patient were reviewed by me and considered in my medical decision making (see chart for details).  Musculoskeletal pain Chest pain Shortness of breath Diaphoresis Decreased appetite  Unknown etiology of symptoms at this time, discussed will patient, will begin rule out basic causes, EKG showing normal sinus rhythm and paced, vital signs are stable, blood glucose 95, chest x-ray negative, respiratory panel negative, CBC negative. CMP, TSH, D-dimer, are pending, prescribed Flexeril and tramadol for management of pain as these are most worrisome symptoms for patient today, given strict precautions for worsening symptoms to go to the nearest emergency department for evaluation, if all testing negative today in urgent care encourage patient to follow-up with primary care for further evaluation management Final Clinical Impressions(s) / UC Diagnoses   Final diagnoses:  None   Discharge Instructions   None  ED Prescriptions   None     PDMP not reviewed this encounter.   Hans Eden, NP 01/30/21 1136    Hans Eden, NP 01/30/21 1137

## 2021-01-30 NOTE — Discharge Instructions (Addendum)
At this time I do not know the cause of your symptoms and have begun to rule out common etiologies   Chest x-ray negative  Complete blood count negative  Point-of-care CBG 95 which is not elevated  EKG showing an regular pace and rhythm  We are awaiting the results for your, respiratory panel, thyroid, D-dimer, electrolytes, kidney and liver any of these labs are of concerning values you will be notified via telephone  If all testing negative today please follow-up with your primary care doctor for further evaluation  Any point if symptoms worsen please go to the nearest emergency department for further evaluation  You may use muscle relaxer twice a day as needed to help with pain, be mindful of this medication may you drowsy  If pain is severe you may use tramadol every 6 hours

## 2021-01-30 NOTE — ED Triage Notes (Signed)
Pt c/o joint pain, SOB, blurry vision, body chills, excessive sweating, loss of appetite, pain along his right lower chest x4days.   Pt declines cough, diarrhea, vomiting, congestion, sore throat and ear pain.   Pt had covid 1 month ago and states that symptoms went away.   Pt was treated for Alpha-gal 2 years ago and states that he now eats with no dietary restrictions.

## 2021-01-31 ENCOUNTER — Other Ambulatory Visit: Payer: Self-pay

## 2021-01-31 MED ORDER — DOXYCYCLINE HYCLATE 100 MG PO TABS
ORAL_TABLET | ORAL | 0 refills | Status: DC
  Filled 2021-01-31: qty 14, 7d supply, fill #0

## 2021-02-02 ENCOUNTER — Encounter: Payer: Self-pay | Admitting: Emergency Medicine

## 2021-02-02 ENCOUNTER — Other Ambulatory Visit: Payer: Self-pay

## 2021-02-02 ENCOUNTER — Inpatient Hospital Stay
Admission: EM | Admit: 2021-02-02 | Discharge: 2021-02-04 | DRG: 392 | Disposition: A | Discharge status: Home / Self Care | Payer: No Typology Code available for payment source | Attending: Hospitalist | Admitting: Hospitalist

## 2021-02-02 ENCOUNTER — Emergency Department: Payer: No Typology Code available for payment source

## 2021-02-02 DIAGNOSIS — K219 Gastro-esophageal reflux disease without esophagitis: Secondary | ICD-10-CM | POA: Diagnosis not present

## 2021-02-02 DIAGNOSIS — Z8616 Personal history of COVID-19: Secondary | ICD-10-CM

## 2021-02-02 DIAGNOSIS — R079 Chest pain, unspecified: Secondary | ICD-10-CM | POA: Diagnosis present

## 2021-02-02 DIAGNOSIS — R0602 Shortness of breath: Secondary | ICD-10-CM | POA: Diagnosis present

## 2021-02-02 DIAGNOSIS — R7303 Prediabetes: Secondary | ICD-10-CM | POA: Diagnosis present

## 2021-02-02 DIAGNOSIS — R6510 Systemic inflammatory response syndrome (SIRS) of non-infectious origin without acute organ dysfunction: Secondary | ICD-10-CM | POA: Diagnosis present

## 2021-02-02 DIAGNOSIS — F17210 Nicotine dependence, cigarettes, uncomplicated: Secondary | ICD-10-CM | POA: Diagnosis present

## 2021-02-02 DIAGNOSIS — Z720 Tobacco use: Secondary | ICD-10-CM | POA: Diagnosis present

## 2021-02-02 DIAGNOSIS — R509 Fever, unspecified: Secondary | ICD-10-CM | POA: Diagnosis not present

## 2021-02-02 DIAGNOSIS — A419 Sepsis, unspecified organism: Principal | ICD-10-CM

## 2021-02-02 DIAGNOSIS — Z7982 Long term (current) use of aspirin: Secondary | ICD-10-CM

## 2021-02-02 DIAGNOSIS — R52 Pain, unspecified: Secondary | ICD-10-CM

## 2021-02-02 DIAGNOSIS — E871 Hypo-osmolality and hyponatremia: Secondary | ICD-10-CM | POA: Diagnosis present

## 2021-02-02 DIAGNOSIS — E876 Hypokalemia: Secondary | ICD-10-CM | POA: Diagnosis present

## 2021-02-02 DIAGNOSIS — Z79899 Other long term (current) drug therapy: Secondary | ICD-10-CM

## 2021-02-02 LAB — CBC WITH DIFFERENTIAL/PLATELET
Abs Immature Granulocytes: 0 10*3/uL (ref 0.00–0.07)
Basophils Absolute: 0 10*3/uL (ref 0.0–0.1)
Basophils Relative: 0 %
Eosinophils Absolute: 0 10*3/uL (ref 0.0–0.5)
Eosinophils Relative: 0 %
HCT: 46.6 % (ref 39.0–52.0)
Hemoglobin: 16.4 g/dL (ref 13.0–17.0)
Lymphocytes Relative: 53 %
Lymphs Abs: 2.7 10*3/uL (ref 0.7–4.0)
MCH: 28.8 pg (ref 26.0–34.0)
MCHC: 35.2 g/dL (ref 30.0–36.0)
MCV: 81.8 fL (ref 80.0–100.0)
Monocytes Absolute: 0.3 10*3/uL (ref 0.1–1.0)
Monocytes Relative: 5 %
Neutro Abs: 2.1 10*3/uL (ref 1.7–7.7)
Neutrophils Relative %: 42 %
Platelets: 167 10*3/uL (ref 150–400)
RBC: 5.7 MIL/uL (ref 4.22–5.81)
RDW: 12.4 % (ref 11.5–15.5)
Smear Review: NORMAL
WBC: 5.1 10*3/uL (ref 4.0–10.5)
nRBC: 0 % (ref 0.0–0.2)

## 2021-02-02 LAB — COMPREHENSIVE METABOLIC PANEL
ALT: 60 U/L — ABNORMAL HIGH (ref 0–44)
AST: 63 U/L — ABNORMAL HIGH (ref 15–41)
Albumin: 4.2 g/dL (ref 3.5–5.0)
Alkaline Phosphatase: 73 U/L (ref 38–126)
Anion gap: 13 (ref 5–15)
BUN: 14 mg/dL (ref 6–20)
CO2: 24 mmol/L (ref 22–32)
Calcium: 9.5 mg/dL (ref 8.9–10.3)
Chloride: 94 mmol/L — ABNORMAL LOW (ref 98–111)
Creatinine, Ser: 1.12 mg/dL (ref 0.61–1.24)
GFR, Estimated: >60 mL/min (ref >60)
Glucose, Bld: 101 mg/dL — ABNORMAL HIGH (ref 70–99)
Potassium: 3.3 mmol/L — ABNORMAL LOW (ref 3.5–5.1)
Sodium: 131 mmol/L — ABNORMAL LOW (ref 135–145)
Total Bilirubin: 0.6 mg/dL (ref 0.3–1.2)
Total Protein: 8.2 g/dL — ABNORMAL HIGH (ref 6.5–8.1)

## 2021-02-02 LAB — RESP PANEL BY RT-PCR (FLU A&B, COVID) ARPGX2
Influenza A by PCR: NEGATIVE
Influenza B by PCR: NEGATIVE
SARS Coronavirus 2 by RT PCR: NEGATIVE

## 2021-02-02 LAB — LACTIC ACID, PLASMA
Lactic Acid, Venous: 2 mmol/L (ref 0.5–1.9)
Lactic Acid, Venous: 1.3 mmol/L (ref 0.5–1.9)

## 2021-02-02 LAB — MAGNESIUM: Magnesium: 1.9 mg/dL (ref 1.7–2.4)

## 2021-02-02 LAB — TROPONIN I (HIGH SENSITIVITY): Troponin I (High Sensitivity): 6 ng/L (ref <18)

## 2021-02-02 LAB — APTT: aPTT: 38 seconds — ABNORMAL HIGH (ref 24–36)

## 2021-02-02 LAB — PROCALCITONIN: Procalcitonin: 0.11 ng/mL

## 2021-02-02 LAB — PROTIME-INR
INR: 1 (ref 0.8–1.2)
Prothrombin Time: 13.5 seconds (ref 11.4–15.2)

## 2021-02-02 LAB — PROTEIN AND GLUCOSE, CSF
Glucose, CSF: 50 mg/dL (ref 40–70)
Total  Protein, CSF: 51 mg/dL — ABNORMAL HIGH (ref 15–45)

## 2021-02-02 LAB — MONONUCLEOSIS SCREEN: Mono Screen: NEGATIVE

## 2021-02-02 MED ORDER — SODIUM CHLORIDE 0.9 % IV BOLUS (SEPSIS)
1000.0000 mL | Freq: Once | INTRAVENOUS | Status: AC
  Administered 2021-02-02: 1000 mL via INTRAVENOUS

## 2021-02-02 MED ORDER — SODIUM CHLORIDE 0.9 % IV SOLN
2.0000 g | Freq: Once | INTRAVENOUS | Status: AC
  Administered 2021-02-02: 2 g via INTRAVENOUS

## 2021-02-02 MED ORDER — VANCOMYCIN HCL 2000 MG/400ML IV SOLN
2000.0000 mg | Freq: Once | INTRAVENOUS | Status: AC
  Administered 2021-02-02: 2000 mg via INTRAVENOUS
  Filled 2021-02-02 (×2): qty 400

## 2021-02-02 MED ORDER — ACETAMINOPHEN 500 MG PO TABS
1000.0000 mg | ORAL_TABLET | Freq: Once | ORAL | Status: AC
  Administered 2021-02-02: 1000 mg via ORAL

## 2021-02-02 MED ORDER — PANTOPRAZOLE SODIUM 40 MG IV SOLR
40.0000 mg | Freq: Two times a day (BID) | INTRAVENOUS | Status: DC
  Administered 2021-02-03 (×3): 40 mg via INTRAVENOUS
  Filled 2021-02-02 (×3): qty 40

## 2021-02-02 MED ORDER — METRONIDAZOLE 500 MG/100ML IV SOLN
500.0000 mg | Freq: Once | INTRAVENOUS | Status: AC
  Administered 2021-02-02: 500 mg via INTRAVENOUS

## 2021-02-02 MED ORDER — SODIUM CHLORIDE 0.9 % IV SOLN
2.0000 g | Freq: Three times a day (TID) | INTRAVENOUS | Status: DC
  Administered 2021-02-03 (×2): 2 g via INTRAVENOUS
  Filled 2021-02-02 (×3): qty 2

## 2021-02-02 MED ORDER — LACTATED RINGERS IV SOLN: INTRAVENOUS | Status: DC

## 2021-02-02 MED ORDER — METRONIDAZOLE 500 MG/100ML IV SOLN
500.0000 mg | Freq: Three times a day (TID) | INTRAVENOUS | Status: DC
  Administered 2021-02-03: 500 mg via INTRAVENOUS
  Filled 2021-02-02 (×3): qty 100

## 2021-02-02 MED ORDER — LORAZEPAM 2 MG/ML IJ SOLN
1.0000 mg | Freq: Once | INTRAMUSCULAR | Status: AC
  Administered 2021-02-02: 1 mg via INTRAVENOUS
  Filled 2021-02-02: qty 1

## 2021-02-02 MED ORDER — VANCOMYCIN HCL IN DEXTROSE 1-5 GM/200ML-% IV SOLN: 1000.0000 mg | Freq: Once | INTRAVENOUS | Status: DC

## 2021-02-02 NOTE — Progress Notes (Signed)
PHARMACY -  BRIEF ANTIBIOTIC NOTE   Pharmacy has received consult(s) for vancomycin and cefepime from an ED provider.  The patient's profile has been reviewed for ht/wt/allergies/indication/available labs.    One time order(s) placed for   1) 2 grams IV cefepime  2) 2000 mg IV vancomycin  Further antibiotics/pharmacy consults should be ordered by admitting physician if indicated.                       Thank you, Dallie Piles 02/02/2021  6:13 PM

## 2021-02-02 NOTE — ED Triage Notes (Signed)
Pt in via POV, has been having ongoing shortness of breath, with elevated d-dimer, previous CTA unremarkable for PE per wife.  Patient has since seen PCP, presrcribed Doxycycline for possible Tick Fever, has been on that for 48 hours without any relief.  States symptoms are worsening, now complaining of excruciating neck pain with facial numbness.  Since advised to be evaluated here for potential spinal tap.  Pt A/Ox4, appears uncomfortable in triage.

## 2021-02-02 NOTE — Sepsis Progress Note (Signed)
eLink monitoring code sepsis.  

## 2021-02-02 NOTE — H&P (Signed)
History and Physical    [name] TKZ:[id number] DOB: [birth year] DOA: 02/02/2021  PCP: Kirk Ruths, MD    Patient coming from:  Home   Chief Complaint:  Shortness of breath   HPI:  Darren Miller is a 36 y.o. male seen in ed with complaints of shortness of breath, generalized weakness, fever, nausea, headaches, neck pain all since Saturday. Chest pain body aches and elevated dimer at [facility] [place] Initial d-dimer was 1780 and second at [facility]- 6525. Pt had high bp and headache. Fever started 6 days. Chest pain sun/mon- Located in rt lower chest . 7/10, no nausea or vomiting. NR, no aggravating or alleviating facto's.   Patient went to urgent care at [facility] where with RN to have an elevated D-dimer and a CTA chest was done on 01/31/2020 and found to be negative for pulmonary embolism. Patient was also given doxycycline for presumed tickborne illnesses x 3 days. Has h/o tick fever in past.   Patient had COVID about a month ago. Patient had an LP in the emergency room which showed an initial bloody tap and cleared by of for the tube spinal fluid. Pt was diagnosed with OSA about a month ago.   Chart review shows : EGD 2019- Normal examined duodenum. - Normal stomach. - LA Grade B reflux esophagitis. - The examination was otherwise normal. - No specimens collected.  Colonoscopy 2019: - Non-bleeding internal hemorrhoids. - The examination was otherwise normal on direct and retroflexion views. - No specimens collected.  Etoh on weekends. No drugs.   Pt has past medical history of self-neglect ascites deficiency, ADD, prediabetes, COVID.  ED Course:   Vitals:   02/02/21 2230 02/02/21 2300 02/02/21 2330 02/03/21 0008  BP: 116/72 104/62 (!) 106/56   Pulse: 78 76 73   Resp: 17  18   Temp:    97.9 F (36.6 C)  TempSrc:    Oral  SpO2: 92% 93% 94%   In the emergency room patient was found to be febrile and underwent lumbar puncture.  Lactic acid of 2.0  AST of 63 ALT of 6.0 total protein of 8.2 hyponatremia, hypokalemia.  Repeat lactic acid 1.2, CBC within normal limits. In the emergency room patient received vancomycin and cefepime and metronidazole.  Review of Systems:  Review of Systems  Constitutional:  Positive for chills, fever and malaise/fatigue.  Respiratory:  Positive for shortness of breath.   Cardiovascular:  Positive for chest pain.  Neurological:  Positive for weakness.   Past Medical History:  Diagnosis Date   Allergy to alpha-gal    GERD (gastroesophageal reflux disease)     Past Surgical History:  Procedure Laterality Date   APPENDECTOMY     COLONOSCOPY WITH PROPOFOL N/A 11/19/2017   Procedure: COLONOSCOPY WITH PROPOFOL;  Surgeon: Jonathon Bellows, MD;  Location: [facility] ENDOSCOPY;  Service: Gastroenterology;  Laterality: N/A;   ESOPHAGOGASTRODUODENOSCOPY (EGD) WITH PROPOFOL N/A 11/19/2017   Procedure: ESOPHAGOGASTRODUODENOSCOPY (EGD) WITH PROPOFOL;  Surgeon: Jonathon Bellows, MD;  Location: [facility] ENDOSCOPY;  Service: Gastroenterology;  Laterality: N/A;   KNEE ARTHROSCOPY WITH MENISCAL REPAIR Right 07/16/2019   Procedure: RIGHT KNEE ARTHROSCOPY WITH PARTIAL MEDIAL MENISCECTOMY;  Surgeon: Thornton Park, MD;  Location: [facility] ORS;  Service: Orthopedics;  Laterality: Right;     reports that he has been smoking cigarettes. He has never used smokeless tobacco. He reports current alcohol use. He reports that he does not use drugs.  Allergies  Allergen Reactions   Alpha-Gal     Meat  allergy, pt says all symptons subsided after acupuncture     Family History  Problem Relation Age of Onset   Thyroid disease Mother     Prior to Admission medications   Medication Sig Start Date End Date Taking? Authorizing Provider  amphetamine-dextroamphetamine (ADDERALL) 10 MG tablet Take 10 mg by mouth in the morning, at noon, and at bedtime.  10/03/17   [provider]  amphetamine-dextroamphetamine (ADDERALL) 20 MG tablet TAKE 1 TABLET  BY MOUTH TWO TIMES DAILY 05/04/20     amphetamine-dextroamphetamine (ADDERALL) 20 MG tablet TAKE 1 TABLET BY MOUTH TWICE DAILY 01/26/20 07/24/20  Kirk Ruths, MD  amphetamine-dextroamphetamine (ADDERALL) 20 MG tablet TAKE 1 TABLET BY MOUTH TWICE DAILY 12/29/19 06/26/20  Kirk Ruths, MD  amphetamine-dextroamphetamine (ADDERALL) 20 MG tablet TAKE 1 TABLET BY MOUTH TWICE DAILY 11/17/19 05/15/20  Kirk Ruths, MD  amphetamine-dextroamphetamine (ADDERALL) 20 MG tablet TAKE 1 TABLET BY MOUTH TWICE DAILY 10/22/19 04/19/20  Kirk Ruths, MD  amphetamine-dextroamphetamine (ADDERALL) 20 MG tablet Take 1 tablet (20 mg total) by mouth 2 times daily 01/18/21     aspirin EC 325 MG tablet Take 1 tablet (325 mg total) by mouth daily. 07/16/19   Thornton Park, MD  cephALEXin (KEFLEX) 500 MG capsule TAKE 1 CAPSULE BY MOUTH 4 TIMES A DAY FOR 10 DAYS. 03/23/20 03/23/21    cyclobenzaprine (FLEXERIL) 10 MG tablet Take 1 tablet (10 mg total) by mouth 2 (two) times daily as needed for muscle spasms. 01/30/21   Hans Eden, NP  doxycycline (VIBRA-TABS) 100 MG tablet Take 1 tablet by mouth two times daily for 7 days 01/31/21     EPINEPHrine 0.3 mg/0.3 mL IJ SOAJ injection Inject 0.3 mg into the muscle as needed for anaphylaxis. 07/09/18   [provider]  famotidine (PEPCID) 20 MG tablet TAKE 1 TABLET BY MOUTH TWO TIMES DAILY AS NEEDED FOR HEARTBURN 03/07/20 03/07/21  Kirk Ruths, MD  HYDROcodone-acetaminophen (NORCO) 5-325 MG tablet Take 1 tablet by mouth every 4 (four) hours as needed for moderate pain. 07/16/19   Thornton Park, MD  meloxicam (MOBIC) 15 MG tablet Take 1 tablet (15 mg total) by mouth daily as needed. 06/15/19   Marylene Land, NP  meloxicam (MOBIC) 15 MG tablet Take 1 tablet every day by oral route with meals. 05/20/20     ondansetron (ZOFRAN) 4 MG tablet Take 1 tablet (4 mg total) by mouth every 8 (eight) hours as needed for nausea or vomiting. 07/16/19   Thornton Park,  MD  oxyCODONE-acetaminophen (PERCOCET/ROXICET) 5-325 MG tablet Take 1 tablet by mouth every 4 (four) hours as needed for severe pain. 07/21/19   Paulette Blanch, MD  pantoprazole (PROTONIX) 40 MG tablet Take 40 mg by mouth daily.  03/16/19 03/15/20  [provider]  pantoprazole (PROTONIX) 40 MG tablet TAKE 1 TABLET BY MOUTH ONCE DAILY 03/07/20 03/07/21  Kirk Ruths, MD  pantoprazole (PROTONIX) 40 MG tablet TAKE 1 TABLET (40 MG TOTAL) BY MOUTH ONCE DAILY 03/16/19 03/15/20  Kirk Ruths, MD  pantoprazole (PROTONIX) 40 MG tablet TAKE 1 TABLET (40 MG TOTAL) BY MOUTH ONCE DAILY 07/11/20     pantoprazole (PROTONIX) 40 MG tablet TAKE 1 TABLET (40 MG TOTAL) BY MOUTH ONCE DAILY 11/10/20     sulfamethoxazole-trimethoprim (BACTRIM DS) 800-160 MG tablet TAKE 1 TABLET BY MOUTH 2 TIMES A DAY FOR 10 DAYS. 03/23/20 03/23/21    tadalafil (CIALIS) 5 MG tablet TAKE 1 TABLET BY MOUTH ONCE DAILY 03/07/20 03/07/21  Kirk Ruths, MD  traMADol (ULTRAM) 50 MG tablet Take 1 tablet (50 mg total) by mouth every 6 (six) hours as needed. 01/30/21   Hans Eden, NP  traZODone (DESYREL) 50 MG tablet Take 1-2 tablets (50-100 mg total) by mouth at bedtime 09/09/20       Physical Exam: Vitals:   02/02/21 2230 02/02/21 2300 02/02/21 2330 02/03/21 0008  BP: 116/72 104/62 (!) 106/56   Pulse: 78 76 73   Resp: 17  18   Temp:    97.9 F (36.6 C)  TempSrc:    Oral  SpO2: 92% 93% 94%    Physical Exam Vitals reviewed.  Constitutional:      General: He is not in acute distress.    Appearance: He is not ill-appearing, toxic-appearing or diaphoretic.  HENT:     Head: Normocephalic and atraumatic.     Right Ear: External ear normal.     Left Ear: External ear normal.     Nose: Nose normal.  Eyes:     Extraocular Movements: Extraocular movements intact.     Pupils: Pupils are equal, round, and reactive to light.  Neck:     Vascular: No carotid bruit.  Cardiovascular:     Rate and Rhythm: Normal rate and  regular rhythm.     Pulses: Normal pulses.     Heart sounds: Normal heart sounds.  Pulmonary:     Effort: Pulmonary effort is normal.     Breath sounds: Normal breath sounds.  Abdominal:     General: Bowel sounds are normal. There is no distension.     Palpations: Abdomen is soft. There is no mass.     Tenderness: There is no abdominal tenderness. There is no guarding.     Hernia: No hernia is present.       Comments: Chest pain 8/10.   Musculoskeletal:     Right lower leg: No edema.     Left lower leg: No edema.  Neurological:     General: No focal deficit present.     Mental Status: He is alert and oriented to person, place, and time.     Cranial Nerves: No cranial nerve deficit.     Motor: No weakness.  Psychiatric:        Mood and Affect: Mood normal.        Behavior: Behavior normal.     Labs on Admission: I have personally reviewed following labs and imaging studies No results for input(s): CKTOTAL, CKMB, TROPONINI in the last 72 hours. Lab Results  Component Value Date   WBC 5.1 02/02/2021   HGB 16.4 02/02/2021   HCT 46.6 02/02/2021   MCV 81.8 02/02/2021   PLT 167 02/02/2021    Recent Labs  Lab 02/02/21 1827  NA 131*  K 3.3*  CL 94*  CO2 24  BUN 14  CREATININE 1.12  CALCIUM 9.5  PROT 8.2*  BILITOT 0.6  ALKPHOS 73  ALT 60*  AST 63*  GLUCOSE 101*   No results found for: CHOL, HDL, LDLCALC, TRIG Lab Results  Component Value Date   DDIMER 1.78 (H) 01/30/2021   Invalid input(s): POCBNP   COVID-19 Labs No results for input(s): DDIMER, FERRITIN, LDH, CRP in the last 72 hours. Lab Results  Component Value Date   SARSCOV2NAA NEGATIVE 02/02/2021   [place] NEGATIVE 01/30/2021   [place] NEGATIVE 07/14/2019   [place] Not Detected 08/11/2018    Radiological Exams on Admission: DG Chest Port 1 View  Result Date:  02/02/2021 CLINICAL DATA:  Sepsis EXAM: PORTABLE CHEST 1 VIEW COMPARISON:  01/30/2021 FINDINGS: The cardiac silhouette,  mediastinal and hilar contours are normal. Low lung volumes with vascular crowding and streaky basilar atelectasis but no infiltrates, edema or effusions. The bony thorax is intact. IMPRESSION: Low lung volumes with vascular crowding and streaky basilar atelectasis. Electronically Signed   By: Marijo Sanes M.D.   On: 02/02/2021 18:59    EKG: Independently reviewed.  S. Tach 105 otherwise normal.   Assessment/Plan: Principal Problem:   SOB (shortness of breath) Active Problems:   Chest pain   Sepsis ([place])   Prediabetes   Tobacco abuse   Patient is a 36 year old Caucasian male presenting with multiple varied symptoms in the past week or so getting worse with fever shortness of breath chest pain. Shortness of breath: Suspect secondary to GERD related issues.  Patient has CT angio which was negative for blood clots or infiltrates. Supportive care and as needed MDI as needed.  Chest pain: Patient complaining of chest pain and points to right flank and right upper quadrant area.  I suspect this may be gallbladder related etiology. We will obtain ruq usg and ggt.  Stop NSAID's./    Prediabetes: We will follow a1c.    H/O UTI: We will folllow culture.s  Sepsis: Pt meets sepsis criteria. We will continue pharmacy consult and follow cultures and stop abx once cultures have resulted . Supportive care with ivf and monitor.   Tobacco abuse:  Nicotine patch.   DVT prophylaxis:  heparin   Code Status:  Full code    Family Communication:  Nielson,Brittney (Spouse)  [phone] (Home Phone)   Disposition Plan:  Home    Consults called:  None   Admission status: Inpatient.    Medical Decision Making   Coding    Para Skeans MD [facility]  6 PM- 2 AM. Please contact me via secure Chat 6 PM-2 AM. To contact the [facility] Attending or Consulting provider [place] or covering provider during after hours [place], for this patient.   Check the care team in [facility] and  look for a) attending/consulting [facility] provider listed and b) the [facility] team listed Log into [email or web address] and use [place]'s universal password to access. If you do not have the password, please contact the hospital operator. Locate the [facility] provider you are looking for under [facility] and page to a number that you can be directly reached. If you still have difficulty reaching the provider, please page the [facility] (Director on Call) for the Hospitalists listed on amion for assistance. [email or web address] 02/03/2021, 12:55 AM

## 2021-02-02 NOTE — ED Provider Notes (Signed)
[facility] Provider Note    Event Date/Time   First MD Initiated Contact with Patient 02/02/21 1956     (approximate)  History   Chief Complaint: Shortness of Breath  HPI  Darren Miller is a 36 y.o. male with no significant past medical history presents to the emergency department for fever weakness.  According to the patient and wife over the past 6 days the patient has been experiencing fever shortness of breath generalized fatigue and nausea.  States symptoms have been worsening now the patient has significant headache as well as neck pain and intermittent facial tingling.  Patient has been seen at urgent care, [facility] emergency department and his primary care doctor.  Patient states he has had several blood work test done had an elevated D-dimer had a CTA of the chest that was negative at [place].  PCP prescribed the patient doxycycline for possible tickborne illnesses.  Patient has been taking doxycycline for 2 days with no relief.  Patient developed headache and neck pain so they called the doctor who referred him to the emergency department for evaluation and possible consideration of lumbar puncture.  Physical Exam   Triage Vital Signs: ED Triage Vitals  Enc Vitals Group     BP 02/02/21 1744 124/87     Pulse Rate 02/02/21 1744 (!) 102     Resp 02/02/21 1744 (!) 22     Temp 02/02/21 1744 (!) 101.2 F (38.4 C)     Temp Source 02/02/21 1744 Oral     SpO2 02/02/21 1744 100 %     Weight --      Height --      Head Circumference --      Peak Flow --      Pain Score 02/02/21 1759 9     Pain Loc --      Pain Edu? --      Excl. in [place]? --     Most recent vital signs: Vitals:   02/02/21 2100 02/02/21 2130  BP: (!) 109/58 113/73  Pulse: 77 78  Resp: 19 16  Temp:    SpO2: 93% 91%    General: Awake, no distress.  CV:  Good peripheral perfusion.  Regular rate and rhythm around 100 bpm Resp:  Normal effort.  Equal breath sounds bilaterally.  Abd:  No  distention.  Soft, nontender.  No rebound or guarding. Other:  Small rash small areas of red patches to his upper extremities and back which he states have been occurring with the fever.   ED Results / Procedures / Treatments   EKG  EKG viewed and interpreted by myself shows sinus tachycardia 105 bpm with a narrow QRS, normal axis, normal intervals, no concerning ST changes.  RADIOLOGY  I have personally chest x-ray images, no acute findings on my evaluation. Radiology has read the chest x-ray as atelectasis in lower lobes.  MEDICATIONS ORDERED IN ED: Medications  lactated ringers infusion (has no administration in time range)  vancomycin (VANCOREADY) IVPB 2000 mg/400 mL (2,000 mg Intravenous New Bag/Given 02/02/21 2034)  sodium chloride 0.9 % bolus 1,000 mL (1,000 mLs Intravenous New Bag/Given 02/02/21 1900)  ceFEPIme (MAXIPIME) 2 g in sodium chloride 0.9 % 100 mL IVPB (0 g Intravenous Stopped 02/02/21 2002)  metroNIDAZOLE (FLAGYL) IVPB 500 mg (0 mg Intravenous Stopped 02/02/21 2034)  acetaminophen (TYLENOL) tablet 1,000 mg (1,000 mg Oral Given 02/02/21 1934)  LORazepam (ATIVAN) injection 1 mg (1 mg Intravenous Given 02/02/21 2016)  IMPRESSION / MDM / ASSESSMENT AND PLAN / ED COURSE  I reviewed the triage vital signs and the nursing notes.  Patient presents to the emergency department for continued fever now with headache neck pain.  I reviewed the patient's ER visit to [facility] 01/30/2021, at that time patient had elevated D-dimer to 6000, CTA negative for PE or other acute finding.  Negative troponin and otherwise reassuring lab work.  Patient was ultimately discharged home.  Patient currently taking doxycycline for the past 48 hours as prescribed by his PCP 2 days ago although denies any known tick bites.  Patient does state [place]  Here patient is satting 91% on room air, he is febrile, initially tachycardic as well meeting sepsis criteria.  We will start the  patient on broad-spectrum IV antibiotics send labs, cultures and continue to closely monitor.  Chest x-ray shows atelectasis but no definitive abnormality.  Ischial lactate of 2.0 although decreasing with IV fluids.  Chemistry overall reassuring slight hyponatremia at 131.  Reassuringly normal white blood cell count.  Patient COVID/influenza is negative.  After discussion with the patient he is agreeable to proceed with lumbar puncture in the emergency department for diagnostic purposes.  Patient consented for the procedure.  I spoke to the hospitalist regarding the patient's symptoms they are agreeable to admit for further work-up and treatment.  Patient receiving IV antibiotics.  CSF results are pending.  LUMBAR PUNCTURE  Date/Time: 02/02/2021 at 9:42 PM Performed by: Harvest Dark  Consent: Verbal consent obtained. Written consent obtained. Risks and benefits: risks, benefits and alternatives were discussed Consent given by: Patient Patient understanding: patient states understanding of the procedure being performed  Patient consent: the patient's understanding of the procedure matches consent given  Procedure consent: procedure consent matches procedure scheduled  Relevant documents: relevant documents present and verified  Test results: test results available and properly labeled Site marked: the operative site was marked Imaging studies: imaging studies available  Required items: required blood products, implants, devices, and special equipment available  Patient identity confirmed: verbally with patient and arm band  Time out: Immediately prior to procedure a "time out" was called to verify the correct patient, procedure, equipment, support staff and site/side marked as required.  Indications: Rule out meningitis Anesthesia: local infiltration Local anesthetic: lidocaine 1% without epinephrine Anesthetic total: 5 ml Patient sedated: 1 mg of Ativan Analgesia: None Preparation:  Patient was prepped and draped in the usual sterile fashion. Lumbar space: L3-L4 interspace Patient's position: left lateral decubitus Needle gauge: 22 Needle length: 3.5 in Number of attempts: 1 Fluid appearance: Slightly turbid in tube 1.  Clear in tubes 2 through 4 Tubes of fluid: 4 Total volume: 6 ml Post-procedure: site cleaned and adhesive bandage applied Patient tolerance: Patient tolerated the procedure well with no immediate complications   CRITICAL CARE Performed by: Harvest Dark   Total critical care time: 30 minutes  Critical care time was exclusive of separately billable procedures and treating other patients.  Critical care was necessary to treat or prevent imminent or life-threatening deterioration.  Critical care was time spent personally by me on the following activities: development of treatment plan with patient and/or surrogate as well as nursing, discussions with consultants, evaluation of patient's response to treatment, examination of patient, obtaining history from patient or surrogate, ordering and performing treatments and interventions, ordering and review of laboratory studies, ordering and review of radiographic studies, pulse oximetry and re-evaluation of patient's condition.  FINAL CLINICAL IMPRESSION(S) / ED DIAGNOSES   Sepsis Fever of unknown origin    Note:  This document was prepared using Dragon voice recognition software and may include unintentional dictation errors.   Harvest Dark, MD 02/03/21 [place]

## 2021-02-03 ENCOUNTER — Encounter: Payer: Self-pay | Admitting: Internal Medicine

## 2021-02-03 ENCOUNTER — Inpatient Hospital Stay: Admit: 2021-02-03 | Payer: No Typology Code available for payment source

## 2021-02-03 ENCOUNTER — Inpatient Hospital Stay: Payer: No Typology Code available for payment source

## 2021-02-03 DIAGNOSIS — F17210 Nicotine dependence, cigarettes, uncomplicated: Secondary | ICD-10-CM | POA: Diagnosis present

## 2021-02-03 DIAGNOSIS — K219 Gastro-esophageal reflux disease without esophagitis: Secondary | ICD-10-CM | POA: Diagnosis present

## 2021-02-03 DIAGNOSIS — Z7982 Long term (current) use of aspirin: Secondary | ICD-10-CM | POA: Diagnosis not present

## 2021-02-03 DIAGNOSIS — Z720 Tobacco use: Secondary | ICD-10-CM | POA: Diagnosis present

## 2021-02-03 DIAGNOSIS — Z79899 Other long term (current) drug therapy: Secondary | ICD-10-CM | POA: Diagnosis not present

## 2021-02-03 DIAGNOSIS — R7303 Prediabetes: Secondary | ICD-10-CM | POA: Diagnosis present

## 2021-02-03 DIAGNOSIS — R509 Fever, unspecified: Secondary | ICD-10-CM | POA: Diagnosis present

## 2021-02-03 DIAGNOSIS — E876 Hypokalemia: Secondary | ICD-10-CM | POA: Diagnosis present

## 2021-02-03 DIAGNOSIS — R0602 Shortness of breath: Secondary | ICD-10-CM | POA: Diagnosis not present

## 2021-02-03 DIAGNOSIS — Z8616 Personal history of COVID-19: Secondary | ICD-10-CM | POA: Diagnosis not present

## 2021-02-03 DIAGNOSIS — R6510 Systemic inflammatory response syndrome (SIRS) of non-infectious origin without acute organ dysfunction: Secondary | ICD-10-CM | POA: Diagnosis present

## 2021-02-03 DIAGNOSIS — E871 Hypo-osmolality and hyponatremia: Secondary | ICD-10-CM | POA: Diagnosis present

## 2021-02-03 DIAGNOSIS — R079 Chest pain, unspecified: Secondary | ICD-10-CM | POA: Diagnosis present

## 2021-02-03 DIAGNOSIS — A419 Sepsis, unspecified organism: Secondary | ICD-10-CM | POA: Diagnosis present

## 2021-02-03 LAB — URINALYSIS, COMPLETE (UACMP) WITH MICROSCOPIC
Bacteria, UA: NONE SEEN
Bilirubin Urine: NEGATIVE
Glucose, UA: NEGATIVE mg/dL
Hgb urine dipstick: NEGATIVE
Ketones, ur: 20 mg/dL — AB
Leukocytes,Ua: NEGATIVE
Nitrite: NEGATIVE
Protein, ur: NEGATIVE mg/dL
Specific Gravity, Urine: 1.006 (ref 1.005–1.030)
Squamous Epithelial / HPF: NONE SEEN (ref 0–5)
WBC, UA: NONE SEEN WBC/hpf (ref 0–5)
pH: 6 (ref 5.0–8.0)

## 2021-02-03 LAB — CSF CELL COUNT WITH DIFFERENTIAL
Eosinophils, CSF: 0 %
Eosinophils, CSF: 0 %
Lymphs, CSF: 30 %
Lymphs, CSF: 83 %
Monocyte-Macrophage-Spinal Fluid: 17 %
Monocyte-Macrophage-Spinal Fluid: 57 %
RBC Count, CSF: 141 /mm3 — ABNORMAL HIGH (ref 0–3)
RBC Count, CSF: 1471 /mm3 — ABNORMAL HIGH (ref 0–3)
Segmented Neutrophils-CSF: 0 %
Segmented Neutrophils-CSF: 13 %
Tube #: 1
Tube #: 4
WBC, CSF: 5 /mm3 (ref 0–5)
WBC, CSF: 5 /mm3 (ref 0–5)

## 2021-02-03 LAB — CBC
HCT: 38.1 % — ABNORMAL LOW (ref 39.0–52.0)
Hemoglobin: 13.5 g/dL (ref 13.0–17.0)
MCH: 28.7 pg (ref 26.0–34.0)
MCHC: 35.4 g/dL (ref 30.0–36.0)
MCV: 80.9 fL (ref 80.0–100.0)
Platelets: 154 10*3/uL (ref 150–400)
RBC: 4.71 MIL/uL (ref 4.22–5.81)
RDW: 12.4 % (ref 11.5–15.5)
WBC: 4.6 10*3/uL (ref 4.0–10.5)
nRBC: 0 % (ref 0.0–0.2)

## 2021-02-03 LAB — BASIC METABOLIC PANEL
Anion gap: 6 (ref 5–15)
BUN: 14 mg/dL (ref 6–20)
CO2: 25 mmol/L (ref 22–32)
Calcium: 8.3 mg/dL — ABNORMAL LOW (ref 8.9–10.3)
Chloride: 103 mmol/L (ref 98–111)
Creatinine, Ser: 1.06 mg/dL (ref 0.61–1.24)
GFR, Estimated: >60 mL/min (ref >60)
Glucose, Bld: 91 mg/dL (ref 70–99)
Potassium: 3.2 mmol/L — ABNORMAL LOW (ref 3.5–5.1)
Sodium: 134 mmol/L — ABNORMAL LOW (ref 135–145)

## 2021-02-03 LAB — PROTIME-INR
INR: 1 (ref 0.8–1.2)
Prothrombin Time: 13.5 seconds (ref 11.4–15.2)

## 2021-02-03 LAB — HIV ANTIBODY (ROUTINE TESTING W REFLEX): HIV Screen 4th Generation wRfx: NONREACTIVE

## 2021-02-03 LAB — TROPONIN I (HIGH SENSITIVITY): Troponin I (High Sensitivity): 3 ng/L (ref <18)

## 2021-02-03 LAB — GAMMA GT: GGT: 39 U/L (ref 7–50)

## 2021-02-03 LAB — PROCALCITONIN: Procalcitonin: <0.1 ng/mL

## 2021-02-03 LAB — CK: Total CK: 121 U/L (ref 49–397)

## 2021-02-03 LAB — MAGNESIUM: Magnesium: 1.9 mg/dL (ref 1.7–2.4)

## 2021-02-03 LAB — RPR: RPR Ser Ql: NONREACTIVE

## 2021-02-03 MED ORDER — VANCOMYCIN HCL 750 MG/150ML IV SOLN: 750.0000 mg | INTRAVENOUS | Status: DC

## 2021-02-03 MED ORDER — ENOXAPARIN SODIUM 60 MG/0.6ML IJ SOSY
0.5000 mg/kg | PREFILLED_SYRINGE | INTRAMUSCULAR | Status: DC
  Administered 2021-02-04: 50 mg via SUBCUTANEOUS
  Filled 2021-02-03: qty 0.6

## 2021-02-03 MED ORDER — MELATONIN 5 MG PO TABS
5.0000 mg | ORAL_TABLET | Freq: Every day | ORAL | Status: DC
  Filled 2021-02-03: qty 1

## 2021-02-03 MED ORDER — BUTALBITAL-APAP-CAFFEINE 50-325-40 MG PO TABS
2.0000 | ORAL_TABLET | Freq: Two times a day (BID) | ORAL | Status: DC | PRN
  Administered 2021-02-03 – 2021-02-04 (×3): 2 via ORAL
  Filled 2021-02-03 (×3): qty 2

## 2021-02-03 MED ORDER — ACETAMINOPHEN 500 MG PO TABS
1000.0000 mg | ORAL_TABLET | Freq: Four times a day (QID) | ORAL | Status: DC | PRN
  Filled 2021-02-03 (×2): qty 2

## 2021-02-03 MED ORDER — MORPHINE SULFATE (PF) 2 MG/ML IV SOLN
1.0000 mg | INTRAVENOUS | Status: DC | PRN
  Administered 2021-02-03: 1 mg via INTRAVENOUS
  Filled 2021-02-03: qty 1

## 2021-02-03 MED ORDER — BUTALBITAL-APAP-CAFFEINE 50-325-40 MG PO TABS
2.0000 | ORAL_TABLET | Freq: Once | ORAL | Status: AC
  Administered 2021-02-03: 2 via ORAL
  Filled 2021-02-03: qty 2

## 2021-02-03 MED ORDER — TRAZODONE HCL 100 MG PO TABS
100.0000 mg | ORAL_TABLET | Freq: Every evening | ORAL | Status: DC | PRN
  Administered 2021-02-03: 100 mg via ORAL
  Filled 2021-02-03: qty 1

## 2021-02-03 MED ORDER — VANCOMYCIN HCL 2000 MG/400ML IV SOLN: 2000.0000 mg | INTRAVENOUS | Status: DC

## 2021-02-03 MED ORDER — HEPARIN SODIUM (PORCINE) 5000 UNIT/ML IJ SOLN
5000.0000 [IU] | Freq: Three times a day (TID) | INTRAMUSCULAR | Status: DC
  Administered 2021-02-03 (×4): 5000 [IU] via SUBCUTANEOUS
  Filled 2021-02-03 (×4): qty 1

## 2021-02-03 NOTE — Progress Notes (Signed)
°   02/03/21 1045  Clinical Encounter Type  Visited With Patient and family together  Visit Type Initial;Social support   Patient expressed anxiousness as it seems the cause of illness is unknown. Patient expressed restlessness, eager to be up and about. Chaplain engaged in meaningful conversation, providing support through compassionate presence and prayer.

## 2021-02-03 NOTE — TOC Initial Note (Signed)
Transition of Care [facility]) - Progression Note    Patient Details  Name: Darren Miller MRN: [id number] Date of Birth: 10-28-1985  Transition of Care [facility]) CM/SW [place], RN Phone Number: 02/03/2021, 9:29 AM  Clinical Narrative:      Transition of Care [facility]) Screening Note   Patient Details  Name: Darren Miller Date of Birth: [birth year]   Transition of Care [facility]) CM/SW Contact:    Conception Oms, RN Phone Number: 02/03/2021, 9:29 AM    Transition of Care Department [facility]) has reviewed patient and no TOC needs have been identified at this time. We will continue to monitor patient advancement through interdisciplinary progression rounds. If new patient transition needs arise, please place a TOC consult.         Expected Discharge Plan and Services                                                 Social Determinants of Health (SDOH) Interventions    Readmission Risk Interventions No flowsheet data found.

## 2021-02-03 NOTE — Progress Notes (Signed)
PROGRESS NOTE    Darren Miller  OZD:[id number] DOB: 1985/11/19 DOA: 02/02/2021 PCP: Kirk Ruths, MD  150A/150A-AA   Assessment & Plan:   Principal Problem:   SOB (shortness of breath) Active Problems:   Prediabetes   Sepsis ([place])   Chest pain   Tobacco abuse   Darren Miller is a 36 y.o. male seen in ed with complaints of shortness of breath, generalized weakness, fever, nausea, headaches, neck pain all since Saturday. Chest pain body aches and elevated dimer at [facility] Initial d-dimer was 1780 and second at [facility]- 6525. Pt had high bp and headache. Fever started 6 days. Chest pain sun/mon- Located in rt lower chest . 7/10, no nausea or vomiting. NR, no aggravating or alleviating facto's.    Patient went to urgent care at [facility] where with RN to have an elevated D-dimer and a CTA chest was done on 01/31/2020 and found to be negative for pulmonary embolism. Patient was also given doxycycline for presumed tickborne illnesses x 3 days. Has h/o tick fever in past.    Patient had COVID about a month ago. Patient had an LP in the emergency room which was neg for bacterial infection.   Fever --up to 101.2.  no leukocytosis, procal neg, currently no source of bacterial infection. --started on empiric vanc/cefe/flagyl --d/c empiric abx --pending viral labs  Chest pain, non-cardiac --trop neg.   SIRS Fever, tachycardia, but currently no source of infection.   Tobacco abuse:  Nicotine patch.    DVT prophylaxis: Lovenox SQ Code Status: Full code  Family Communication: wife updated at bedside today  Level of care: Med-Surg Dispo:   The patient is from: home Anticipated d/c is to: home Anticipated d/c date is: 1-2 days Patient currently is not medically ready to d/c due to: explained fever   Subjective and Interval History:  Pt reported feeling better today.   Objective: Vitals:   02/03/21 0801 02/03/21 1120 02/03/21 1544 02/03/21 1930  BP:  132/84 117/76 130/90 119/65  Pulse: 70 76 80 86  Resp: 17 18 16 19   Temp: 99 F (37.2 C) 98.7 F (37.1 C) 98.4 F (36.9 C) 99.5 F (37.5 C)  TempSrc:   Oral   SpO2: 98% 98% 99% 100%  Weight:        Intake/Output Summary (Last 24 hours) at 02/03/2021 2223 Last data filed at 02/03/2021 1300 Gross per 24 hour  Intake 1993.99 ml  Output 1175 ml  Net 818.99 ml   Filed Weights   02/03/21 0700  Weight: 98.4 kg    Examination:   Constitutional: NAD, AAOx3 HEENT: conjunctivae and lids normal, EOMI CV: No cyanosis.   RESP: normal respiratory effort, on RA Extremities: No effusions, edema in BLE SKIN: warm, dry Neuro: II - XII grossly intact.   Psych: Normal mood and affect.  Appropriate judgement and reason   Data Reviewed: I have personally reviewed following labs and imaging studies  CBC: Recent Labs  Lab 01/30/21 0937 02/02/21 1827 02/03/21 0800  WBC 4.5 5.1 4.6  NEUTROABS 3.3 2.1  --   HGB 16.3 16.4 13.5  HCT 48.2 46.6 38.1*  MCV 85.2 81.8 80.9  PLT 244 167 259   Basic Metabolic Panel: Recent Labs  Lab 01/30/21 0937 02/02/21 1827 02/03/21 0800  NA 135 131* 134*  K 3.9 3.3* 3.2*  CL 100 94* 103  CO2 25 24 25   GLUCOSE 113* 101* 91  BUN 16 14 14   CREATININE 1.26* 1.12 1.06  CALCIUM 9.5 9.5 8.3*  MG  --  1.9 1.9   GFR: Estimated Creatinine Clearance: 112.3 mL/min (by C-G formula based on SCr of 1.06 mg/dL). Liver Function Tests: Recent Labs  Lab 01/30/21 0937 02/02/21 1827  AST 35 63*  ALT 36 60*  ALKPHOS 81 73  BILITOT 0.5 0.6  PROT 8.5* 8.2*  ALBUMIN 4.7 4.2   No results for input(s): LIPASE, AMYLASE in the last 168 hours. No results for input(s): AMMONIA in the last 168 hours. Coagulation Profile: Recent Labs  Lab 02/02/21 1827 02/03/21 1030  INR 1.0 1.0   Cardiac Enzymes: Recent Labs  Lab 02/03/21 0800  CKTOTAL 121   BNP (last 3 results) No results for input(s): PROBNP in the last 8760 hours. HbA1C: No results for input(s):  HGBA1C in the last 72 hours. CBG: Recent Labs  Lab 01/30/21 1000  GLUCAP 95   Lipid Profile: No results for input(s): CHOL, HDL, LDLCALC, TRIG, CHOLHDL, LDLDIRECT in the last 72 hours. Thyroid Function Tests: No results for input(s): TSH, T4TOTAL, FREET4, T3FREE, THYROIDAB in the last 72 hours. Anemia Panel: No results for input(s): VITAMINB12, FOLATE, FERRITIN, TIBC, IRON, RETICCTPCT in the last 72 hours. Sepsis Labs: Recent Labs  Lab 02/02/21 1827 02/02/21 2002 02/03/21 0800  PROCALCITON 0.11  --  <0.10  LATICACIDVEN 2.0* 1.3  --     Recent Results (from the past 240 hour(s))  Resp Panel by RT-PCR (Flu A&B, Covid) Nasopharyngeal Swab     Status: None   Collection Time: 01/30/21 10:26 AM   Specimen: Nasopharyngeal Swab; Nasopharyngeal(NP) swabs in vial transport medium  Result Value Ref Range Status   SARS Coronavirus 2 by RT PCR NEGATIVE NEGATIVE Final    Comment: (NOTE) SARS-CoV-2 target nucleic acids are NOT DETECTED.  The SARS-CoV-2 RNA is generally detectable in upper respiratory specimens during the acute phase of infection. The lowest concentration of SARS-CoV-2 viral copies this assay can detect is 138 copies/mL. A negative result does not preclude SARS-Cov-2 infection and should not be used as the sole basis for treatment or other patient management decisions. A negative result may occur with  improper specimen collection/handling, submission of specimen other than nasopharyngeal swab, presence of viral mutation(s) within the areas targeted by this assay, and inadequate number of viral copies(<138 copies/mL). A negative result must be combined with clinical observations, patient history, and epidemiological information. The expected result is Negative.  Fact Sheet for Patients:  EntrepreneurPulse.com.au  Fact Sheet for Healthcare Providers:  IncredibleEmployment.be  This test is no t yet approved or cleared by the [place] FDA and  has been authorized for detection and/or diagnosis of SARS-CoV-2 by FDA under an Emergency Use Authorization (EUA). This EUA will remain  in effect (meaning this test can be used) for the duration of the COVID-19 declaration under Section 564(b)(1) of the Act, 21 U.S.C.section 360bbb-3(b)(1), unless the authorization is terminated  or revoked sooner.       Influenza A by PCR NEGATIVE NEGATIVE Final   Influenza B by PCR NEGATIVE NEGATIVE Final    Comment: (NOTE) The Xpert Xpress SARS-CoV-2/FLU/RSV plus assay is intended as an aid in the diagnosis of influenza from Nasopharyngeal swab specimens and should not be used as a sole basis for treatment. Nasal washings and aspirates are unacceptable for Xpert Xpress SARS-CoV-2/FLU/RSV testing.  Fact Sheet for Patients: EntrepreneurPulse.com.au  Fact Sheet for Healthcare Providers: IncredibleEmployment.be  This test is not yet approved or cleared by the [place] and has been authorized  for detection and/or diagnosis of SARS-CoV-2 by FDA under an Emergency Use Authorization (EUA). This EUA will remain in effect (meaning this test can be used) for the duration of the COVID-19 declaration under Section 564(b)(1) of the Act, 21 U.S.C. section 360bbb-3(b)(1), unless the authorization is terminated or revoked.  Performed at [facility], [address] [place], [place] [address]   Blood Culture (routine x 2)     Status: None (Preliminary result)   Collection Time: 02/02/21  6:28 PM   Specimen: BLOOD  Result Value Ref Range Status   Specimen Description BLOOD RIGHT ANTECUBITAL  Final   Special Requests   Final    BOTTLES DRAWN AEROBIC AND ANAEROBIC Blood Culture adequate volume   Culture   Final    NO GROWTH < 12 HOURS Performed at [facility], [address] [place], [place] [address]    Report Status PENDING  Incomplete  Resp Panel by RT-PCR (Flu  A&B, Covid) Nasopharyngeal Swab     Status: None   Collection Time: 02/02/21  8:02 PM   Specimen: Nasopharyngeal Swab; Nasopharyngeal(NP) swabs in vial transport medium  Result Value Ref Range Status   SARS Coronavirus 2 by RT PCR NEGATIVE NEGATIVE Final    Comment: (NOTE) SARS-CoV-2 target nucleic acids are NOT DETECTED.  The SARS-CoV-2 RNA is generally detectable in upper respiratory specimens during the acute phase of infection. The lowest concentration of SARS-CoV-2 viral copies this assay can detect is 138 copies/mL. A negative result does not preclude SARS-Cov-2 infection and should not be used as the sole basis for treatment or other patient management decisions. A negative result may occur with  improper specimen collection/handling, submission of specimen other than nasopharyngeal swab, presence of viral mutation(s) within the areas targeted by this assay, and inadequate number of viral copies(<138 copies/mL). A negative result must be combined with clinical observations, patient history, and epidemiological information. The expected result is Negative.  Fact Sheet for Patients:  EntrepreneurPulse.com.au  Fact Sheet for Healthcare Providers:  IncredibleEmployment.be  This test is no t yet approved or cleared by the [place] FDA and  has been authorized for detection and/or diagnosis of SARS-CoV-2 by FDA under an Emergency Use Authorization (EUA). This EUA will remain  in effect (meaning this test can be used) for the duration of the COVID-19 declaration under Section 564(b)(1) of the Act, 21 U.S.C.section 360bbb-3(b)(1), unless the authorization is terminated  or revoked sooner.       Influenza A by PCR NEGATIVE NEGATIVE Final   Influenza B by PCR NEGATIVE NEGATIVE Final    Comment: (NOTE) The Xpert Xpress SARS-CoV-2/FLU/RSV plus assay is intended as an aid in the diagnosis of influenza from Nasopharyngeal swab specimens  and should not be used as a sole basis for treatment. Nasal washings and aspirates are unacceptable for Xpert Xpress SARS-CoV-2/FLU/RSV testing.  Fact Sheet for Patients: EntrepreneurPulse.com.au  Fact Sheet for Healthcare Providers: IncredibleEmployment.be  This test is not yet approved or cleared by the [place] FDA and has been authorized for detection and/or diagnosis of SARS-CoV-2 by FDA under an Emergency Use Authorization (EUA). This EUA will remain in effect (meaning this test can be used) for the duration of the COVID-19 declaration under Section 564(b)(1) of the Act, 21 U.S.C. section 360bbb-3(b)(1), unless the authorization is terminated or revoked.  Performed at [facility], [address] [place], [place] [address]   Blood Culture (routine x 2)     Status: None (Preliminary result)  Collection Time: 02/02/21  8:02 PM   Specimen: BLOOD  Result Value Ref Range Status   Specimen Description BLOOD BLOOD RIGHT FOREARM  Final   Special Requests   Final    BOTTLES DRAWN AEROBIC AND ANAEROBIC Blood Culture results may not be optimal due to an inadequate volume of blood received in culture bottles   Culture   Final    NO GROWTH < 12 HOURS Performed at [facility], [address], [place], [place] [address]    Report Status PENDING  Incomplete  CSF culture w Gram Stain     Status: None (Preliminary result)   Collection Time: 02/02/21  8:43 PM   Specimen: CSF; Cerebrospinal Fluid  Result Value Ref Range Status   Specimen Description   Final    CSF Performed at [facility], [address] [place], [place] [address]    Special Requests   Final    NONE Performed at [facility], [address], [place], [place] [address]    Gram Stain   Final    WBC SEEN RED BLOOD CELLS NO ORGANISMS SEEN Performed at [facility], [address] [place], [place] [address]    Culture    Final    NO GROWTH < 12 HOURS Performed at [facility], [place] [address], [place], [place] [address]    Report Status PENDING  Incomplete      Radiology Studies: DG Chest Port 1 View  Result Date: 02/02/2021 CLINICAL DATA:  Sepsis EXAM: PORTABLE CHEST 1 VIEW COMPARISON:  01/30/2021 FINDINGS: The cardiac silhouette, mediastinal and hilar contours are normal. Low lung volumes with vascular crowding and streaky basilar atelectasis but no infiltrates, edema or effusions. The bony thorax is intact. IMPRESSION: Low lung volumes with vascular crowding and streaky basilar atelectasis. Electronically Signed   By: Marijo Sanes M.D.   On: 02/02/2021 18:59   US Abdomen Limited RUQ (LIVER/GB)  Result Date: 02/03/2021 CLINICAL DATA:  Right upper quadrant pain EXAM: ULTRASOUND ABDOMEN LIMITED RIGHT UPPER QUADRANT COMPARISON:  None. FINDINGS: Gallbladder: No gallstones or wall thickening visualized. No sonographic Murphy sign noted by sonographer. Common bile duct: Diameter: 3.7 mm. Liver: No focal lesion identified. Within normal limits in parenchymal echogenicity. Portal vein is patent on color Doppler imaging with normal direction of blood flow towards the liver. Other: None. IMPRESSION: No acute abnormality noted. Electronically Signed   By: Inez Catalina M.D.   On: 02/03/2021 02:16     Scheduled Meds:  heparin injection (subcutaneous)  5,000 Units Subcutaneous Q8H   melatonin  5 mg Oral QHS   pantoprazole (PROTONIX) IV  40 mg Intravenous Q12H   Continuous Infusions:   LOS: 0 days     Enzo Bi, MD [facility] If 7PM-7AM, please contact night-coverage 02/03/2021, 10:23 PM

## 2021-02-03 NOTE — Plan of Care (Signed)

## 2021-02-03 NOTE — Progress Notes (Signed)
Pharmacy Antibiotic Note  Darren Miller is a 36 y.o. male admitted on 02/02/2021 with sepsis.  Pharmacy has been consulted for Cefepime, Vancomycin dosing.  Plan: Cefepime 2 gm IV Q8H ordered   2.  Vancomycin 2 gm IV X 1 load given in ED, then vancomycin 720mg  IV Q8H  Goal AUC 400-550. Expected AUC: 530 SCr used: 1.06  Weight: 98.4 kg (217 lb)  Temp (24hrs), Avg:99.3 F (37.4 C), Min:97.9 F (36.6 C), Max:101.2 F (38.4 C)  Recent Labs  Lab 01/30/21 0937 02/02/21 1827 02/02/21 2002 02/03/21 0800  WBC 4.5 5.1  --  4.6  CREATININE 1.26* 1.12  --  1.06  LATICACIDVEN  --  2.0* 1.3  --      Estimated Creatinine Clearance: 112.3 mL/min (by C-G formula based on SCr of 1.06 mg/dL).    Allergies  Allergen Reactions   Alpha-Gal     Meat allergy, pt says all symptons subsided after acupuncture     Antimicrobials this admission:  Cefepime 1/19 >>   Vancomycin 1/19 >>   Metronidazole 1/19>>  Dose adjustments this admission: none  Microbiology results:  1/19 BCx: NG x 12 hours  1/19 CSF: no organisms seen  1/20 MRSA PCR: pending collection  Thank you for allowing pharmacy to be a part of this patients care.  Keavon Sensing Rodriguez-Guzman PharmD, BCPS 02/03/2021 11:17 AM

## 2021-02-03 NOTE — Plan of Care (Signed)
Patient arrived to room 150 in stable condition. Aox4. Reports posterior neck pain and headache. Room air. Plan of care reviewed with patient and spouse. Room/unit orientation completed. Call bell within reach.  PLAN OF CARE ONGOING Problem: Education: Goal: Knowledge of General Education information will improve Description: Including pain rating scale, medication(s)/side effects and non-pharmacologic comfort measures Outcome: Progressing   Problem: Health Behavior/Discharge Planning: Goal: Ability to manage health-related needs will improve Outcome: Progressing   Problem: Clinical Measurements: Goal: Ability to maintain clinical measurements within normal limits will improve Outcome: Progressing Goal: Will remain free from infection Outcome: Progressing Goal: Diagnostic test results will improve Outcome: Progressing Goal: Respiratory complications will improve Outcome: Progressing Goal: Cardiovascular complication will be avoided Outcome: Progressing   Problem: Activity: Goal: Risk for activity intolerance will decrease Outcome: Progressing   Problem: Nutrition: Goal: Adequate nutrition will be maintained Outcome: Progressing   Problem: Coping: Goal: Level of anxiety will decrease Outcome: Progressing   Problem: Elimination: Goal: Will not experience complications related to bowel motility Outcome: Progressing Goal: Will not experience complications related to urinary retention Outcome: Progressing   Problem: Pain Managment: Goal: General experience of comfort will improve Outcome: Progressing   Problem: Safety: Goal: Ability to remain free from injury will improve Outcome: Progressing   Problem: Skin Integrity: Goal: Risk for impaired skin integrity will decrease Outcome: Progressing

## 2021-02-03 NOTE — Progress Notes (Signed)
Anticoagulation monitoring(Lovenox):  36 yo male ordered Lovenox 40 mg Q24h    Filed Weights   02/03/21 0700  Weight: 98.4 kg (217 lb)   BMI 31    Lab Results  Component Value Date   CREATININE 1.06 02/03/2021   CREATININE 1.12 02/02/2021   CREATININE 1.26 (H) 01/30/2021   Estimated Creatinine Clearance: 112.3 mL/min (by C-G formula based on SCr of 1.06 mg/dL). Hemoglobin & Hematocrit     Component Value Date/Time   HGB 13.5 02/03/2021 0800   HCT 38.1 (L) 02/03/2021 0800     Per Protocol for Patient with estCrcl > 30 ml/min and BMI > 30, will transition to Lovenox 50 mg Q24h.

## 2021-02-03 NOTE — Progress Notes (Addendum)
Pharmacy Antibiotic Note  Darren Miller is a 36 y.o. male admitted on 02/02/2021 with sepsis.  Pharmacy has been consulted for Cefepime, Vancomycin dosing.  Plan: Cefepime 2 gm IV X 1 given in ED on 1/19 @ 1911. Cefepime 2 gm IV Q8H ordered to continue on 1/20 @ 300.   Vancomycin 2 gm IV X 1 given in ED on 1/19 @ 2034. Vancomycin 2 gm IV Q12H ordered to continue on 1/21 @ 2000.  AUC = 521 Vanc trough = 15     Temp (24hrs), Avg:99.7 F (37.6 C), Min:97.9 F (36.6 C), Max:101.2 F (38.4 C)  Recent Labs  Lab 01/30/21 0937 02/02/21 1827 02/02/21 2002  WBC 4.5 5.1  --   CREATININE 1.26* 1.12  --   LATICACIDVEN  --  2.0* 1.3    Estimated Creatinine Clearance: 109.2 mL/min (by C-G formula based on SCr of 1.12 mg/dL).    Allergies  Allergen Reactions   Alpha-Gal     Meat allergy, pt says all symptons subsided after acupuncture     Antimicrobials this admission:   >>    >>   Dose adjustments this admission:   Microbiology results:  BCx:   UCx:    Sputum:    MRSA PCR:   Thank you for allowing pharmacy to be a part of this patients care.  Darren Miller D 02/03/2021 12:14 AM

## 2021-02-04 ENCOUNTER — Inpatient Hospital Stay: Payer: No Typology Code available for payment source

## 2021-02-04 LAB — MRSA NEXT GEN BY PCR, NASAL: MRSA by PCR Next Gen: NOT DETECTED

## 2021-02-04 LAB — CBC
HCT: 40.3 % (ref 39.0–52.0)
Hemoglobin: 14 g/dL (ref 13.0–17.0)
MCH: 28.2 pg (ref 26.0–34.0)
MCHC: 34.7 g/dL (ref 30.0–36.0)
MCV: 81.3 fL (ref 80.0–100.0)
Platelets: 184 10*3/uL (ref 150–400)
RBC: 4.96 MIL/uL (ref 4.22–5.81)
RDW: 12.4 % (ref 11.5–15.5)
WBC: 6.5 10*3/uL (ref 4.0–10.5)
nRBC: 0 % (ref 0.0–0.2)

## 2021-02-04 LAB — BASIC METABOLIC PANEL
Anion gap: 14 (ref 5–15)
BUN: 10 mg/dL (ref 6–20)
CO2: 21 mmol/L — ABNORMAL LOW (ref 22–32)
Calcium: 8.7 mg/dL — ABNORMAL LOW (ref 8.9–10.3)
Chloride: 99 mmol/L (ref 98–111)
Creatinine, Ser: 0.9 mg/dL (ref 0.61–1.24)
GFR, Estimated: >60 mL/min (ref >60)
Glucose, Bld: 85 mg/dL (ref 70–99)
Potassium: 3.7 mmol/L (ref 3.5–5.1)
Sodium: 134 mmol/L — ABNORMAL LOW (ref 135–145)

## 2021-02-04 LAB — EPSTEIN-BARR VIRUS (EBV) ANTIBODY PROFILE
EBV NA IgG: >600 U/mL — ABNORMAL HIGH (ref 0.0–17.9)
EBV VCA IgG: 476 U/mL — ABNORMAL HIGH (ref 0.0–17.9)
EBV VCA IgM: <36 U/mL (ref 0.0–35.9)

## 2021-02-04 LAB — URINE CULTURE: Culture: NO GROWTH

## 2021-02-04 LAB — MAGNESIUM: Magnesium: 2.1 mg/dL (ref 1.7–2.4)

## 2021-02-04 MED ORDER — TRAMADOL HCL 50 MG PO TABS
50.0000 mg | ORAL_TABLET | Freq: Once | ORAL | Status: AC
Start: 2021-02-04 — End: 2021-02-04
  Administered 2021-02-04: 50 mg via ORAL
  Filled 2021-02-04: qty 1

## 2021-02-04 MED ORDER — PROCHLORPERAZINE EDISYLATE 10 MG/2ML IJ SOLN
10.0000 mg | Freq: Once | INTRAMUSCULAR | Status: DC
  Filled 2021-02-04: qty 2

## 2021-02-04 MED ORDER — DIPHENHYDRAMINE HCL 50 MG/ML IJ SOLN
25.0000 mg | Freq: Once | INTRAMUSCULAR | Status: DC
  Filled 2021-02-04: qty 1

## 2021-02-04 NOTE — Discharge Summary (Addendum)
Physician Discharge Summary   Darren Miller  male DOB: Aug 14, 1985  WGN:[id number]  PCP: Kirk Ruths, MD  Admit date: 02/02/2021 Discharge date: 02/04/2021  Admitted From: home Disposition:  home CODE STATUS: Full code   Hospital Course:  For full details, please see H&P, progress notes, consult notes and ancillary notes.  Briefly,  Darren Miller is a 36 y.o. male seen in ed with complaints of shortness of breath, generalized weakness, fever, nausea, headaches, neck pain, chest pain body aches.  Had elevated dimer at [facility], had CTA chest done that was neg for PE and any other acute process.  Patient was also given doxycycline for presumed tickborne illnesses x 3 days. Has h/o tick fever in past.  Patient had COVID about a month ago, COVID neg on current admission.  Pt had extensive workup which all returned neg.  I offered to consult ID, however, pt was frustrated with not being able to find the etiology of his symptoms, and therefore asked to be discharged before ID can be consulted.  Fever --101.2 on presentation.  no leukocytosis, procal neg, urine cx neg growth, CXR neg for infection, [place] RUQ no acute finding.  COVID neg, RVP neg. --Patient had an LP in the emergency room which was neg for bacterial infection.  HSV DNA neg in CSF. --EBV IgM neg, IgG pos.   --Mono neg, RPR neg, [place] spotted fever neg, Lyme only 2 ab present so considered neg, HIV neg.   --started on empiric vanc/cefe/flagyl on presentation, but didn't continue since no source of bacterial infection.   Chest pain, non-cardiac --trop neg.   SIRS Fever, tachycardia, but currently no source of infection.  Headache --later reported as the main complaint.  Headache eased off with Fioricet, but then returned. --CT head wnl.   Tobacco abuse:  Nicotine patch.  GERD --cont home PPI   Discharge Diagnoses:  Principal Problem:   SOB (shortness of breath) Active Problems:    Prediabetes   Sepsis (HCC)   Chest pain   Tobacco abuse   30 Day Unplanned Readmission Risk Score    Flowsheet Row ED to Hosp-Admission (Current) from 02/02/2021 in [facility] (1A)  30 Day Unplanned Readmission Risk Score (%) 10.02 Filed at 02/04/2021 1600       This score is the patient's risk of an unplanned readmission within 30 days of being discharged (0 -100%). The score is based on dignosis, age, lab data, medications, orders, and past utilization.   Low:  0-14.9   Medium: 15-21.9   High: 22-29.9   Extreme: 30 and above         Discharge Instructions:  Allergies as of 02/04/2021       Reactions   Alpha-gal    Meat allergy, pt says all symptons subsided after acupuncture         Medication List     STOP taking these medications    aspirin EC 325 MG tablet   cephALEXin 500 MG capsule Commonly known as: KEFLEX   cyclobenzaprine 10 MG tablet Commonly known as: FLEXERIL   HYDROcodone-acetaminophen 5-325 MG tablet Commonly known as: Norco   meloxicam 15 MG tablet Commonly known as: MOBIC   ondansetron 4 MG tablet Commonly known as: Zofran   oxyCODONE-acetaminophen 5-325 MG tablet Commonly known as: PERCOCET/ROXICET   sulfamethoxazole-trimethoprim 800-160 MG tablet Commonly known as: BACTRIM DS   tadalafil 5 MG tablet Commonly known as: CIALIS   traMADol 50 MG tablet  Commonly known as: ULTRAM   traZODone 50 MG tablet Commonly known as: DESYREL       TAKE these medications    acetaminophen 325 MG tablet Commonly known as: TYLENOL Take 650 mg by mouth every 6 (six) hours as needed for headache.   amphetamine-dextroamphetamine 20 MG tablet Commonly known as: ADDERALL Take 1 tablet (20 mg total) by mouth 2 times daily What changed: Another medication with the same name was removed. Continue taking this medication, and follow the directions you see here.   doxycycline 100 MG tablet Commonly known as:  VIBRA-TABS Take 1 tablet by mouth two times daily for 7 days   EPINEPHrine 0.3 mg/0.3 mL Soaj injection Commonly known as: EPI-PEN Inject 0.3 mg into the muscle as needed for anaphylaxis.   famotidine 20 MG tablet Commonly known as: PEPCID TAKE 1 TABLET BY MOUTH TWO TIMES DAILY AS NEEDED FOR HEARTBURN   ibuprofen 200 MG tablet Commonly known as: ADVIL Take 400 mg by mouth every 6 (six) hours as needed for headache.   pantoprazole 40 MG tablet Commonly known as: PROTONIX TAKE 1 TABLET BY MOUTH ONCE DAILY What changed: Another medication with the same name was removed. Continue taking this medication, and follow the directions you see here.          Allergies  Allergen Reactions   Alpha-Gal     Meat allergy, pt says all symptons subsided after acupuncture      The results of significant diagnostics from this hospitalization (including imaging, microbiology, ancillary and laboratory) are listed below for reference.   Consultations:   Procedures/Studies: DG Chest 2 View  Result Date: 01/30/2021 CLINICAL DATA:  Shortness of breath, blurry vision, chills, sweats, loss of appetite, lower chest pain. EXAM: CHEST - 2 VIEW COMPARISON:  05/17/2018. FINDINGS: Trachea is midline. Heart size normal. Lungs are clear. No pleural fluid. IMPRESSION: No acute findings. Electronically Signed   By: Lorin Picket M.D.   On: 01/30/2021 10:12   CT HEAD WO CONTRAST (5MM)  Result Date: 02/04/2021 CLINICAL DATA:  Headache EXAM: CT HEAD WITHOUT CONTRAST TECHNIQUE: Contiguous axial images were obtained from the base of the skull through the vertex without intravenous contrast. RADIATION DOSE REDUCTION: This exam was performed according to the departmental dose-optimization program which includes automated exposure control, adjustment of the mA and/or kV according to patient size and/or use of iterative reconstruction technique. COMPARISON:  None. FINDINGS: Brain: No evidence of acute infarction,  hemorrhage, hydrocephalus, extra-axial collection or mass lesion/mass effect. Vascular: Negative for hyperdense vessel Skull: Negative Sinuses/Orbits: Paranasal sinuses clear.  Negative orbit Other: None IMPRESSION: Negative CT head Electronically Signed   By: Franchot Gallo M.D.   On: 02/04/2021 17:37   DG Chest Port 1 View  Result Date: 02/02/2021 CLINICAL DATA:  Sepsis EXAM: PORTABLE CHEST 1 VIEW COMPARISON:  01/30/2021 FINDINGS: The cardiac silhouette, mediastinal and hilar contours are normal. Low lung volumes with vascular crowding and streaky basilar atelectasis but no infiltrates, edema or effusions. The bony thorax is intact. IMPRESSION: Low lung volumes with vascular crowding and streaky basilar atelectasis. Electronically Signed   By: Marijo Sanes M.D.   On: 02/02/2021 18:59   US Abdomen Limited RUQ (LIVER/GB)  Result Date: 02/03/2021 CLINICAL DATA:  Right upper quadrant pain EXAM: ULTRASOUND ABDOMEN LIMITED RIGHT UPPER QUADRANT COMPARISON:  None. FINDINGS: Gallbladder: No gallstones or wall thickening visualized. No sonographic Murphy sign noted by sonographer. Common bile duct: Diameter: 3.7 mm. Liver: No focal lesion identified. Within normal limits in  parenchymal echogenicity. Portal vein is patent on color Doppler imaging with normal direction of blood flow towards the liver. Other: None. IMPRESSION: No acute abnormality noted. Electronically Signed   By: Inez Catalina M.D.   On: 02/03/2021 02:16      Labs: BNP (last 3 results) No results for input(s): BNP in the last 8760 hours. Basic Metabolic Panel: Recent Labs  Lab 01/30/21 0937 02/02/21 1827 02/03/21 0800 02/04/21 0458  NA 135 131* 134* 134*  K 3.9 3.3* 3.2* 3.7  CL 100 94* 103 99  CO2 25 24 25  21*  GLUCOSE 113* 101* 91 85  BUN 16 14 14 10   CREATININE 1.26* 1.12 1.06 0.90  CALCIUM 9.5 9.5 8.3* 8.7*  MG  --  1.9 1.9 2.1   Liver Function Tests: Recent Labs  Lab 01/30/21 0937 02/02/21 1827  AST 35 63*  ALT 36  60*  ALKPHOS 81 73  BILITOT 0.5 0.6  PROT 8.5* 8.2*  ALBUMIN 4.7 4.2   No results for input(s): LIPASE, AMYLASE in the last 168 hours. No results for input(s): AMMONIA in the last 168 hours. CBC: Recent Labs  Lab 01/30/21 0937 02/02/21 1827 02/03/21 0800 02/04/21 0458  WBC 4.5 5.1 4.6 6.5  NEUTROABS 3.3 2.1  --   --   HGB 16.3 16.4 13.5 14.0  HCT 48.2 46.6 38.1* 40.3  MCV 85.2 81.8 80.9 81.3  PLT 244 167 154 184   Cardiac Enzymes: Recent Labs  Lab 02/03/21 0800  CKTOTAL 121   BNP: Invalid input(s): POCBNP CBG: Recent Labs  Lab 01/30/21 1000  GLUCAP 95   D-Dimer No results for input(s): DDIMER in the last 72 hours. Hgb A1c No results for input(s): HGBA1C in the last 72 hours. Lipid Profile No results for input(s): CHOL, HDL, LDLCALC, TRIG, CHOLHDL, LDLDIRECT in the last 72 hours. Thyroid function studies No results for input(s): TSH, T4TOTAL, T3FREE, THYROIDAB in the last 72 hours.  Invalid input(s): FREET3 Anemia work up No results for input(s): VITAMINB12, FOLATE, FERRITIN, TIBC, IRON, RETICCTPCT in the last 72 hours. Urinalysis    Component Value Date/Time   COLORURINE STRAW (A) 02/03/2021 0009   APPEARANCEUR CLEAR (A) 02/03/2021 0009   LABSPEC 1.006 02/03/2021 0009   PHURINE 6.0 02/03/2021 0009   GLUCOSEU NEGATIVE 02/03/2021 0009   HGBUR NEGATIVE 02/03/2021 0009   BILIRUBINUR NEGATIVE 02/03/2021 0009   KETONESUR 20 (A) 02/03/2021 0009   PROTEINUR NEGATIVE 02/03/2021 0009   NITRITE NEGATIVE 02/03/2021 0009   LEUKOCYTESUR NEGATIVE 02/03/2021 0009   Sepsis Labs Invalid input(s): PROCALCITONIN,  WBC,  LACTICIDVEN Microbiology Recent Results (from the past 240 hour(s))  Resp Panel by RT-PCR (Flu A&B, Covid) Nasopharyngeal Swab     Status: None   Collection Time: 01/30/21 10:26 AM   Specimen: Nasopharyngeal Swab; Nasopharyngeal(NP) swabs in vial transport medium  Result Value Ref Range Status   SARS Coronavirus 2 by RT PCR NEGATIVE NEGATIVE Final     Comment: (NOTE) SARS-CoV-2 target nucleic acids are NOT DETECTED.  The SARS-CoV-2 RNA is generally detectable in upper respiratory specimens during the acute phase of infection. The lowest concentration of SARS-CoV-2 viral copies this assay can detect is 138 copies/mL. A negative result does not preclude SARS-Cov-2 infection and should not be used as the sole basis for treatment or other patient management decisions. A negative result may occur with  improper specimen collection/handling, submission of specimen other than nasopharyngeal swab, presence of viral mutation(s) within the areas targeted by this assay, and inadequate number of  viral copies(<138 copies/mL). A negative result must be combined with clinical observations, patient history, and epidemiological information. The expected result is Negative.  Fact Sheet for Patients:  EntrepreneurPulse.com.au  Fact Sheet for Healthcare Providers:  IncredibleEmployment.be  This test is no t yet approved or cleared by the [place] FDA and  has been authorized for detection and/or diagnosis of SARS-CoV-2 by FDA under an Emergency Use Authorization (EUA). This EUA will remain  in effect (meaning this test can be used) for the duration of the COVID-19 declaration under Section 564(b)(1) of the Act, 21 U.S.C.section 360bbb-3(b)(1), unless the authorization is terminated  or revoked sooner.       Influenza A by PCR NEGATIVE NEGATIVE Final   Influenza B by PCR NEGATIVE NEGATIVE Final    Comment: (NOTE) The Xpert Xpress SARS-CoV-2/FLU/RSV plus assay is intended as an aid in the diagnosis of influenza from Nasopharyngeal swab specimens and should not be used as a sole basis for treatment. Nasal washings and aspirates are unacceptable for Xpert Xpress SARS-CoV-2/FLU/RSV testing.  Fact Sheet for Patients: EntrepreneurPulse.com.au  Fact Sheet for Healthcare  Providers: IncredibleEmployment.be  This test is not yet approved or cleared by the [place] FDA and has been authorized for detection and/or diagnosis of SARS-CoV-2 by FDA under an Emergency Use Authorization (EUA). This EUA will remain in effect (meaning this test can be used) for the duration of the COVID-19 declaration under Section 564(b)(1) of the Act, 21 U.S.C. section 360bbb-3(b)(1), unless the authorization is terminated or revoked.  Performed at [facility], [address], [place], [place] [address]   Blood Culture (routine x 2)     Status: None (Preliminary result)   Collection Time: 02/02/21  6:28 PM   Specimen: BLOOD  Result Value Ref Range Status   Specimen Description BLOOD RIGHT ANTECUBITAL  Final   Special Requests   Final    BOTTLES DRAWN AEROBIC AND ANAEROBIC Blood Culture adequate volume   Culture   Final    NO GROWTH 2 DAYS Performed at [facility], [address] [place], [place] [address]    Report Status PENDING  Incomplete  Resp Panel by RT-PCR (Flu A&B, Covid) Nasopharyngeal Swab     Status: None   Collection Time: 02/02/21  8:02 PM   Specimen: Nasopharyngeal Swab; Nasopharyngeal(NP) swabs in vial transport medium  Result Value Ref Range Status   SARS Coronavirus 2 by RT PCR NEGATIVE NEGATIVE Final    Comment: (NOTE) SARS-CoV-2 target nucleic acids are NOT DETECTED.  The SARS-CoV-2 RNA is generally detectable in upper respiratory specimens during the acute phase of infection. The lowest concentration of SARS-CoV-2 viral copies this assay can detect is 138 copies/mL. A negative result does not preclude SARS-Cov-2 infection and should not be used as the sole basis for treatment or other patient management decisions. A negative result may occur with  improper specimen collection/handling, submission of specimen other than nasopharyngeal swab, presence of viral mutation(s) within the areas targeted by  this assay, and inadequate number of viral copies(<138 copies/mL). A negative result must be combined with clinical observations, patient history, and epidemiological information. The expected result is Negative.  Fact Sheet for Patients:  EntrepreneurPulse.com.au  Fact Sheet for Healthcare Providers:  IncredibleEmployment.be  This test is no t yet approved or cleared by the [place] FDA and  has been authorized for detection and/or diagnosis of SARS-CoV-2 by FDA under an Emergency Use Authorization (EUA). This EUA will remain  in effect (meaning this  test can be used) for the duration of the COVID-19 declaration under Section 564(b)(1) of the Act, 21 U.S.C.section 360bbb-3(b)(1), unless the authorization is terminated  or revoked sooner.       Influenza A by PCR NEGATIVE NEGATIVE Final   Influenza B by PCR NEGATIVE NEGATIVE Final    Comment: (NOTE) The Xpert Xpress SARS-CoV-2/FLU/RSV plus assay is intended as an aid in the diagnosis of influenza from Nasopharyngeal swab specimens and should not be used as a sole basis for treatment. Nasal washings and aspirates are unacceptable for Xpert Xpress SARS-CoV-2/FLU/RSV testing.  Fact Sheet for Patients: EntrepreneurPulse.com.au  Fact Sheet for Healthcare Providers: IncredibleEmployment.be  This test is not yet approved or cleared by the [place] FDA and has been authorized for detection and/or diagnosis of SARS-CoV-2 by FDA under an Emergency Use Authorization (EUA). This EUA will remain in effect (meaning this test can be used) for the duration of the COVID-19 declaration under Section 564(b)(1) of the Act, 21 U.S.C. section 360bbb-3(b)(1), unless the authorization is terminated or revoked.  Performed at [facility], [address] [place], [place] [address]   Blood Culture (routine x 2)     Status: None (Preliminary result)    Collection Time: 02/02/21  8:02 PM   Specimen: BLOOD  Result Value Ref Range Status   Specimen Description BLOOD BLOOD RIGHT FOREARM  Final   Special Requests   Final    BOTTLES DRAWN AEROBIC AND ANAEROBIC Blood Culture results may not be optimal due to an inadequate volume of blood received in culture bottles   Culture   Final    NO GROWTH 2 DAYS Performed at [facility], [address], [place], [place] [address]    Report Status PENDING  Incomplete  CSF culture w Gram Stain     Status: None (Preliminary result)   Collection Time: 02/02/21  8:43 PM   Specimen: CSF; Cerebrospinal Fluid  Result Value Ref Range Status   Specimen Description   Final    CSF Performed at [facility], [address] [place], [place] [address]    Special Requests   Final    NONE Performed at [facility], [address], [place], [place] [address]    Gram Stain   Final    WBC SEEN RED BLOOD CELLS NO ORGANISMS SEEN Performed at [facility], [address] [place], [place] [address]    Culture   Final    NO GROWTH 2 DAYS Performed at [facility], [place] [address], [place], [place] [address]    Report Status PENDING  Incomplete  Urine Culture     Status: None   Collection Time: 02/03/21 12:09 AM   Specimen: Urine, Random  Result Value Ref Range Status   Specimen Description   Final    URINE, RANDOM Performed at [facility], [address] [place], [place] [address]    Special Requests   Final    NONE Performed at [facility], [address], [place], [place] [address]    Culture   Final    NO GROWTH Performed at [facility], [place] [address] [place],  [address]    Report Status 02/04/2021 FINAL  Final     Total time spend on discharging this patient, including the last patient exam, discussing the hospital stay, instructions for ongoing care as it relates to all pertinent caregivers, as well as preparing  the medical discharge records, prescriptions, and/or  referrals as applicable, is 45 minutes.    Enzo Bi, MD  [facility] 02/04/2021, 6:35 PM

## 2021-02-04 NOTE — Plan of Care (Signed)

## 2021-02-05 LAB — RESPIRATORY PANEL BY PCR

## 2021-02-05 LAB — HSV 1/2 PCR, CSF
HSV-1 DNA: NEGATIVE
HSV-2 DNA: NEGATIVE

## 2021-02-06 ENCOUNTER — Other Ambulatory Visit: Payer: Self-pay

## 2021-02-06 LAB — LYME DISEASE, WESTERN BLOT
IgG P18 Ab.: ABSENT
IgG P23 Ab.: ABSENT
IgG P28 Ab.: ABSENT
IgG P30 Ab.: ABSENT
IgG P45 Ab.: ABSENT
IgG P58 Ab.: ABSENT
IgG P66 Ab.: ABSENT
IgG P93 Ab.: ABSENT
IgM P23 Ab.: ABSENT
IgM P39 Ab.: ABSENT
IgM P41 Ab.: ABSENT
Lyme IgG Wb: NEGATIVE
Lyme IgM Wb: NEGATIVE

## 2021-02-06 LAB — CSF CULTURE W GRAM STAIN: Culture: NO GROWTH

## 2021-02-06 LAB — HEMOGLOBIN A1C
Hgb A1c MFr Bld: 5.7 % — ABNORMAL HIGH (ref 4.8–5.6)
Mean Plasma Glucose: 117 mg/dL

## 2021-02-06 LAB — ROCKY MTN SPOTTED FVR ABS PNL(IGG+IGM)
RMSF IgG: NEGATIVE
RMSF IgM: 0.22 index (ref 0.00–0.89)

## 2021-02-06 MED ORDER — MELOXICAM 15 MG PO TABS
ORAL_TABLET | ORAL | 0 refills | Status: DC
  Filled 2021-02-06: qty 14, 14d supply, fill #0

## 2021-02-06 MED ORDER — PREDNISONE 20 MG PO TABS
ORAL_TABLET | ORAL | 0 refills | Status: DC
  Filled 2021-02-06: qty 18, 9d supply, fill #0

## 2021-02-07 LAB — CULTURE, BLOOD (ROUTINE X 2)
Culture: NO GROWTH
Culture: NO GROWTH
Special Requests: ADEQUATE

## 2021-02-15 ENCOUNTER — Other Ambulatory Visit: Payer: Self-pay

## 2021-02-16 ENCOUNTER — Other Ambulatory Visit: Payer: Self-pay

## 2021-02-16 MED ORDER — AMPHETAMINE-DEXTROAMPHETAMINE 20 MG PO TABS
ORAL_TABLET | ORAL | 0 refills | Status: DC
  Filled 2021-02-16: qty 60, 30d supply, fill #0

## 2021-02-16 MED FILL — Pantoprazole Sodium EC Tab 40 MG (Base Equiv): ORAL | 90 days supply | Qty: 90 | Fill #0 | Status: AC

## 2021-02-28 ENCOUNTER — Other Ambulatory Visit: Payer: Self-pay

## 2021-02-28 MED ORDER — SUMATRIPTAN SUCCINATE 100 MG PO TABS
ORAL_TABLET | ORAL | 11 refills | Status: DC
  Filled 2021-02-28: qty 9, 30d supply, fill #0
  Filled 2021-07-20: qty 9, 15d supply, fill #0
  Filled 2021-10-29: qty 9, 15d supply, fill #1

## 2021-03-09 ENCOUNTER — Other Ambulatory Visit: Payer: Self-pay

## 2021-03-16 ENCOUNTER — Other Ambulatory Visit: Payer: Self-pay

## 2021-03-16 MED ORDER — PANTOPRAZOLE SODIUM 40 MG PO TBEC
DELAYED_RELEASE_TABLET | ORAL | 1 refills | Status: DC
  Filled 2021-03-16: qty 90, 90d supply, fill #0
  Filled 2021-05-17: qty 90, 90d supply, fill #1

## 2021-03-16 MED ORDER — AMPHETAMINE-DEXTROAMPHETAMINE 20 MG PO TABS
ORAL_TABLET | ORAL | 0 refills | Status: DC
  Filled 2021-03-16: qty 60, 30d supply, fill #0

## 2021-04-18 ENCOUNTER — Other Ambulatory Visit: Payer: Self-pay

## 2021-04-18 MED ORDER — AMPHETAMINE-DEXTROAMPHETAMINE 20 MG PO TABS
ORAL_TABLET | ORAL | 0 refills | Status: DC
  Filled 2021-04-18: qty 60, 30d supply, fill #0

## 2021-05-17 ENCOUNTER — Other Ambulatory Visit: Payer: Self-pay

## 2021-05-17 MED ORDER — FAMOTIDINE 20 MG PO TABS
ORAL_TABLET | ORAL | 0 refills | Status: DC
  Filled 2021-05-17: qty 60, 30d supply, fill #0

## 2021-05-17 MED ORDER — TADALAFIL 5 MG PO TABS
5.0000 mg | ORAL_TABLET | Freq: Every day | ORAL | 0 refills | Status: DC
  Filled 2021-05-17: qty 30, 30d supply, fill #0

## 2021-05-18 ENCOUNTER — Other Ambulatory Visit: Payer: Self-pay

## 2021-05-18 MED ORDER — AMPHETAMINE-DEXTROAMPHETAMINE 20 MG PO TABS
ORAL_TABLET | ORAL | 0 refills | Status: DC
  Filled 2021-05-18: qty 60, 30d supply, fill #0

## 2021-05-19 ENCOUNTER — Other Ambulatory Visit: Payer: Self-pay

## 2021-05-22 ENCOUNTER — Other Ambulatory Visit: Payer: Self-pay

## 2021-06-09 ENCOUNTER — Ambulatory Visit
Admission: EM | Admit: 2021-06-09 | Discharge: 2021-06-09 | Disposition: A | Discharge status: Home / Self Care | Payer: No Typology Code available for payment source | Attending: Physician Assistant | Admitting: Physician Assistant

## 2021-06-09 DIAGNOSIS — R591 Generalized enlarged lymph nodes: Secondary | ICD-10-CM | POA: Insufficient documentation

## 2021-06-09 DIAGNOSIS — J029 Acute pharyngitis, unspecified: Secondary | ICD-10-CM | POA: Insufficient documentation

## 2021-06-09 LAB — GROUP A STREP BY PCR: Group A Strep by PCR: NOT DETECTED

## 2021-06-09 NOTE — ED Triage Notes (Signed)
Pt states he also here concerned with reaction from tick bite about 2 weeks ago.

## 2021-06-09 NOTE — ED Provider Notes (Addendum)
[facility]    CSN: [id number] Arrival date & time: 06/09/21  0815      History   Chief Complaint Chief Complaint  Patient presents with   Sore Throat    HPI Darren Miller is a 36 y.o. male presenting for sore throat, voice hoarseness, and swollen lymph nodes that started yesterday.  Patient also reports body aches but says that does not count and COVID has body aches.  He denies associated fever, cough, congestion, ear pain, breathing difficulty, nausea/vomiting diarrhea.  Patient says there is always someone sick at work so he is not sure what anyone has.  He has not been taking any over-the-counter medicine for symptoms.  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    Past Surgical History:  Procedure Laterality Date   APPENDECTOMY     COLONOSCOPY WITH PROPOFOL N/A 11/19/2017   Procedure: COLONOSCOPY WITH PROPOFOL;  Surgeon: Jonathon Bellows, MD;  Location: [facility] ENDOSCOPY;  Service: Gastroenterology;  Laterality: N/A;   ESOPHAGOGASTRODUODENOSCOPY (EGD) WITH PROPOFOL N/A 11/19/2017   Procedure: ESOPHAGOGASTRODUODENOSCOPY (EGD) WITH PROPOFOL;  Surgeon: Jonathon Bellows, MD;  Location: [facility] ENDOSCOPY;  Service: Gastroenterology;  Laterality: N/A;   KNEE ARTHROSCOPY WITH MENISCAL REPAIR Right 07/16/2019   Procedure: RIGHT KNEE ARTHROSCOPY WITH PARTIAL MEDIAL MENISCECTOMY;  Surgeon: Thornton Park, MD;  Location: [facility] ORS;  Service: Orthopedics;  Laterality: Right;       Home Medications    Prior to Admission medications   Medication Sig Start Date End Date Taking? Authorizing Provider  acetaminophen (TYLENOL) 325 MG tablet Take 650 mg by  mouth every 6 (six) hours as needed for headache.    [provider]  amphetamine-dextroamphetamine (ADDERALL) 20 MG tablet Take 1 tablet (20 mg total) by mouth 2 times daily 05/18/21     doxycycline (VIBRA-TABS) 100 MG tablet Take 1 tablet by mouth two times daily for 7 days 01/31/21     EPINEPHrine 0.3 mg/0.3 mL IJ SOAJ injection Inject 0.3 mg into the muscle as needed for anaphylaxis. 07/09/18   [provider]  famotidine (PEPCID) 20 MG tablet TAKE 1 TABLET BY MOUTH TWO TIMES DAILY AS NEEDED FOR HEARTBURN 03/07/20 03/07/21  Kirk Ruths, MD  famotidine (PEPCID) 20 MG tablet TAKE 1 TABLET BY MOUTH TWO TIMES DAILY AS NEEDED FOR HEARTBURN 05/17/21     ibuprofen (ADVIL) 200 MG tablet Take 400 mg by mouth every 6 (six) hours as needed for headache.    [provider]  meloxicam (MOBIC) 15 MG tablet Take 1 tablet (15 mg total) by mouth once daily 02/06/21     pantoprazole (PROTONIX) 40 MG tablet TAKE 1 TABLET BY MOUTH ONCE DAILY 03/07/20 05/18/21  Kirk Ruths, MD  pantoprazole (PROTONIX) 40 MG tablet TAKE 1 TABLET (40 MG TOTAL) BY MOUTH ONCE DAILY 03/16/19 03/15/20  Kirk Ruths, MD  pantoprazole (PROTONIX) 40 MG tablet TAKE 1 TABLET (40 MG TOTAL) BY MOUTH ONCE DAILY 03/16/21     predniSONE (DELTASONE) 20 MG tablet Prednisone 20 mg; 3 a day for 3 days then 2 a day for 3 days then 1 a day for 3 days 02/06/21  SUMAtriptan (IMITREX) 100 MG tablet Take 1 tablet (100 mg total) by mouth as directed for Migraine. May take a second dose after 2 hours if needed. 02/28/21     tadalafil (CIALIS) 5 MG tablet TAKE 1 TABLET BY MOUTH ONCE DAILY 05/17/21       Family History Family History  Problem Relation Age of Onset   Thyroid disease Mother     Social History Social History   Tobacco Use   Smoking status: Light Smoker    Types: Cigarettes   Smokeless tobacco: Never   Tobacco comments:    smokes socially  Vaping Use   Vaping Use: Never used  Substance Use Topics    Alcohol use: Yes   Drug use: No     Allergies   Alpha-gal   Review of Systems Review of Systems  Constitutional:  Negative for fatigue and fever.  HENT:  Positive for sore throat and voice change. Negative for congestion, rhinorrhea, sinus pressure and sinus pain.   Respiratory:  Negative for cough and shortness of breath.   Gastrointestinal:  Negative for abdominal pain, diarrhea, nausea and vomiting.  Musculoskeletal:  Positive for myalgias.  Neurological:  Negative for weakness, light-headedness and headaches.  Hematological:  Positive for adenopathy.    Physical Exam Triage Vital Signs ED Triage Vitals  Enc Vitals Group     BP      Pulse      Resp      Temp      Temp src      SpO2      Weight      Height      Head Circumference      Peak Flow      Pain Score      Pain Loc      Pain Edu?      Excl. in [place]?    No data found.  Updated Vital Signs BP (S) (!) 145/91 (BP Location: Left Arm)   Pulse 88   Temp 98.5 F (36.9 C) (Oral)   Resp 16   SpO2 99%      Physical Exam Vitals and nursing note reviewed.  Constitutional:      General: He is not in acute distress.    Appearance: Normal appearance. He is well-developed. He is not ill-appearing.  HENT:     Head: Normocephalic and atraumatic.     Nose: Nose normal.     Mouth/Throat:     Mouth: Mucous membranes are moist.     Pharynx: Oropharynx is clear. Posterior oropharyngeal erythema present.     Tonsils: 1+ on the right. 1+ on the left.  Eyes:     General: No scleral icterus.    Conjunctiva/sclera: Conjunctivae normal.  Cardiovascular:     Rate and Rhythm: Normal rate and regular rhythm.  Pulmonary:     Effort: Pulmonary effort is normal. No respiratory distress.     Breath sounds: Normal breath sounds.  Musculoskeletal:     Cervical back: Neck supple.  Lymphadenopathy:     Cervical: Cervical adenopathy present.  Skin:    General: Skin is warm and dry.     Capillary Refill: Capillary refill  takes less than 2 seconds.  Neurological:     General: No focal deficit present.     Mental Status: He is alert. Mental status is at baseline.     Motor: No weakness.     Gait: Gait normal.  Psychiatric:        Mood  and Affect: Mood normal.        Behavior: Behavior normal.        Thought Content: Thought content normal.     UC Treatments / Results  Labs (all labs ordered are listed, but only abnormal results are displayed) Labs Reviewed  GROUP A STREP BY PCR    EKG   Radiology No results found.  Procedures Procedures (including critical care time)  Medications Ordered in UC Medications - No data to display  Initial Impression / Assessment and Plan / UC Course  I have reviewed the triage vital signs and the nursing notes.  Pertinent labs & imaging results that were available during my care of the patient were reviewed by me and considered in my medical decision making (see chart for details).  36 year old male presenting for onset of swollen lymph nodes of anterior neck and sore throat that started yesterday.  Patient says the swelling has gone down from yesterday.  He denies any associated fever, cough, congestion.  Has not had any ear pain but has felt some discomfort behind his ears.  No breathing difficulty, vomiting or diarrhea.  He is afebrile.  He is overall well-appearing.  On exam he does have anterior cervical lymphadenopathy with tenderness palpation and erythema posterior pharynx with 1+ bilateral enlarged tonsils.  PCR strep test obtained.  Negative.  Discussed result with patient.  Suspect viral pharyngitis and reactive lymphadenopathy.  Encouraged him to increase rest and fluids and try over-the-counter ibuprofen and or Tylenol for aches and discomfort.  Reviewed return and ER precautions and especially Wandra Feinstein return for any fevers or new or worsening symptoms.  Offered work note but he declines.   Final Clinical Impressions(s) / UC Diagnoses   Final  diagnoses:  Acute pharyngitis, unspecified etiology  Lymphadenopathy of head and neck     Discharge Instructions      -Your strep test was negative. - Your symptoms may be due to a viral illness, in which case she should be feeling better over the next week.  Take ibuprofen or Tylenol as needed for body aches and increase rest and fluid intake. - If you develop other symptoms including fever, fatigue, cough, congestion, headaches, chest pain, breathing trouble, abdominal pain, vomiting or diarrhea or any new or worsening symptoms you should be reexamined in please feel free to return to our department.  For any severe acute worsening of your symptoms, please go to the ER.     ED Prescriptions   None    PDMP not reviewed this encounter.   Danton Clap, PA-C 06/09/21 0932    Danton Clap, PA-C 06/09/21 [phone]

## 2021-06-09 NOTE — Discharge Instructions (Signed)
-  Your strep test was negative. - Your symptoms may be due to a viral illness, in which case she should be feeling better over the next week.  Take ibuprofen or Tylenol as needed for body aches and increase rest and fluid intake. - If you develop other symptoms including fever, fatigue, cough, congestion, headaches, chest pain, breathing trouble, abdominal pain, vomiting or diarrhea or any new or worsening symptoms you should be reexamined in please feel free to return to our department.  For any severe acute worsening of your symptoms, please go to the ER.

## 2021-06-09 NOTE — ED Triage Notes (Signed)
Patient presents to Urgent Care with complaints of sore throat and body aches since yesterday, worse today. Not taking any medications for symptom relief.   Denies fever.

## 2021-06-20 ENCOUNTER — Other Ambulatory Visit: Payer: Self-pay

## 2021-06-21 ENCOUNTER — Other Ambulatory Visit: Payer: Self-pay

## 2021-06-21 MED ORDER — FAMOTIDINE 20 MG PO TABS
ORAL_TABLET | ORAL | 5 refills | Status: DC
  Filled 2021-06-21: qty 60, 30d supply, fill #0
  Filled 2021-07-19: qty 60, 30d supply, fill #1
  Filled 2021-08-22: qty 60, 30d supply, fill #2
  Filled 2021-09-26: qty 60, 30d supply, fill #3
  Filled 2021-10-29: qty 60, 30d supply, fill #4
  Filled 2022-01-04: qty 60, 30d supply, fill #5

## 2021-06-21 MED ORDER — TADALAFIL 5 MG PO TABS
5.0000 mg | ORAL_TABLET | Freq: Every day | ORAL | 5 refills | Status: DC
  Filled 2021-06-21: qty 30, 30d supply, fill #0
  Filled 2021-07-19: qty 30, 30d supply, fill #1
  Filled 2021-10-29: qty 30, 30d supply, fill #2

## 2021-06-21 MED ORDER — AMPHETAMINE-DEXTROAMPHETAMINE 20 MG PO TABS
ORAL_TABLET | ORAL | 0 refills | Status: DC
  Filled 2021-06-21: qty 60, 30d supply, fill #0

## 2021-07-19 ENCOUNTER — Other Ambulatory Visit: Payer: Self-pay

## 2021-07-19 ENCOUNTER — Encounter: Payer: Self-pay | Admitting: *Deleted

## 2021-07-19 MED ORDER — PANTOPRAZOLE SODIUM 40 MG PO TBEC
DELAYED_RELEASE_TABLET | ORAL | 1 refills | Status: DC
  Filled 2021-07-19: qty 90, 90d supply, fill #0
  Filled 2021-09-26: qty 90, 90d supply, fill #1

## 2021-07-19 MED ORDER — AMPHETAMINE-DEXTROAMPHETAMINE 20 MG PO TABS
ORAL_TABLET | ORAL | 0 refills | Status: DC
  Filled 2021-07-19: qty 60, 30d supply, fill #0

## 2021-07-20 ENCOUNTER — Other Ambulatory Visit: Payer: Self-pay

## 2021-07-21 ENCOUNTER — Other Ambulatory Visit: Payer: Self-pay

## 2021-08-22 ENCOUNTER — Other Ambulatory Visit: Payer: Self-pay

## 2021-08-22 MED ORDER — AMPHETAMINE-DEXTROAMPHETAMINE 20 MG PO TABS
ORAL_TABLET | ORAL | 0 refills | Status: DC
  Filled 2021-08-22: qty 60, 30d supply, fill #0

## 2021-09-06 ENCOUNTER — Other Ambulatory Visit: Payer: Self-pay

## 2021-09-07 ENCOUNTER — Other Ambulatory Visit: Payer: Self-pay

## 2021-09-07 MED ORDER — EUCRISA 2 % EX OINT
TOPICAL_OINTMENT | CUTANEOUS | 1 refills | Status: AC
  Filled 2021-09-07: qty 60, 20d supply, fill #0
  Filled 2021-10-29: qty 60, 20d supply, fill #1

## 2021-09-07 MED ORDER — TRIAMCINOLONE ACETONIDE 0.1 % EX CREA
TOPICAL_CREAM | CUTANEOUS | 0 refills | Status: DC
  Filled 2021-09-07: qty 454, 20d supply, fill #0

## 2021-09-11 ENCOUNTER — Other Ambulatory Visit: Payer: Self-pay

## 2021-09-11 MED ORDER — ERYTHROMYCIN 5 MG/GM OP OINT
TOPICAL_OINTMENT | OPHTHALMIC | 0 refills | Status: DC
  Filled 2021-09-11: qty 3.5, 7d supply, fill #0

## 2021-09-14 ENCOUNTER — Other Ambulatory Visit: Payer: Self-pay

## 2021-09-14 DIAGNOSIS — F320 Major depressive disorder, single episode, mild: Secondary | ICD-10-CM | POA: Insufficient documentation

## 2021-09-14 DIAGNOSIS — Z862 Personal history of diseases of the blood and blood-forming organs and certain disorders involving the immune mechanism: Secondary | ICD-10-CM | POA: Insufficient documentation

## 2021-09-14 MED ORDER — NEOMYCIN-POLYMYXIN-DEXAMETH 0.1 % OP SUSP
OPHTHALMIC | 0 refills | Status: DC
  Filled 2021-09-14: qty 5, 7d supply, fill #0

## 2021-09-14 MED ORDER — ESCITALOPRAM OXALATE 10 MG PO TABS
10.0000 mg | ORAL_TABLET | Freq: Every day | ORAL | 2 refills | Status: DC
  Filled 2021-09-14: qty 30, 30d supply, fill #0
  Filled 2021-10-29: qty 30, 30d supply, fill #1

## 2021-09-25 ENCOUNTER — Other Ambulatory Visit: Payer: Self-pay

## 2021-09-25 MED ORDER — DULOXETINE HCL 30 MG PO CPEP
ORAL_CAPSULE | ORAL | 11 refills | Status: DC
  Filled 2021-09-25: qty 30, 30d supply, fill #0
  Filled 2021-10-29 – 2022-05-24 (×3): qty 30, 30d supply, fill #1

## 2021-09-26 ENCOUNTER — Other Ambulatory Visit: Payer: Self-pay

## 2021-09-27 ENCOUNTER — Other Ambulatory Visit: Payer: Self-pay

## 2021-09-27 MED ORDER — AMPHETAMINE-DEXTROAMPHETAMINE 20 MG PO TABS
20.0000 mg | ORAL_TABLET | Freq: Two times a day (BID) | ORAL | 0 refills | Status: DC
  Filled 2021-09-27: qty 60, 30d supply, fill #0

## 2021-10-29 ENCOUNTER — Other Ambulatory Visit: Payer: Self-pay

## 2021-10-30 ENCOUNTER — Other Ambulatory Visit: Payer: Self-pay

## 2021-10-30 MED ORDER — AMPHETAMINE-DEXTROAMPHETAMINE 20 MG PO TABS
20.0000 mg | ORAL_TABLET | Freq: Two times a day (BID) | ORAL | 0 refills | Status: DC
  Filled 2021-10-30: qty 60, 30d supply, fill #0

## 2021-10-31 ENCOUNTER — Other Ambulatory Visit: Payer: Self-pay

## 2021-10-31 MED ORDER — AMPHETAMINE-DEXTROAMPHETAMINE 20 MG PO TABS
20.0000 mg | ORAL_TABLET | Freq: Two times a day (BID) | ORAL | 0 refills | Status: DC
  Filled 2021-12-05: qty 60, 30d supply, fill #0

## 2021-11-05 ENCOUNTER — Other Ambulatory Visit: Payer: Self-pay

## 2021-11-14 ENCOUNTER — Other Ambulatory Visit: Payer: Self-pay

## 2021-11-16 ENCOUNTER — Other Ambulatory Visit: Payer: Self-pay

## 2021-11-17 ENCOUNTER — Other Ambulatory Visit: Payer: Self-pay

## 2021-11-17 ENCOUNTER — Ambulatory Visit (INDEPENDENT_AMBULATORY_CARE_PROVIDER_SITE_OTHER): Payer: No Typology Code available for payment source | Admitting: Psychiatry

## 2021-11-17 ENCOUNTER — Encounter: Payer: Self-pay | Admitting: Psychiatry

## 2021-11-17 VITALS — BP 131/78 | HR 76 | Temp 98.5°F | Ht 70.0 in | Wt 219.6 lb

## 2021-11-17 DIAGNOSIS — R41840 Attention and concentration deficit: Secondary | ICD-10-CM | POA: Insufficient documentation

## 2021-11-17 DIAGNOSIS — F330 Major depressive disorder, recurrent, mild: Secondary | ICD-10-CM

## 2021-11-17 DIAGNOSIS — F1290 Cannabis use, unspecified, uncomplicated: Secondary | ICD-10-CM | POA: Insufficient documentation

## 2021-11-17 DIAGNOSIS — F4322 Adjustment disorder with anxiety: Secondary | ICD-10-CM | POA: Diagnosis not present

## 2021-11-17 MED ORDER — MIRTAZAPINE 7.5 MG PO TABS
7.5000 mg | ORAL_TABLET | Freq: Every day | ORAL | 1 refills | Status: DC
  Filled 2021-11-17: qty 30, 30d supply, fill #0

## 2021-11-17 MED ORDER — ESCITALOPRAM OXALATE 10 MG PO TABS: 5.0000 mg | ORAL_TABLET | Freq: Every day | ORAL | 0 refills | Status: DC

## 2021-11-17 NOTE — Patient Instructions (Signed)
[email or web address]  [email or web address]  [email or web address] [address] [place], [place] [address]   [phone]  [facility], [email or web address] [address] [place],  [address]  [phone]   [facility] - [phone]  [facility] - [phone]  [facility] [place] [phone]  [facility] [phone]     Serotonin Syndrome Serotonin is a chemical that helps to control several functions in the body. This chemical is also called a neurotransmitter. It controls: Brain and nerve cell function. Mood and emotions. Memory. Eating. Sleeping. Sexual activity. Stress response. Having too much serotonin in your body can cause serotonin syndrome. This condition can be harmful to your brain and nerve cells. This can be a life-threatening condition. What are the causes? This condition may be caused by taking medicines or drugs that increase the level of serotonin in your body, such as: Antidepressant medicines. Migraine medicines. Certain pain medicines. Certain drugs, including ecstasy, LSD, cocaine, and amphetamines. Over-the-counter cough or cold medicines that contain dextromethorphan. Certain herbal supplements, including St. John's wort, ginseng, and nutmeg. This condition usually occurs when you take these medicines or drugs together, but it can also happen with a high dose of a single medicine or drug. What increases the risk? You are more likely to develop this condition if: You just started taking a medicine or drug that increases the level of serotonin in the body. You recently increased the dose of a medicine or drug that increases the level of serotonin in the body. You take more than one medicine or drug that increases the level of serotonin in the body. What are the signs or symptoms? Symptoms of this  condition usually start within several hours of taking a medicine or drug. Symptoms may be mild or severe. Mild symptoms include: Sweating. Restlessness or agitation. Muscle twitching or stiffness. Rapid heart rate. Nausea, vomiting, or diarrhea. Shivering or goose bumps. Confusion. Severe symptoms include: Irregular heartbeat. Seizures. Loss of consciousness. High fever. How is this diagnosed? This condition may be diagnosed based on: Your medical history. A physical exam. Your prior use of drugs and medicines. Blood or urine tests. These may be used to rule out other causes of your symptoms. How is this treated? The treatment for this condition depends on the severity of your symptoms. For mild cases, stopping the medicine or drug that caused your condition is usually all that is needed. For moderate to severe cases, treatment in a hospital may be needed to prevent or treat life-threatening symptoms. Treatment may include: Medicines to control your symptoms. IV fluids. Actions to support your breathing. Treatments to control your body temperature. Follow these instructions at home: Medicines  Take over-the-counter and prescription medicines only as told by your health care provider. Check with your health care provider before you start taking any new prescriptions, over-the-counter medicines, herbs, or supplements. Do not combine any medicines that can cause this condition. Lifestyle  Maintain a healthy lifestyle. Eat a healthy diet that includes plenty of vegetables, fruits, whole grains, low-fat dairy products, and lean protein. Do not eat a lot of foods that are high in fat, added sugars, or salt. Get the right amount and quality of sleep. Most adults need 7-9 hours of sleep each night. Make time to exercise, even if it is only for short periods of time. Most adults should exercise for at least 150 minutes each week. Do  not drink alcohol. Do not use illegal drugs. Do not  take medicines for reasons other than they are prescribed. General instructions Do not use any products that contain nicotine or tobacco. These products include cigarettes, chewing tobacco, and vaping devices, such as e-cigarettes. If you need help quitting, ask your health care provider. Contact a health care provider if: Your symptoms do not improve or they get worse. Get help right away if: You have worsening confusion, severe headache, chest pain, high fever, seizures, or loss of consciousness. You experience serious side effects of medicine, such as swelling of your face, lips, tongue, or throat. These symptoms may be an emergency. Get help right away. Call 911. Do not wait to see if the symptoms will go away. Do not drive yourself to the hospital. Also, get help right away if: You have serious thoughts about hurting yourself or others. Take one of these steps if you feel like you may hurt yourself or others, or have thoughts about taking your own life: Go to your nearest emergency room. Call 911. Call the [facility] at [phone] or [phone]. This is open 24 hours a day. Text the Crisis Text Line at (305) 508-4421. Summary Serotonin is a chemical that helps to control several functions in the body. High levels of serotonin in the body can cause serotonin syndrome, which can be life-threatening. This condition may be caused by taking medicines or drugs that increase the level of serotonin in your body. Treatment depends on the severity of your symptoms. For mild cases, stopping the medicine or drug that caused your condition is usually all that is needed. Check with your health care provider before you start taking any new prescriptions, over-the-counter medicines, herbs, or supplements. This information is not intended to replace advice given to you by your health care provider. Make sure you discuss any questions you have with your health care provider. Document  Revised: 03/23/2021 Document Reviewed: 03/23/2021 Elsevier Patient Education  [place]. Mirtazapine Tablets What is this medication? MIRTAZAPINE (mir TAZ a peen) treats depression. It increases the amount of serotonin and norepinephrine in the brain, hormones that help regulate mood. This medicine may be used for other purposes; ask your health care provider or pharmacist if you have questions. COMMON BRAND NAME(S): Remeron What should I tell my care team before I take this medication? They need to know if you have any of these conditions: Bipolar disorder Glaucoma Kidney disease Liver disease Suicidal thoughts An unusual or allergic reaction to mirtazapine, other medications, foods, dyes, or preservatives Pregnant or trying to get pregnant Breast-feeding How should I use this medication? Take this medication by mouth with a glass of water. Follow the directions on the prescription label. Take your medication at regular intervals. Do not take your medication more often than directed. Do not stop taking this medication suddenly except upon the advice of your care team. Stopping this medication too quickly may cause serious side effects or your condition may worsen. A special MedGuide will be given to you by the pharmacist with each prescription and refill. Be sure to read this information carefully each time. Talk to your care team about the use of this medication in children. Special care may be needed. Overdosage: If you think you have taken too much of this medicine contact a poison control center or emergency room at once. NOTE: This medicine is only for you. Do not share this medicine with others. What if I miss a dose? If you  miss a dose, take it as soon as you can. If it is almost time for your next dose, take only that dose. Do not take double or extra doses. What may interact with this medication? Do not take this medication with any of the following: Linezolid MAOIs,  such as Carbex, Eldepryl, Marplan, Nardil, and Parnate Methylene blue (injected into a vein) This medication may also interact with the following: Alcohol Antivirals for HIV or AIDS Certain medications that treat or prevent blood clots, such as warfarin Certain medications for fungal infections, such as ketoconazole and itraconazole Certain medications for mental health conditions Certain medications for migraine headache, such as almotriptan, eletriptan, frovatriptan, naratriptan, rizatriptan, sumatriptan, zolmitriptan Certain medications for seizures, such as carbamazepine or phenytoin Certain medications for sleep Cimetidine Erythromycin Fentanyl Lithium Medications for blood pressure Nefazodone Rasagiline Rifampin Supplements, such as St. John's wort, kava kava, valerian Tramadol Tryptophan This list may not describe all possible interactions. Give your health care provider a list of all the medicines, herbs, non-prescription drugs, or dietary supplements you use. Also tell them if you smoke, drink alcohol, or use illegal drugs. Some items may interact with your medicine. What should I watch for while using this medication? Visit your care team for regular checks on your progress. It may be some time before you see the benefit from this medication. This medication may cause thoughts of suicide or depression. This includes sudden changes in mood, behaviors, or thoughts. These changes can happen at any time but are more common in the beginning of treatment or after a change in dose. Call your care team right away if you experience these thoughts or worsening depression. This medication may affect your coordination, reaction time, or judgment. Do not drive or operate machinery until you know how this medication affects you. Sit up or stand slowly to reduce the risk of dizzy or fainting spells. Drinking alcohol with this medication can increase the risk of these side effects. This  medication may cause dry eyes and blurred vision. If you wear contact lenses, you may feel some discomfort. Lubricating eye drops may help. See your care team if the problem does not go away or is severe. Your mouth may get dry. Chewing sugarless gum or sucking hard candy and drinking plenty of water may help. Contact your care team if the problem does not go away or is severe. What side effects may I notice from receiving this medication? Side effects that you should report to your care team as soon as possible: Allergic reactions--skin rash, itching, hives, swelling of the face, lips, tongue, or throat Heart rhythm changes--fast or irregular heartbeat, dizziness, feeling faint or lightheaded, chest pain, trouble breathing Infection--fever, chills, cough, or sore throat Irritability, confusion, fast or irregular heartbeat, muscle stiffness, twitching muscles, sweating, high fever, seizure, chills, vomiting, diarrhea, which may be signs of serotonin syndrome Low sodium level--muscle weakness, fatigue, dizziness, headache, confusion Rash, fever, and swollen lymph nodes Redness, blistering, peeling or loosening of the skin, including inside the mouth Seizures Sudden eye pain or change in vision such as blurry vision, seeing halos around lights, vision loss Thoughts of suicide or self-harm, worsening mood, feelings of depression Side effects that usually do not require medical attention (report to your care team if they continue or are bothersome): Constipation Dizziness Drowsiness Dry mouth Increase in appetite Weight gain This list may not describe all possible side effects. Call your doctor for medical advice about side effects. You may report side effects to FDA at  1-800-FDA-1088. Where should I keep my medication? Keep out of the reach of children. Store at room temperature between 15 and 30 degrees C (59 and 86 degrees F) Protect from light and moisture. Throw away any unused medication  after the expiration date. NOTE: This sheet is a summary. It may not cover all possible information. If you have questions about this medicine, talk to your doctor, pharmacist, or health care provider.  2023 Elsevier/Gold Standard (2007-02-22 00:00:00)
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
## 2021-12-01 ENCOUNTER — Other Ambulatory Visit: Payer: Self-pay

## 2021-12-05 ENCOUNTER — Other Ambulatory Visit: Payer: Self-pay

## 2021-12-05 MED ORDER — PANTOPRAZOLE SODIUM 40 MG PO TBEC
DELAYED_RELEASE_TABLET | ORAL | 1 refills | Status: DC
  Filled 2021-12-05: qty 90, 90d supply, fill #0

## 2022-01-04 ENCOUNTER — Other Ambulatory Visit: Payer: Self-pay

## 2022-01-04 MED ORDER — AMPHETAMINE-DEXTROAMPHETAMINE 20 MG PO TABS
20.0000 mg | ORAL_TABLET | Freq: Two times a day (BID) | ORAL | 0 refills | Status: DC
  Filled 2022-01-04: qty 60, 30d supply, fill #0

## 2022-01-24 ENCOUNTER — Ambulatory Visit: Payer: No Typology Code available for payment source | Admitting: Psychiatry

## 2022-02-08 ENCOUNTER — Other Ambulatory Visit: Payer: Self-pay

## 2022-02-08 MED ORDER — FAMOTIDINE 20 MG PO TABS
20.0000 mg | ORAL_TABLET | Freq: Two times a day (BID) | ORAL | 5 refills | Status: DC | PRN
  Filled 2022-02-08: qty 60, 30d supply, fill #0
  Filled 2022-03-15: qty 60, 30d supply, fill #1
  Filled 2022-05-24 (×2): qty 60, 30d supply, fill #2
  Filled 2022-07-05: qty 60, 30d supply, fill #3
  Filled 2022-08-03: qty 60, 30d supply, fill #4
  Filled 2022-10-01: qty 60, 30d supply, fill #5

## 2022-02-08 MED ORDER — AMPHETAMINE-DEXTROAMPHETAMINE 20 MG PO TABS
20.0000 mg | ORAL_TABLET | Freq: Two times a day (BID) | ORAL | 0 refills | Status: DC
  Filled 2022-02-08: qty 60, 30d supply, fill #0

## 2022-03-15 ENCOUNTER — Other Ambulatory Visit: Payer: Self-pay

## 2022-03-16 ENCOUNTER — Other Ambulatory Visit: Payer: Self-pay

## 2022-03-16 MED ORDER — AMPHETAMINE-DEXTROAMPHETAMINE 20 MG PO TABS
20.0000 mg | ORAL_TABLET | Freq: Two times a day (BID) | ORAL | 0 refills | Status: DC
  Filled 2022-03-16: qty 60, 30d supply, fill #0

## 2022-03-23 IMAGING — CR DG KNEE COMPLETE 4+V*R*
5 series · 5 of 5 positions shown · non-contrast
Comparison: None.

CLINICAL DATA: Injured playing kickball yesterday.  Medial pain.

EXAM:
RIGHT KNEE - COMPLETE 4+ VIEW

[knee ap]
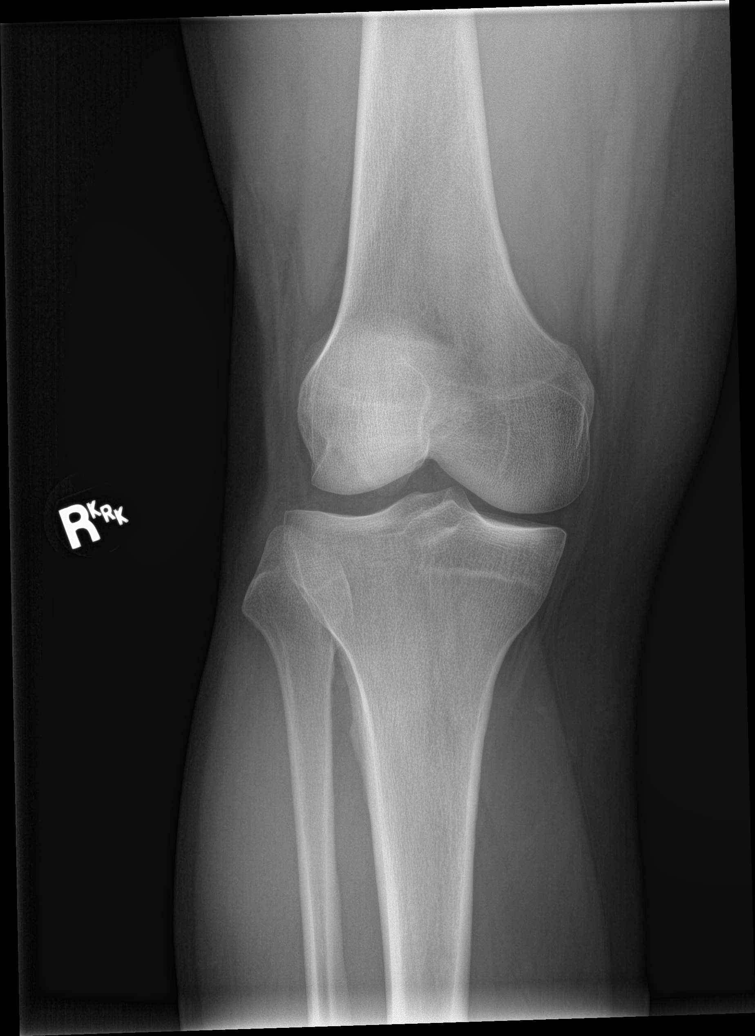

[knee lat]
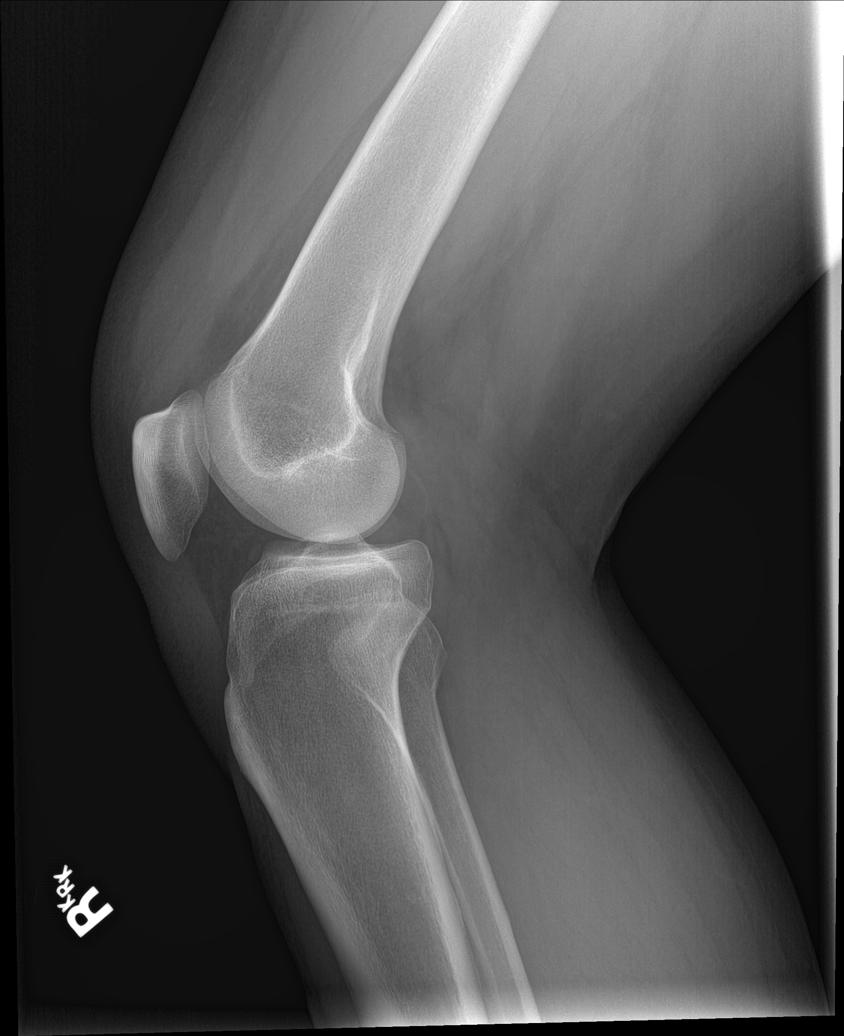

[tunnel (1 of 2)]
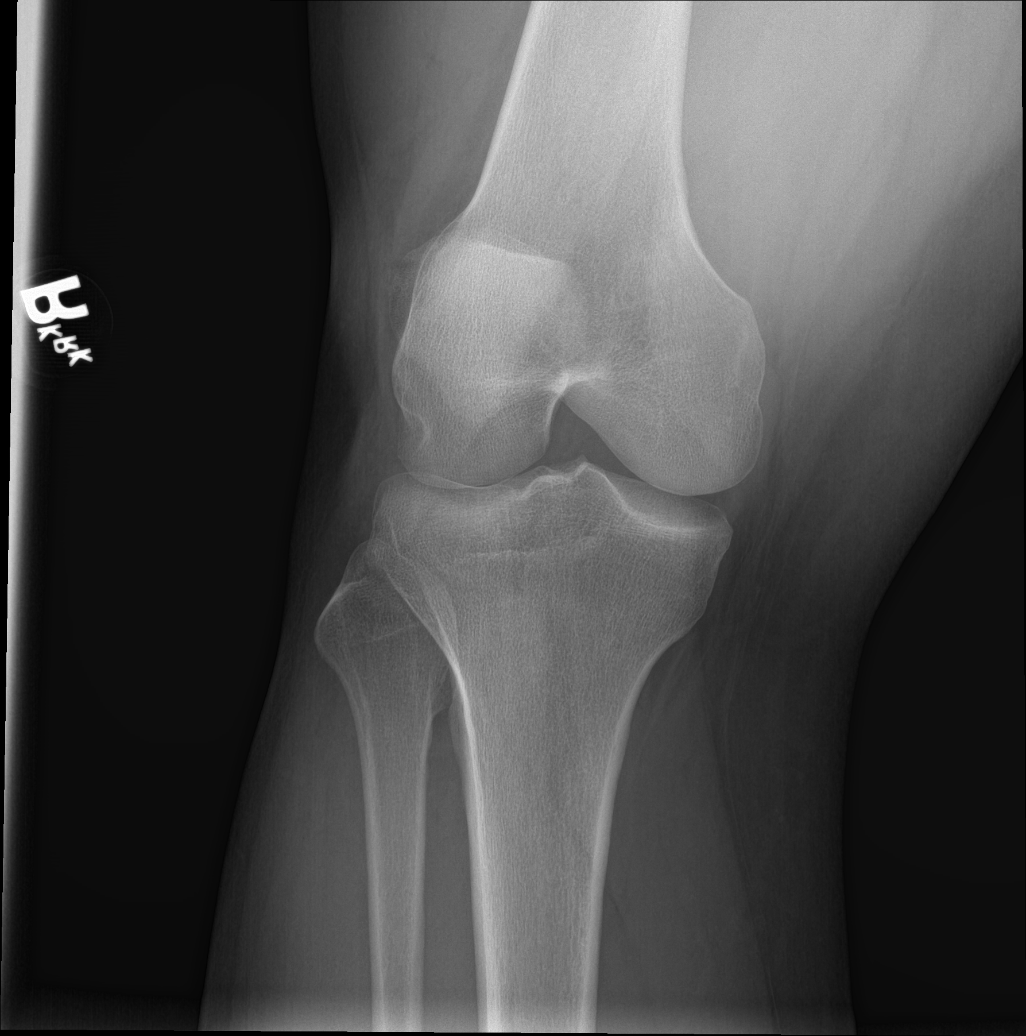

[patella skyline]
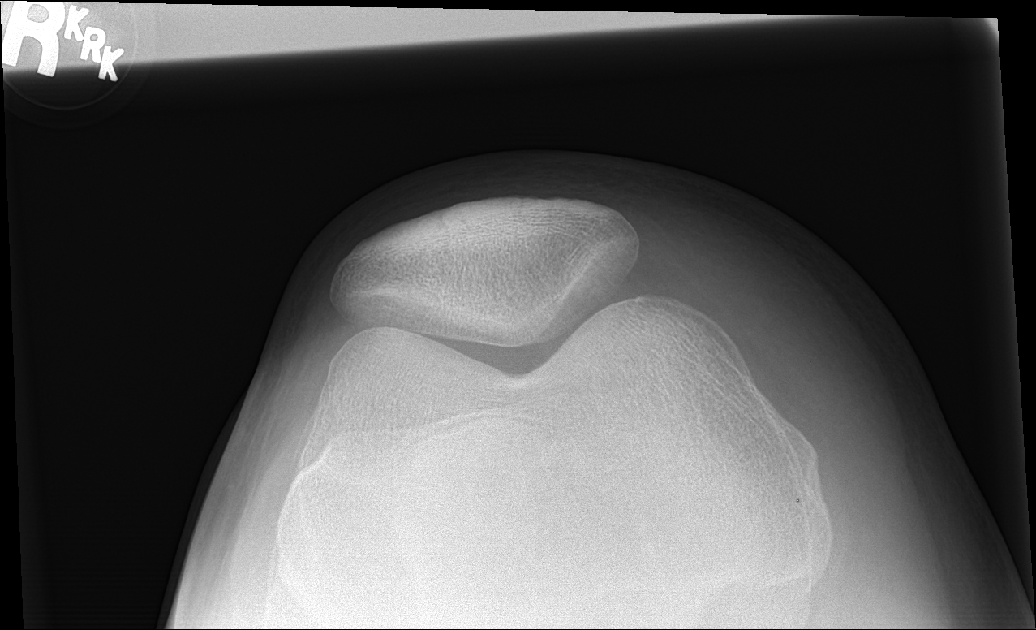

[tunnel (2 of 2)]
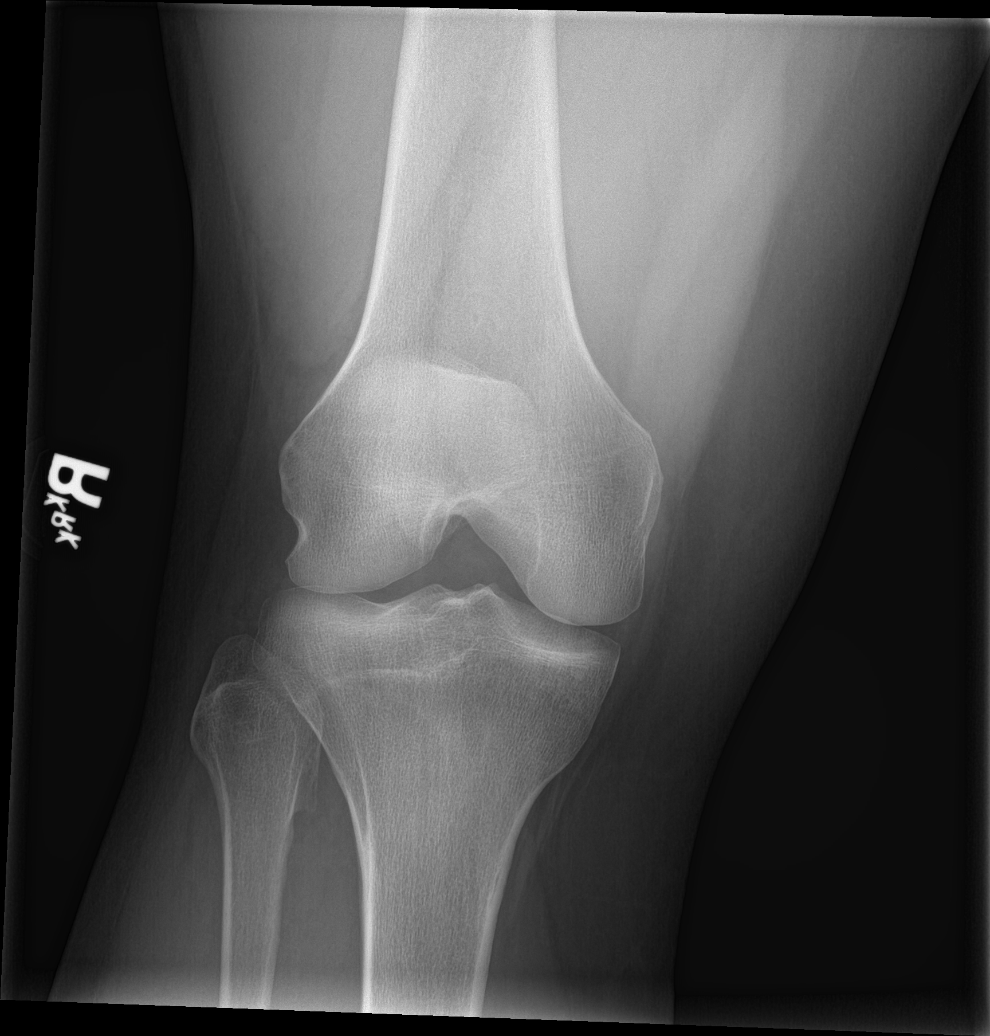

[5 of 5 positions shown; findings below may reference images not displayed]

FINDINGS: No evidence of fracture, dislocation, or joint effusion. No evidence
of arthropathy or other focal bone abnormality. Soft tissues are
unremarkable.
IMPRESSION: Negative.

## 2022-04-04 ENCOUNTER — Other Ambulatory Visit: Payer: Self-pay

## 2022-04-04 DIAGNOSIS — F988 Other specified behavioral and emotional disorders with onset usually occurring in childhood and adolescence: Secondary | ICD-10-CM | POA: Diagnosis not present

## 2022-04-04 DIAGNOSIS — Z0000 Encounter for general adult medical examination without abnormal findings: Secondary | ICD-10-CM | POA: Diagnosis not present

## 2022-04-04 DIAGNOSIS — E8809 Other disorders of plasma-protein metabolism, not elsewhere classified: Secondary | ICD-10-CM | POA: Diagnosis not present

## 2022-04-04 DIAGNOSIS — R7303 Prediabetes: Secondary | ICD-10-CM | POA: Diagnosis not present

## 2022-04-04 MED ORDER — PANTOPRAZOLE SODIUM 40 MG PO TBEC
40.0000 mg | DELAYED_RELEASE_TABLET | Freq: Every day | ORAL | 1 refills | Status: DC
  Filled 2022-04-04 – 2022-04-18 (×2): qty 90, 90d supply, fill #0
  Filled 2022-05-24: qty 90, 90d supply, fill #1

## 2022-04-04 MED ORDER — EPINEPHRINE 0.3 MG/0.3ML IJ SOAJ
0.3000 mg | Freq: Once | INTRAMUSCULAR | 0 refills | Status: AC
  Filled 2022-04-04: qty 2, 30d supply, fill #0
  Filled 2022-04-19: qty 2, 2d supply, fill #0

## 2022-04-13 ENCOUNTER — Other Ambulatory Visit: Payer: Self-pay

## 2022-04-18 ENCOUNTER — Other Ambulatory Visit: Payer: Self-pay

## 2022-04-19 ENCOUNTER — Other Ambulatory Visit: Payer: Self-pay

## 2022-04-19 MED ORDER — AMPHETAMINE-DEXTROAMPHETAMINE 20 MG PO TABS
20.0000 mg | ORAL_TABLET | Freq: Two times a day (BID) | ORAL | 0 refills | Status: DC
  Filled 2022-04-19: qty 60, 30d supply, fill #0

## 2022-04-23 ENCOUNTER — Other Ambulatory Visit: Payer: Self-pay

## 2022-04-23 MED ORDER — METHYLPREDNISOLONE 4 MG PO TBPK
ORAL_TABLET | ORAL | 0 refills | Status: DC
  Filled 2022-04-23: qty 21, 6d supply, fill #0

## 2022-05-05 DIAGNOSIS — R571 Hypovolemic shock: Secondary | ICD-10-CM | POA: Diagnosis not present

## 2022-05-05 DIAGNOSIS — S36592A Other injury of descending [left] colon, initial encounter: Secondary | ICD-10-CM | POA: Diagnosis not present

## 2022-05-05 DIAGNOSIS — T797XXA Traumatic subcutaneous emphysema, initial encounter: Secondary | ICD-10-CM | POA: Diagnosis not present

## 2022-05-05 DIAGNOSIS — E871 Hypo-osmolality and hyponatremia: Secondary | ICD-10-CM | POA: Diagnosis not present

## 2022-05-05 DIAGNOSIS — S61401A Unspecified open wound of right hand, initial encounter: Secondary | ICD-10-CM | POA: Diagnosis not present

## 2022-05-05 DIAGNOSIS — K922 Gastrointestinal hemorrhage, unspecified: Secondary | ICD-10-CM | POA: Diagnosis not present

## 2022-05-05 DIAGNOSIS — R29898 Other symptoms and signs involving the musculoskeletal system: Secondary | ICD-10-CM | POA: Diagnosis not present

## 2022-05-05 DIAGNOSIS — R001 Bradycardia, unspecified: Secondary | ICD-10-CM | POA: Diagnosis not present

## 2022-05-05 DIAGNOSIS — S31109A Unspecified open wound of abdominal wall, unspecified quadrant without penetration into peritoneal cavity, initial encounter: Secondary | ICD-10-CM | POA: Diagnosis not present

## 2022-05-05 DIAGNOSIS — S31139A Puncture wound of abdominal wall without foreign body, unspecified quadrant without penetration into peritoneal cavity, initial encounter: Secondary | ICD-10-CM | POA: Diagnosis not present

## 2022-05-05 DIAGNOSIS — K219 Gastro-esophageal reflux disease without esophagitis: Secondary | ICD-10-CM | POA: Diagnosis not present

## 2022-05-05 DIAGNOSIS — E872 Acidosis, unspecified: Secondary | ICD-10-CM | POA: Diagnosis not present

## 2022-05-05 DIAGNOSIS — D72829 Elevated white blood cell count, unspecified: Secondary | ICD-10-CM | POA: Diagnosis not present

## 2022-05-05 DIAGNOSIS — G8911 Acute pain due to trauma: Secondary | ICD-10-CM | POA: Diagnosis not present

## 2022-05-05 DIAGNOSIS — I2699 Other pulmonary embolism without acute cor pulmonale: Secondary | ICD-10-CM | POA: Diagnosis not present

## 2022-05-05 DIAGNOSIS — Z4682 Encounter for fitting and adjustment of non-vascular catheter: Secondary | ICD-10-CM | POA: Diagnosis not present

## 2022-05-05 DIAGNOSIS — R339 Retention of urine, unspecified: Secondary | ICD-10-CM | POA: Diagnosis not present

## 2022-05-05 DIAGNOSIS — R739 Hyperglycemia, unspecified: Secondary | ICD-10-CM | POA: Diagnosis not present

## 2022-05-05 DIAGNOSIS — R609 Edema, unspecified: Secondary | ICD-10-CM | POA: Diagnosis not present

## 2022-05-05 DIAGNOSIS — Z4659 Encounter for fitting and adjustment of other gastrointestinal appliance and device: Secondary | ICD-10-CM | POA: Diagnosis not present

## 2022-05-05 DIAGNOSIS — N50812 Left testicular pain: Secondary | ICD-10-CM | POA: Diagnosis not present

## 2022-05-05 DIAGNOSIS — R1011 Right upper quadrant pain: Secondary | ICD-10-CM | POA: Diagnosis not present

## 2022-05-05 DIAGNOSIS — E861 Hypovolemia: Secondary | ICD-10-CM | POA: Diagnosis not present

## 2022-05-05 DIAGNOSIS — Z0000 Encounter for general adult medical examination without abnormal findings: Secondary | ICD-10-CM | POA: Diagnosis not present

## 2022-05-05 DIAGNOSIS — W3302XA Accidental discharge of hunting rifle, initial encounter: Secondary | ICD-10-CM | POA: Diagnosis not present

## 2022-05-05 DIAGNOSIS — J9691 Respiratory failure, unspecified with hypoxia: Secondary | ICD-10-CM | POA: Diagnosis not present

## 2022-05-05 DIAGNOSIS — J9601 Acute respiratory failure with hypoxia: Secondary | ICD-10-CM | POA: Diagnosis not present

## 2022-05-05 DIAGNOSIS — K6389 Other specified diseases of intestine: Secondary | ICD-10-CM | POA: Diagnosis not present

## 2022-05-05 DIAGNOSIS — F909 Attention-deficit hyperactivity disorder, unspecified type: Secondary | ICD-10-CM | POA: Diagnosis not present

## 2022-05-05 DIAGNOSIS — R0689 Other abnormalities of breathing: Secondary | ICD-10-CM | POA: Diagnosis not present

## 2022-05-05 DIAGNOSIS — S32392B Other fracture of left ilium, initial encounter for open fracture: Secondary | ICD-10-CM | POA: Diagnosis not present

## 2022-05-05 DIAGNOSIS — W3400XA Accidental discharge from unspecified firearms or gun, initial encounter: Secondary | ICD-10-CM | POA: Diagnosis not present

## 2022-05-05 DIAGNOSIS — D62 Acute posthemorrhagic anemia: Secondary | ICD-10-CM | POA: Diagnosis not present

## 2022-05-05 DIAGNOSIS — S36498A Other injury of other part of small intestine, initial encounter: Secondary | ICD-10-CM | POA: Diagnosis not present

## 2022-05-05 DIAGNOSIS — J9600 Acute respiratory failure, unspecified whether with hypoxia or hypercapnia: Secondary | ICD-10-CM | POA: Diagnosis not present

## 2022-05-05 DIAGNOSIS — S31149A Puncture wound of abdominal wall with foreign body, unspecified quadrant without penetration into peritoneal cavity, initial encounter: Secondary | ICD-10-CM | POA: Diagnosis not present

## 2022-05-05 DIAGNOSIS — J9811 Atelectasis: Secondary | ICD-10-CM | POA: Diagnosis not present

## 2022-05-05 DIAGNOSIS — S32302B Unspecified fracture of left ilium, initial encounter for open fracture: Secondary | ICD-10-CM | POA: Diagnosis not present

## 2022-05-05 DIAGNOSIS — I959 Hypotension, unspecified: Secondary | ICD-10-CM | POA: Diagnosis not present

## 2022-05-05 DIAGNOSIS — S36522A Contusion of descending [left] colon, initial encounter: Secondary | ICD-10-CM | POA: Diagnosis not present

## 2022-05-06 DIAGNOSIS — Z4682 Encounter for fitting and adjustment of non-vascular catheter: Secondary | ICD-10-CM | POA: Diagnosis not present

## 2022-05-06 DIAGNOSIS — J9811 Atelectasis: Secondary | ICD-10-CM | POA: Diagnosis not present

## 2022-05-07 ENCOUNTER — Other Ambulatory Visit: Payer: Self-pay

## 2022-05-08 ENCOUNTER — Other Ambulatory Visit: Payer: Self-pay

## 2022-05-10 DIAGNOSIS — W3400XA Accidental discharge from unspecified firearms or gun, initial encounter: Secondary | ICD-10-CM | POA: Diagnosis not present

## 2022-05-10 DIAGNOSIS — Z0000 Encounter for general adult medical examination without abnormal findings: Secondary | ICD-10-CM | POA: Diagnosis not present

## 2022-05-12 DIAGNOSIS — Z0000 Encounter for general adult medical examination without abnormal findings: Secondary | ICD-10-CM | POA: Diagnosis not present

## 2022-05-12 DIAGNOSIS — W3400XA Accidental discharge from unspecified firearms or gun, initial encounter: Secondary | ICD-10-CM | POA: Diagnosis not present

## 2022-05-13 DIAGNOSIS — I2699 Other pulmonary embolism without acute cor pulmonale: Secondary | ICD-10-CM | POA: Diagnosis not present

## 2022-05-13 DIAGNOSIS — S31139A Puncture wound of abdominal wall without foreign body, unspecified quadrant without penetration into peritoneal cavity, initial encounter: Secondary | ICD-10-CM | POA: Diagnosis not present

## 2022-05-13 DIAGNOSIS — K219 Gastro-esophageal reflux disease without esophagitis: Secondary | ICD-10-CM | POA: Diagnosis not present

## 2022-05-13 DIAGNOSIS — K828 Other specified diseases of gallbladder: Secondary | ICD-10-CM | POA: Diagnosis not present

## 2022-05-13 DIAGNOSIS — R29898 Other symptoms and signs involving the musculoskeletal system: Secondary | ICD-10-CM | POA: Diagnosis not present

## 2022-05-13 DIAGNOSIS — R1011 Right upper quadrant pain: Secondary | ICD-10-CM | POA: Diagnosis not present

## 2022-05-13 DIAGNOSIS — W3400XA Accidental discharge from unspecified firearms or gun, initial encounter: Secondary | ICD-10-CM | POA: Diagnosis not present

## 2022-05-13 DIAGNOSIS — G8911 Acute pain due to trauma: Secondary | ICD-10-CM | POA: Diagnosis not present

## 2022-05-13 DIAGNOSIS — S31109A Unspecified open wound of abdominal wall, unspecified quadrant without penetration into peritoneal cavity, initial encounter: Secondary | ICD-10-CM | POA: Diagnosis not present

## 2022-05-13 DIAGNOSIS — Z933 Colostomy status: Secondary | ICD-10-CM | POA: Diagnosis not present

## 2022-05-13 DIAGNOSIS — F909 Attention-deficit hyperactivity disorder, unspecified type: Secondary | ICD-10-CM | POA: Diagnosis not present

## 2022-05-13 DIAGNOSIS — N50812 Left testicular pain: Secondary | ICD-10-CM | POA: Diagnosis not present

## 2022-05-13 DIAGNOSIS — D72829 Elevated white blood cell count, unspecified: Secondary | ICD-10-CM | POA: Diagnosis not present

## 2022-05-14 DIAGNOSIS — I2699 Other pulmonary embolism without acute cor pulmonale: Secondary | ICD-10-CM | POA: Diagnosis not present

## 2022-05-14 DIAGNOSIS — N50812 Left testicular pain: Secondary | ICD-10-CM | POA: Diagnosis not present

## 2022-05-14 DIAGNOSIS — G8911 Acute pain due to trauma: Secondary | ICD-10-CM | POA: Diagnosis not present

## 2022-05-14 DIAGNOSIS — N433 Hydrocele, unspecified: Secondary | ICD-10-CM | POA: Diagnosis not present

## 2022-05-14 DIAGNOSIS — Z933 Colostomy status: Secondary | ICD-10-CM | POA: Diagnosis not present

## 2022-05-14 DIAGNOSIS — Z0000 Encounter for general adult medical examination without abnormal findings: Secondary | ICD-10-CM | POA: Diagnosis not present

## 2022-05-14 DIAGNOSIS — R29898 Other symptoms and signs involving the musculoskeletal system: Secondary | ICD-10-CM | POA: Diagnosis not present

## 2022-05-14 DIAGNOSIS — S32302B Unspecified fracture of left ilium, initial encounter for open fracture: Secondary | ICD-10-CM | POA: Diagnosis not present

## 2022-05-14 DIAGNOSIS — R1011 Right upper quadrant pain: Secondary | ICD-10-CM | POA: Diagnosis not present

## 2022-05-14 DIAGNOSIS — D72829 Elevated white blood cell count, unspecified: Secondary | ICD-10-CM | POA: Diagnosis not present

## 2022-05-14 DIAGNOSIS — K219 Gastro-esophageal reflux disease without esophagitis: Secondary | ICD-10-CM | POA: Diagnosis not present

## 2022-05-14 DIAGNOSIS — F909 Attention-deficit hyperactivity disorder, unspecified type: Secondary | ICD-10-CM | POA: Diagnosis not present

## 2022-05-14 DIAGNOSIS — S31109A Unspecified open wound of abdominal wall, unspecified quadrant without penetration into peritoneal cavity, initial encounter: Secondary | ICD-10-CM | POA: Diagnosis not present

## 2022-05-15 DIAGNOSIS — Z9049 Acquired absence of other specified parts of digestive tract: Secondary | ICD-10-CM | POA: Diagnosis not present

## 2022-05-15 DIAGNOSIS — K219 Gastro-esophageal reflux disease without esophagitis: Secondary | ICD-10-CM | POA: Diagnosis not present

## 2022-05-15 DIAGNOSIS — F909 Attention-deficit hyperactivity disorder, unspecified type: Secondary | ICD-10-CM | POA: Diagnosis not present

## 2022-05-15 DIAGNOSIS — D72829 Elevated white blood cell count, unspecified: Secondary | ICD-10-CM | POA: Diagnosis not present

## 2022-05-15 DIAGNOSIS — R29898 Other symptoms and signs involving the musculoskeletal system: Secondary | ICD-10-CM | POA: Diagnosis not present

## 2022-05-15 DIAGNOSIS — Z933 Colostomy status: Secondary | ICD-10-CM | POA: Diagnosis not present

## 2022-05-15 DIAGNOSIS — S32302B Unspecified fracture of left ilium, initial encounter for open fracture: Secondary | ICD-10-CM | POA: Diagnosis not present

## 2022-05-15 DIAGNOSIS — W3400XA Accidental discharge from unspecified firearms or gun, initial encounter: Secondary | ICD-10-CM | POA: Diagnosis not present

## 2022-05-15 DIAGNOSIS — G8911 Acute pain due to trauma: Secondary | ICD-10-CM | POA: Diagnosis not present

## 2022-05-16 DIAGNOSIS — F909 Attention-deficit hyperactivity disorder, unspecified type: Secondary | ICD-10-CM | POA: Diagnosis not present

## 2022-05-16 DIAGNOSIS — Z9049 Acquired absence of other specified parts of digestive tract: Secondary | ICD-10-CM | POA: Diagnosis not present

## 2022-05-16 DIAGNOSIS — Z933 Colostomy status: Secondary | ICD-10-CM | POA: Diagnosis not present

## 2022-05-16 DIAGNOSIS — W3400XA Accidental discharge from unspecified firearms or gun, initial encounter: Secondary | ICD-10-CM | POA: Diagnosis not present

## 2022-05-16 DIAGNOSIS — G8911 Acute pain due to trauma: Secondary | ICD-10-CM | POA: Diagnosis not present

## 2022-05-16 DIAGNOSIS — S32302B Unspecified fracture of left ilium, initial encounter for open fracture: Secondary | ICD-10-CM | POA: Diagnosis not present

## 2022-05-16 DIAGNOSIS — K219 Gastro-esophageal reflux disease without esophagitis: Secondary | ICD-10-CM | POA: Diagnosis not present

## 2022-05-16 DIAGNOSIS — D72829 Elevated white blood cell count, unspecified: Secondary | ICD-10-CM | POA: Diagnosis not present

## 2022-05-16 DIAGNOSIS — R29898 Other symptoms and signs involving the musculoskeletal system: Secondary | ICD-10-CM | POA: Diagnosis not present

## 2022-05-17 DIAGNOSIS — F909 Attention-deficit hyperactivity disorder, unspecified type: Secondary | ICD-10-CM | POA: Diagnosis not present

## 2022-05-17 DIAGNOSIS — K219 Gastro-esophageal reflux disease without esophagitis: Secondary | ICD-10-CM | POA: Diagnosis not present

## 2022-05-17 DIAGNOSIS — Z9049 Acquired absence of other specified parts of digestive tract: Secondary | ICD-10-CM | POA: Diagnosis not present

## 2022-05-17 DIAGNOSIS — Z933 Colostomy status: Secondary | ICD-10-CM | POA: Diagnosis not present

## 2022-05-17 DIAGNOSIS — W3400XA Accidental discharge from unspecified firearms or gun, initial encounter: Secondary | ICD-10-CM | POA: Diagnosis not present

## 2022-05-17 DIAGNOSIS — S3991XA Unspecified injury of abdomen, initial encounter: Secondary | ICD-10-CM | POA: Diagnosis not present

## 2022-05-17 DIAGNOSIS — S31109A Unspecified open wound of abdominal wall, unspecified quadrant without penetration into peritoneal cavity, initial encounter: Secondary | ICD-10-CM | POA: Diagnosis not present

## 2022-05-17 DIAGNOSIS — G8911 Acute pain due to trauma: Secondary | ICD-10-CM | POA: Diagnosis not present

## 2022-05-17 DIAGNOSIS — S32302B Unspecified fracture of left ilium, initial encounter for open fracture: Secondary | ICD-10-CM | POA: Diagnosis not present

## 2022-05-17 DIAGNOSIS — R29898 Other symptoms and signs involving the musculoskeletal system: Secondary | ICD-10-CM | POA: Diagnosis not present

## 2022-05-17 DIAGNOSIS — D72829 Elevated white blood cell count, unspecified: Secondary | ICD-10-CM | POA: Diagnosis not present

## 2022-05-17 DIAGNOSIS — Y33XXXA Other specified events, undetermined intent, initial encounter: Secondary | ICD-10-CM | POA: Diagnosis not present

## 2022-05-18 DIAGNOSIS — I7789 Other specified disorders of arteries and arterioles: Secondary | ICD-10-CM | POA: Diagnosis not present

## 2022-05-18 DIAGNOSIS — I2510 Atherosclerotic heart disease of native coronary artery without angina pectoris: Secondary | ICD-10-CM | POA: Diagnosis not present

## 2022-05-20 DIAGNOSIS — S31109A Unspecified open wound of abdominal wall, unspecified quadrant without penetration into peritoneal cavity, initial encounter: Secondary | ICD-10-CM | POA: Diagnosis not present

## 2022-05-22 DIAGNOSIS — R222 Localized swelling, mass and lump, trunk: Secondary | ICD-10-CM | POA: Diagnosis not present

## 2022-05-22 DIAGNOSIS — R600 Localized edema: Secondary | ICD-10-CM | POA: Diagnosis not present

## 2022-05-22 DIAGNOSIS — R1032 Left lower quadrant pain: Secondary | ICD-10-CM | POA: Diagnosis not present

## 2022-05-24 ENCOUNTER — Other Ambulatory Visit: Payer: Self-pay

## 2022-05-24 MED ORDER — HYDROCORTISONE 1 % EX CREA
1.0000 | TOPICAL_CREAM | Freq: Two times a day (BID) | CUTANEOUS | 0 refills | Status: DC | PRN
  Filled 2022-05-24: qty 30, 10d supply, fill #0

## 2022-05-24 MED ORDER — TRAZODONE HCL 100 MG PO TABS
100.0000 mg | ORAL_TABLET | Freq: Every evening | ORAL | 0 refills | Status: DC | PRN
  Filled 2022-05-24: qty 30, 30d supply, fill #0

## 2022-05-24 MED ORDER — MEDI-FIRST TRIPLE ANTIBIOTIC 5-400-5000 MG-UNIT EX OINT: 1.0000 | TOPICAL_OINTMENT | Freq: Two times a day (BID) | CUTANEOUS | 0 refills | Status: AC

## 2022-05-24 MED ORDER — ELIQUIS 5 MG PO TABS
5.0000 mg | ORAL_TABLET | Freq: Two times a day (BID) | ORAL | 0 refills | Status: DC
  Filled 2022-05-24: qty 60, 30d supply, fill #0
  Filled 2022-07-05: qty 60, 30d supply, fill #1

## 2022-05-24 MED ORDER — GABAPENTIN 300 MG PO CAPS
300.0000 mg | ORAL_CAPSULE | Freq: Three times a day (TID) | ORAL | 0 refills | Status: DC
  Filled 2022-05-24: qty 90, 30d supply, fill #0

## 2022-05-24 MED ORDER — OXYCODONE HCL 5 MG PO TABS
5.0000 mg | ORAL_TABLET | Freq: Four times a day (QID) | ORAL | 0 refills | Status: DC
  Filled 2022-05-24: qty 40, 5d supply, fill #0

## 2022-05-24 MED ORDER — TRIAMCINOLONE ACETONIDE 0.1 % EX CREA
1.0000 | TOPICAL_CREAM | Freq: Two times a day (BID) | CUTANEOUS | 0 refills | Status: DC | PRN
  Filled 2022-05-24: qty 15, 8d supply, fill #0

## 2022-05-24 MED ORDER — AMOXICILLIN-POT CLAVULANATE 875-125 MG PO TABS
1.0000 | ORAL_TABLET | Freq: Two times a day (BID) | ORAL | 0 refills | Status: DC
  Filled 2022-05-24: qty 20, 10d supply, fill #0

## 2022-05-24 MED ORDER — CELECOXIB 200 MG PO CAPS
200.0000 mg | ORAL_CAPSULE | Freq: Every day | ORAL | 0 refills | Status: DC
  Filled 2022-05-24: qty 30, 30d supply, fill #0

## 2022-05-25 ENCOUNTER — Other Ambulatory Visit: Payer: Self-pay

## 2022-05-27 ENCOUNTER — Other Ambulatory Visit: Payer: Self-pay

## 2022-05-27 MED ORDER — AMPHETAMINE-DEXTROAMPHETAMINE 20 MG PO TABS
20.0000 mg | ORAL_TABLET | Freq: Two times a day (BID) | ORAL | 0 refills | Status: DC
  Filled 2022-05-27: qty 60, 30d supply, fill #0

## 2022-05-28 ENCOUNTER — Other Ambulatory Visit: Payer: Self-pay

## 2022-05-28 DIAGNOSIS — K219 Gastro-esophageal reflux disease without esophagitis: Secondary | ICD-10-CM | POA: Diagnosis not present

## 2022-05-28 DIAGNOSIS — W3400XA Accidental discharge from unspecified firearms or gun, initial encounter: Secondary | ICD-10-CM | POA: Diagnosis not present

## 2022-05-28 DIAGNOSIS — M86152 Other acute osteomyelitis, left femur: Secondary | ICD-10-CM | POA: Diagnosis not present

## 2022-05-28 DIAGNOSIS — T1490XA Injury, unspecified, initial encounter: Secondary | ICD-10-CM | POA: Diagnosis not present

## 2022-05-28 DIAGNOSIS — Y33XXXA Other specified events, undetermined intent, initial encounter: Secondary | ICD-10-CM | POA: Diagnosis not present

## 2022-05-28 DIAGNOSIS — Z885 Allergy status to narcotic agent status: Secondary | ICD-10-CM | POA: Diagnosis not present

## 2022-05-28 DIAGNOSIS — T8189XA Other complications of procedures, not elsewhere classified, initial encounter: Secondary | ICD-10-CM | POA: Diagnosis not present

## 2022-05-28 DIAGNOSIS — Z4689 Encounter for fitting and adjustment of other specified devices: Secondary | ICD-10-CM | POA: Diagnosis not present

## 2022-05-28 DIAGNOSIS — S30821D Blister (nonthermal) of abdominal wall, subsequent encounter: Secondary | ICD-10-CM | POA: Diagnosis not present

## 2022-05-28 DIAGNOSIS — R1013 Epigastric pain: Secondary | ICD-10-CM | POA: Diagnosis not present

## 2022-05-28 DIAGNOSIS — T8149XA Infection following a procedure, other surgical site, initial encounter: Secondary | ICD-10-CM | POA: Diagnosis not present

## 2022-05-28 DIAGNOSIS — S36498A Other injury of other part of small intestine, initial encounter: Secondary | ICD-10-CM | POA: Diagnosis not present

## 2022-05-28 DIAGNOSIS — R000 Tachycardia, unspecified: Secondary | ICD-10-CM | POA: Diagnosis not present

## 2022-05-28 DIAGNOSIS — J9811 Atelectasis: Secondary | ICD-10-CM | POA: Diagnosis not present

## 2022-05-28 DIAGNOSIS — L0291 Cutaneous abscess, unspecified: Secondary | ICD-10-CM | POA: Diagnosis not present

## 2022-05-28 DIAGNOSIS — S36592A Other injury of descending [left] colon, initial encounter: Secondary | ICD-10-CM | POA: Diagnosis not present

## 2022-05-28 DIAGNOSIS — M869 Osteomyelitis, unspecified: Secondary | ICD-10-CM | POA: Diagnosis not present

## 2022-05-28 DIAGNOSIS — E7421 Galactosemia: Secondary | ICD-10-CM | POA: Diagnosis not present

## 2022-05-28 DIAGNOSIS — S31149A Puncture wound of abdominal wall with foreign body, unspecified quadrant without penetration into peritoneal cavity, initial encounter: Secondary | ICD-10-CM | POA: Diagnosis not present

## 2022-05-28 DIAGNOSIS — R55 Syncope and collapse: Secondary | ICD-10-CM | POA: Diagnosis not present

## 2022-05-28 DIAGNOSIS — F909 Attention-deficit hyperactivity disorder, unspecified type: Secondary | ICD-10-CM | POA: Diagnosis not present

## 2022-05-28 DIAGNOSIS — Z933 Colostomy status: Secondary | ICD-10-CM | POA: Diagnosis not present

## 2022-05-28 DIAGNOSIS — W3400XD Accidental discharge from unspecified firearms or gun, subsequent encounter: Secondary | ICD-10-CM | POA: Diagnosis not present

## 2022-05-28 DIAGNOSIS — S31639A Puncture wound without foreign body of abdominal wall, unspecified quadrant with penetration into peritoneal cavity, initial encounter: Secondary | ICD-10-CM | POA: Diagnosis not present

## 2022-05-28 DIAGNOSIS — Z7901 Long term (current) use of anticoagulants: Secondary | ICD-10-CM | POA: Diagnosis not present

## 2022-05-28 DIAGNOSIS — K659 Peritonitis, unspecified: Secondary | ICD-10-CM | POA: Diagnosis not present

## 2022-05-28 DIAGNOSIS — F908 Attention-deficit hyperactivity disorder, other type: Secondary | ICD-10-CM | POA: Diagnosis not present

## 2022-05-28 DIAGNOSIS — B9689 Other specified bacterial agents as the cause of diseases classified elsewhere: Secondary | ICD-10-CM | POA: Diagnosis not present

## 2022-05-28 DIAGNOSIS — R61 Generalized hyperhidrosis: Secondary | ICD-10-CM | POA: Diagnosis not present

## 2022-05-28 DIAGNOSIS — S32302A Unspecified fracture of left ilium, initial encounter for closed fracture: Secondary | ICD-10-CM | POA: Diagnosis not present

## 2022-05-28 DIAGNOSIS — S32302B Unspecified fracture of left ilium, initial encounter for open fracture: Secondary | ICD-10-CM | POA: Diagnosis not present

## 2022-05-28 DIAGNOSIS — T888XXA Other specified complications of surgical and medical care, not elsewhere classified, initial encounter: Secondary | ICD-10-CM | POA: Diagnosis not present

## 2022-05-28 DIAGNOSIS — Z792 Long term (current) use of antibiotics: Secondary | ICD-10-CM | POA: Diagnosis not present

## 2022-05-28 DIAGNOSIS — M86159 Other acute osteomyelitis, unspecified femur: Secondary | ICD-10-CM | POA: Diagnosis not present

## 2022-05-28 DIAGNOSIS — Z79899 Other long term (current) drug therapy: Secondary | ICD-10-CM | POA: Diagnosis not present

## 2022-05-30 DIAGNOSIS — S30821D Blister (nonthermal) of abdominal wall, subsequent encounter: Secondary | ICD-10-CM | POA: Diagnosis not present

## 2022-05-30 DIAGNOSIS — F908 Attention-deficit hyperactivity disorder, other type: Secondary | ICD-10-CM | POA: Diagnosis not present

## 2022-05-30 DIAGNOSIS — S32302B Unspecified fracture of left ilium, initial encounter for open fracture: Secondary | ICD-10-CM | POA: Diagnosis not present

## 2022-05-30 DIAGNOSIS — W3400XD Accidental discharge from unspecified firearms or gun, subsequent encounter: Secondary | ICD-10-CM | POA: Diagnosis not present

## 2022-05-30 DIAGNOSIS — Z933 Colostomy status: Secondary | ICD-10-CM | POA: Diagnosis not present

## 2022-05-30 DIAGNOSIS — K219 Gastro-esophageal reflux disease without esophagitis: Secondary | ICD-10-CM | POA: Diagnosis not present

## 2022-05-30 DIAGNOSIS — E7421 Galactosemia: Secondary | ICD-10-CM | POA: Diagnosis not present

## 2022-06-07 ENCOUNTER — Other Ambulatory Visit: Payer: Self-pay

## 2022-06-07 MED ORDER — AMOXICILLIN-POT CLAVULANATE 875-125 MG PO TABS
875.0000 mg | ORAL_TABLET | Freq: Two times a day (BID) | ORAL | 0 refills | Status: DC
  Filled 2022-06-07: qty 96, 48d supply, fill #0
  Filled 2022-06-07: qty 20, 10d supply, fill #0

## 2022-06-07 MED ORDER — GABAPENTIN 400 MG PO CAPS
800.0000 mg | ORAL_CAPSULE | Freq: Three times a day (TID) | ORAL | 0 refills | Status: DC
  Filled 2022-06-07: qty 60, 10d supply, fill #0

## 2022-06-07 MED ORDER — SIMETHICONE 80 MG PO CHEW: CHEWABLE_TABLET | ORAL | 0 refills | Status: DC

## 2022-06-07 MED ORDER — PANTOPRAZOLE SODIUM 40 MG PO TBEC
40.0000 mg | DELAYED_RELEASE_TABLET | Freq: Two times a day (BID) | ORAL | 0 refills | Status: DC
  Filled 2022-06-07: qty 60, 30d supply, fill #0

## 2022-06-07 MED ORDER — ONDANSETRON HCL 4 MG PO TABS
4.0000 mg | ORAL_TABLET | Freq: Three times a day (TID) | ORAL | 0 refills | Status: DC | PRN
  Filled 2022-06-07: qty 20, 7d supply, fill #0

## 2022-06-07 MED ORDER — TAMSULOSIN HCL 0.4 MG PO CAPS
0.4000 mg | ORAL_CAPSULE | Freq: Every day | ORAL | 0 refills | Status: DC
  Filled 2022-06-07: qty 15, 15d supply, fill #0

## 2022-06-07 MED ORDER — ACETAMINOPHEN 325 MG PO TABS: 975.0000 mg | ORAL_TABLET | Freq: Four times a day (QID) | ORAL | 0 refills | Status: DC

## 2022-06-07 MED ORDER — POLYETHYLENE GLYCOL 3350 17 GM/SCOOP PO POWD
ORAL | 0 refills | Status: DC
  Filled 2022-06-07: qty 238, 15d supply, fill #0

## 2022-06-07 MED ORDER — OXYCODONE HCL 5 MG PO TABS
5.0000 mg | ORAL_TABLET | ORAL | 0 refills | Status: DC | PRN
  Filled 2022-06-07: qty 20, 4d supply, fill #0

## 2022-06-08 ENCOUNTER — Other Ambulatory Visit: Payer: Self-pay

## 2022-06-08 DIAGNOSIS — L02211 Cutaneous abscess of abdominal wall: Secondary | ICD-10-CM | POA: Diagnosis not present

## 2022-06-08 DIAGNOSIS — D72829 Elevated white blood cell count, unspecified: Secondary | ICD-10-CM | POA: Diagnosis not present

## 2022-06-08 DIAGNOSIS — G8911 Acute pain due to trauma: Secondary | ICD-10-CM | POA: Diagnosis not present

## 2022-06-08 DIAGNOSIS — Z791 Long term (current) use of non-steroidal anti-inflammatories (NSAID): Secondary | ICD-10-CM | POA: Diagnosis not present

## 2022-06-08 DIAGNOSIS — Z433 Encounter for attention to colostomy: Secondary | ICD-10-CM | POA: Diagnosis not present

## 2022-06-08 DIAGNOSIS — I2699 Other pulmonary embolism without acute cor pulmonale: Secondary | ICD-10-CM | POA: Diagnosis not present

## 2022-06-08 DIAGNOSIS — F909 Attention-deficit hyperactivity disorder, unspecified type: Secondary | ICD-10-CM | POA: Diagnosis not present

## 2022-06-08 DIAGNOSIS — K219 Gastro-esophageal reflux disease without esophagitis: Secondary | ICD-10-CM | POA: Diagnosis not present

## 2022-06-08 DIAGNOSIS — T8149XA Infection following a procedure, other surgical site, initial encounter: Secondary | ICD-10-CM | POA: Diagnosis not present

## 2022-06-08 DIAGNOSIS — Z792 Long term (current) use of antibiotics: Secondary | ICD-10-CM | POA: Diagnosis not present

## 2022-06-11 DIAGNOSIS — F909 Attention-deficit hyperactivity disorder, unspecified type: Secondary | ICD-10-CM | POA: Diagnosis not present

## 2022-06-11 DIAGNOSIS — G8911 Acute pain due to trauma: Secondary | ICD-10-CM | POA: Diagnosis not present

## 2022-06-11 DIAGNOSIS — L02211 Cutaneous abscess of abdominal wall: Secondary | ICD-10-CM | POA: Diagnosis not present

## 2022-06-11 DIAGNOSIS — Z792 Long term (current) use of antibiotics: Secondary | ICD-10-CM | POA: Diagnosis not present

## 2022-06-11 DIAGNOSIS — Z433 Encounter for attention to colostomy: Secondary | ICD-10-CM | POA: Diagnosis not present

## 2022-06-11 DIAGNOSIS — Z791 Long term (current) use of non-steroidal anti-inflammatories (NSAID): Secondary | ICD-10-CM | POA: Diagnosis not present

## 2022-06-11 DIAGNOSIS — D72829 Elevated white blood cell count, unspecified: Secondary | ICD-10-CM | POA: Diagnosis not present

## 2022-06-11 DIAGNOSIS — K219 Gastro-esophageal reflux disease without esophagitis: Secondary | ICD-10-CM | POA: Diagnosis not present

## 2022-06-11 DIAGNOSIS — I2699 Other pulmonary embolism without acute cor pulmonale: Secondary | ICD-10-CM | POA: Diagnosis not present

## 2022-06-11 DIAGNOSIS — T8149XA Infection following a procedure, other surgical site, initial encounter: Secondary | ICD-10-CM | POA: Diagnosis not present

## 2022-06-12 DIAGNOSIS — D72829 Elevated white blood cell count, unspecified: Secondary | ICD-10-CM | POA: Diagnosis not present

## 2022-06-12 DIAGNOSIS — T8189XA Other complications of procedures, not elsewhere classified, initial encounter: Secondary | ICD-10-CM | POA: Diagnosis not present

## 2022-06-12 DIAGNOSIS — I2699 Other pulmonary embolism without acute cor pulmonale: Secondary | ICD-10-CM | POA: Diagnosis not present

## 2022-06-12 DIAGNOSIS — Z791 Long term (current) use of non-steroidal anti-inflammatories (NSAID): Secondary | ICD-10-CM | POA: Diagnosis not present

## 2022-06-12 DIAGNOSIS — Z433 Encounter for attention to colostomy: Secondary | ICD-10-CM | POA: Diagnosis not present

## 2022-06-12 DIAGNOSIS — G8911 Acute pain due to trauma: Secondary | ICD-10-CM | POA: Diagnosis not present

## 2022-06-12 DIAGNOSIS — K219 Gastro-esophageal reflux disease without esophagitis: Secondary | ICD-10-CM | POA: Diagnosis not present

## 2022-06-12 DIAGNOSIS — T8149XA Infection following a procedure, other surgical site, initial encounter: Secondary | ICD-10-CM | POA: Diagnosis not present

## 2022-06-12 DIAGNOSIS — L02211 Cutaneous abscess of abdominal wall: Secondary | ICD-10-CM | POA: Diagnosis not present

## 2022-06-12 DIAGNOSIS — F909 Attention-deficit hyperactivity disorder, unspecified type: Secondary | ICD-10-CM | POA: Diagnosis not present

## 2022-06-12 DIAGNOSIS — Z792 Long term (current) use of antibiotics: Secondary | ICD-10-CM | POA: Diagnosis not present

## 2022-06-15 DIAGNOSIS — F909 Attention-deficit hyperactivity disorder, unspecified type: Secondary | ICD-10-CM | POA: Diagnosis not present

## 2022-06-15 DIAGNOSIS — Z791 Long term (current) use of non-steroidal anti-inflammatories (NSAID): Secondary | ICD-10-CM | POA: Diagnosis not present

## 2022-06-15 DIAGNOSIS — I2699 Other pulmonary embolism without acute cor pulmonale: Secondary | ICD-10-CM | POA: Diagnosis not present

## 2022-06-15 DIAGNOSIS — Z792 Long term (current) use of antibiotics: Secondary | ICD-10-CM | POA: Diagnosis not present

## 2022-06-15 DIAGNOSIS — Z433 Encounter for attention to colostomy: Secondary | ICD-10-CM | POA: Diagnosis not present

## 2022-06-15 DIAGNOSIS — K219 Gastro-esophageal reflux disease without esophagitis: Secondary | ICD-10-CM | POA: Diagnosis not present

## 2022-06-15 DIAGNOSIS — T8149XA Infection following a procedure, other surgical site, initial encounter: Secondary | ICD-10-CM | POA: Diagnosis not present

## 2022-06-15 DIAGNOSIS — G8911 Acute pain due to trauma: Secondary | ICD-10-CM | POA: Diagnosis not present

## 2022-06-15 DIAGNOSIS — L02211 Cutaneous abscess of abdominal wall: Secondary | ICD-10-CM | POA: Diagnosis not present

## 2022-06-15 DIAGNOSIS — D72829 Elevated white blood cell count, unspecified: Secondary | ICD-10-CM | POA: Diagnosis not present

## 2022-06-18 DIAGNOSIS — T8149XA Infection following a procedure, other surgical site, initial encounter: Secondary | ICD-10-CM | POA: Diagnosis not present

## 2022-06-18 DIAGNOSIS — D72829 Elevated white blood cell count, unspecified: Secondary | ICD-10-CM | POA: Diagnosis not present

## 2022-06-18 DIAGNOSIS — G8911 Acute pain due to trauma: Secondary | ICD-10-CM | POA: Diagnosis not present

## 2022-06-18 DIAGNOSIS — L02211 Cutaneous abscess of abdominal wall: Secondary | ICD-10-CM | POA: Diagnosis not present

## 2022-06-18 DIAGNOSIS — I2699 Other pulmonary embolism without acute cor pulmonale: Secondary | ICD-10-CM | POA: Diagnosis not present

## 2022-06-18 DIAGNOSIS — Z791 Long term (current) use of non-steroidal anti-inflammatories (NSAID): Secondary | ICD-10-CM | POA: Diagnosis not present

## 2022-06-18 DIAGNOSIS — S31109A Unspecified open wound of abdominal wall, unspecified quadrant without penetration into peritoneal cavity, initial encounter: Secondary | ICD-10-CM | POA: Diagnosis not present

## 2022-06-18 DIAGNOSIS — S71002A Unspecified open wound, left hip, initial encounter: Secondary | ICD-10-CM | POA: Diagnosis not present

## 2022-06-18 DIAGNOSIS — Z433 Encounter for attention to colostomy: Secondary | ICD-10-CM | POA: Diagnosis not present

## 2022-06-18 DIAGNOSIS — F909 Attention-deficit hyperactivity disorder, unspecified type: Secondary | ICD-10-CM | POA: Diagnosis not present

## 2022-06-18 DIAGNOSIS — Z792 Long term (current) use of antibiotics: Secondary | ICD-10-CM | POA: Diagnosis not present

## 2022-06-18 DIAGNOSIS — K219 Gastro-esophageal reflux disease without esophagitis: Secondary | ICD-10-CM | POA: Diagnosis not present

## 2022-06-18 DIAGNOSIS — T8131XA Disruption of external operation (surgical) wound, not elsewhere classified, initial encounter: Secondary | ICD-10-CM | POA: Diagnosis not present

## 2022-06-19 DIAGNOSIS — Z791 Long term (current) use of non-steroidal anti-inflammatories (NSAID): Secondary | ICD-10-CM | POA: Diagnosis not present

## 2022-06-19 DIAGNOSIS — K219 Gastro-esophageal reflux disease without esophagitis: Secondary | ICD-10-CM | POA: Diagnosis not present

## 2022-06-19 DIAGNOSIS — F909 Attention-deficit hyperactivity disorder, unspecified type: Secondary | ICD-10-CM | POA: Diagnosis not present

## 2022-06-19 DIAGNOSIS — T8149XA Infection following a procedure, other surgical site, initial encounter: Secondary | ICD-10-CM | POA: Diagnosis not present

## 2022-06-19 DIAGNOSIS — I2699 Other pulmonary embolism without acute cor pulmonale: Secondary | ICD-10-CM | POA: Diagnosis not present

## 2022-06-19 DIAGNOSIS — G8911 Acute pain due to trauma: Secondary | ICD-10-CM | POA: Diagnosis not present

## 2022-06-19 DIAGNOSIS — Z792 Long term (current) use of antibiotics: Secondary | ICD-10-CM | POA: Diagnosis not present

## 2022-06-19 DIAGNOSIS — L02211 Cutaneous abscess of abdominal wall: Secondary | ICD-10-CM | POA: Diagnosis not present

## 2022-06-19 DIAGNOSIS — Z433 Encounter for attention to colostomy: Secondary | ICD-10-CM | POA: Diagnosis not present

## 2022-06-19 DIAGNOSIS — D72829 Elevated white blood cell count, unspecified: Secondary | ICD-10-CM | POA: Diagnosis not present

## 2022-06-20 DIAGNOSIS — D72829 Elevated white blood cell count, unspecified: Secondary | ICD-10-CM | POA: Diagnosis not present

## 2022-06-20 DIAGNOSIS — T8149XA Infection following a procedure, other surgical site, initial encounter: Secondary | ICD-10-CM | POA: Diagnosis not present

## 2022-06-20 DIAGNOSIS — Z792 Long term (current) use of antibiotics: Secondary | ICD-10-CM | POA: Diagnosis not present

## 2022-06-20 DIAGNOSIS — T8131XA Disruption of external operation (surgical) wound, not elsewhere classified, initial encounter: Secondary | ICD-10-CM | POA: Diagnosis not present

## 2022-06-20 DIAGNOSIS — I2699 Other pulmonary embolism without acute cor pulmonale: Secondary | ICD-10-CM | POA: Diagnosis not present

## 2022-06-20 DIAGNOSIS — S31109A Unspecified open wound of abdominal wall, unspecified quadrant without penetration into peritoneal cavity, initial encounter: Secondary | ICD-10-CM | POA: Diagnosis not present

## 2022-06-20 DIAGNOSIS — F909 Attention-deficit hyperactivity disorder, unspecified type: Secondary | ICD-10-CM | POA: Diagnosis not present

## 2022-06-20 DIAGNOSIS — L02211 Cutaneous abscess of abdominal wall: Secondary | ICD-10-CM | POA: Diagnosis not present

## 2022-06-20 DIAGNOSIS — Z791 Long term (current) use of non-steroidal anti-inflammatories (NSAID): Secondary | ICD-10-CM | POA: Diagnosis not present

## 2022-06-20 DIAGNOSIS — S71002A Unspecified open wound, left hip, initial encounter: Secondary | ICD-10-CM | POA: Diagnosis not present

## 2022-06-20 DIAGNOSIS — G8911 Acute pain due to trauma: Secondary | ICD-10-CM | POA: Diagnosis not present

## 2022-06-20 DIAGNOSIS — K219 Gastro-esophageal reflux disease without esophagitis: Secondary | ICD-10-CM | POA: Diagnosis not present

## 2022-06-20 DIAGNOSIS — Z433 Encounter for attention to colostomy: Secondary | ICD-10-CM | POA: Diagnosis not present

## 2022-06-21 ENCOUNTER — Other Ambulatory Visit: Payer: Self-pay

## 2022-06-21 DIAGNOSIS — D72829 Elevated white blood cell count, unspecified: Secondary | ICD-10-CM | POA: Diagnosis not present

## 2022-06-21 DIAGNOSIS — Z792 Long term (current) use of antibiotics: Secondary | ICD-10-CM | POA: Diagnosis not present

## 2022-06-21 DIAGNOSIS — F909 Attention-deficit hyperactivity disorder, unspecified type: Secondary | ICD-10-CM | POA: Diagnosis not present

## 2022-06-21 DIAGNOSIS — T8149XA Infection following a procedure, other surgical site, initial encounter: Secondary | ICD-10-CM | POA: Diagnosis not present

## 2022-06-21 DIAGNOSIS — Z433 Encounter for attention to colostomy: Secondary | ICD-10-CM | POA: Diagnosis not present

## 2022-06-21 DIAGNOSIS — I2699 Other pulmonary embolism without acute cor pulmonale: Secondary | ICD-10-CM | POA: Diagnosis not present

## 2022-06-21 DIAGNOSIS — L02211 Cutaneous abscess of abdominal wall: Secondary | ICD-10-CM | POA: Diagnosis not present

## 2022-06-21 DIAGNOSIS — K219 Gastro-esophageal reflux disease without esophagitis: Secondary | ICD-10-CM | POA: Diagnosis not present

## 2022-06-21 DIAGNOSIS — G8911 Acute pain due to trauma: Secondary | ICD-10-CM | POA: Diagnosis not present

## 2022-06-21 DIAGNOSIS — Z791 Long term (current) use of non-steroidal anti-inflammatories (NSAID): Secondary | ICD-10-CM | POA: Diagnosis not present

## 2022-06-21 MED ORDER — GABAPENTIN 300 MG PO CAPS
900.0000 mg | ORAL_CAPSULE | Freq: Three times a day (TID) | ORAL | 1 refills | Status: DC
  Filled 2022-06-21: qty 270, 30d supply, fill #0

## 2022-06-21 MED ORDER — METHOCARBAMOL 750 MG PO TABS
750.0000 mg | ORAL_TABLET | Freq: Three times a day (TID) | ORAL | 0 refills | Status: DC | PRN
  Filled 2022-06-21: qty 30, 10d supply, fill #0

## 2022-06-22 DIAGNOSIS — Z792 Long term (current) use of antibiotics: Secondary | ICD-10-CM | POA: Diagnosis not present

## 2022-06-22 DIAGNOSIS — K219 Gastro-esophageal reflux disease without esophagitis: Secondary | ICD-10-CM | POA: Diagnosis not present

## 2022-06-22 DIAGNOSIS — D72829 Elevated white blood cell count, unspecified: Secondary | ICD-10-CM | POA: Diagnosis not present

## 2022-06-22 DIAGNOSIS — Z433 Encounter for attention to colostomy: Secondary | ICD-10-CM | POA: Diagnosis not present

## 2022-06-22 DIAGNOSIS — I2699 Other pulmonary embolism without acute cor pulmonale: Secondary | ICD-10-CM | POA: Diagnosis not present

## 2022-06-22 DIAGNOSIS — S31109A Unspecified open wound of abdominal wall, unspecified quadrant without penetration into peritoneal cavity, initial encounter: Secondary | ICD-10-CM | POA: Diagnosis not present

## 2022-06-22 DIAGNOSIS — T8149XA Infection following a procedure, other surgical site, initial encounter: Secondary | ICD-10-CM | POA: Diagnosis not present

## 2022-06-22 DIAGNOSIS — F909 Attention-deficit hyperactivity disorder, unspecified type: Secondary | ICD-10-CM | POA: Diagnosis not present

## 2022-06-22 DIAGNOSIS — L02211 Cutaneous abscess of abdominal wall: Secondary | ICD-10-CM | POA: Diagnosis not present

## 2022-06-22 DIAGNOSIS — Z791 Long term (current) use of non-steroidal anti-inflammatories (NSAID): Secondary | ICD-10-CM | POA: Diagnosis not present

## 2022-06-22 DIAGNOSIS — G8911 Acute pain due to trauma: Secondary | ICD-10-CM | POA: Diagnosis not present

## 2022-06-22 DIAGNOSIS — S71002A Unspecified open wound, left hip, initial encounter: Secondary | ICD-10-CM | POA: Diagnosis not present

## 2022-06-25 DIAGNOSIS — S71002A Unspecified open wound, left hip, initial encounter: Secondary | ICD-10-CM | POA: Diagnosis not present

## 2022-06-25 DIAGNOSIS — Z433 Encounter for attention to colostomy: Secondary | ICD-10-CM | POA: Diagnosis not present

## 2022-06-25 DIAGNOSIS — Z791 Long term (current) use of non-steroidal anti-inflammatories (NSAID): Secondary | ICD-10-CM | POA: Diagnosis not present

## 2022-06-25 DIAGNOSIS — D72829 Elevated white blood cell count, unspecified: Secondary | ICD-10-CM | POA: Diagnosis not present

## 2022-06-25 DIAGNOSIS — G8911 Acute pain due to trauma: Secondary | ICD-10-CM | POA: Diagnosis not present

## 2022-06-25 DIAGNOSIS — T8131XA Disruption of external operation (surgical) wound, not elsewhere classified, initial encounter: Secondary | ICD-10-CM | POA: Diagnosis not present

## 2022-06-25 DIAGNOSIS — Z792 Long term (current) use of antibiotics: Secondary | ICD-10-CM | POA: Diagnosis not present

## 2022-06-25 DIAGNOSIS — K219 Gastro-esophageal reflux disease without esophagitis: Secondary | ICD-10-CM | POA: Diagnosis not present

## 2022-06-25 DIAGNOSIS — F909 Attention-deficit hyperactivity disorder, unspecified type: Secondary | ICD-10-CM | POA: Diagnosis not present

## 2022-06-25 DIAGNOSIS — T8149XA Infection following a procedure, other surgical site, initial encounter: Secondary | ICD-10-CM | POA: Diagnosis not present

## 2022-06-25 DIAGNOSIS — L02211 Cutaneous abscess of abdominal wall: Secondary | ICD-10-CM | POA: Diagnosis not present

## 2022-06-25 DIAGNOSIS — I2699 Other pulmonary embolism without acute cor pulmonale: Secondary | ICD-10-CM | POA: Diagnosis not present

## 2022-06-27 ENCOUNTER — Other Ambulatory Visit: Payer: Self-pay

## 2022-06-27 DIAGNOSIS — K219 Gastro-esophageal reflux disease without esophagitis: Secondary | ICD-10-CM | POA: Diagnosis not present

## 2022-06-27 DIAGNOSIS — Z433 Encounter for attention to colostomy: Secondary | ICD-10-CM | POA: Diagnosis not present

## 2022-06-27 DIAGNOSIS — L02211 Cutaneous abscess of abdominal wall: Secondary | ICD-10-CM | POA: Diagnosis not present

## 2022-06-27 DIAGNOSIS — Z792 Long term (current) use of antibiotics: Secondary | ICD-10-CM | POA: Diagnosis not present

## 2022-06-27 DIAGNOSIS — F909 Attention-deficit hyperactivity disorder, unspecified type: Secondary | ICD-10-CM | POA: Diagnosis not present

## 2022-06-27 DIAGNOSIS — Z791 Long term (current) use of non-steroidal anti-inflammatories (NSAID): Secondary | ICD-10-CM | POA: Diagnosis not present

## 2022-06-27 DIAGNOSIS — T8149XA Infection following a procedure, other surgical site, initial encounter: Secondary | ICD-10-CM | POA: Diagnosis not present

## 2022-06-27 DIAGNOSIS — I2699 Other pulmonary embolism without acute cor pulmonale: Secondary | ICD-10-CM | POA: Diagnosis not present

## 2022-06-27 DIAGNOSIS — G8911 Acute pain due to trauma: Secondary | ICD-10-CM | POA: Diagnosis not present

## 2022-06-27 DIAGNOSIS — D72829 Elevated white blood cell count, unspecified: Secondary | ICD-10-CM | POA: Diagnosis not present

## 2022-06-27 MED ORDER — DULOXETINE HCL 20 MG PO CPEP
20.0000 mg | ORAL_CAPSULE | Freq: Every day | ORAL | 1 refills | Status: DC
  Filled 2022-06-27: qty 30, 30d supply, fill #0

## 2022-06-27 MED ORDER — PREGABALIN 50 MG PO CAPS
50.0000 mg | ORAL_CAPSULE | Freq: Two times a day (BID) | ORAL | 0 refills | Status: DC
  Filled 2022-06-27: qty 60, 30d supply, fill #0

## 2022-06-29 ENCOUNTER — Other Ambulatory Visit: Payer: Self-pay

## 2022-06-29 DIAGNOSIS — D72829 Elevated white blood cell count, unspecified: Secondary | ICD-10-CM | POA: Diagnosis not present

## 2022-06-29 DIAGNOSIS — I2699 Other pulmonary embolism without acute cor pulmonale: Secondary | ICD-10-CM | POA: Diagnosis not present

## 2022-06-29 DIAGNOSIS — Z791 Long term (current) use of non-steroidal anti-inflammatories (NSAID): Secondary | ICD-10-CM | POA: Diagnosis not present

## 2022-06-29 DIAGNOSIS — F909 Attention-deficit hyperactivity disorder, unspecified type: Secondary | ICD-10-CM | POA: Diagnosis not present

## 2022-06-29 DIAGNOSIS — Z433 Encounter for attention to colostomy: Secondary | ICD-10-CM | POA: Diagnosis not present

## 2022-06-29 DIAGNOSIS — L02211 Cutaneous abscess of abdominal wall: Secondary | ICD-10-CM | POA: Diagnosis not present

## 2022-06-29 DIAGNOSIS — T8149XA Infection following a procedure, other surgical site, initial encounter: Secondary | ICD-10-CM | POA: Diagnosis not present

## 2022-06-29 DIAGNOSIS — K219 Gastro-esophageal reflux disease without esophagitis: Secondary | ICD-10-CM | POA: Diagnosis not present

## 2022-06-29 DIAGNOSIS — Z792 Long term (current) use of antibiotics: Secondary | ICD-10-CM | POA: Diagnosis not present

## 2022-06-29 DIAGNOSIS — G8911 Acute pain due to trauma: Secondary | ICD-10-CM | POA: Diagnosis not present

## 2022-06-29 MED ORDER — OXYCODONE HCL 5 MG PO TABS
5.0000 mg | ORAL_TABLET | Freq: Two times a day (BID) | ORAL | 0 refills | Status: DC | PRN
  Filled 2022-06-29: qty 20, 10d supply, fill #0

## 2022-07-02 DIAGNOSIS — L02211 Cutaneous abscess of abdominal wall: Secondary | ICD-10-CM | POA: Diagnosis not present

## 2022-07-02 DIAGNOSIS — I2699 Other pulmonary embolism without acute cor pulmonale: Secondary | ICD-10-CM | POA: Diagnosis not present

## 2022-07-02 DIAGNOSIS — K219 Gastro-esophageal reflux disease without esophagitis: Secondary | ICD-10-CM | POA: Diagnosis not present

## 2022-07-02 DIAGNOSIS — F909 Attention-deficit hyperactivity disorder, unspecified type: Secondary | ICD-10-CM | POA: Diagnosis not present

## 2022-07-02 DIAGNOSIS — Z791 Long term (current) use of non-steroidal anti-inflammatories (NSAID): Secondary | ICD-10-CM | POA: Diagnosis not present

## 2022-07-02 DIAGNOSIS — Z792 Long term (current) use of antibiotics: Secondary | ICD-10-CM | POA: Diagnosis not present

## 2022-07-02 DIAGNOSIS — D72829 Elevated white blood cell count, unspecified: Secondary | ICD-10-CM | POA: Diagnosis not present

## 2022-07-02 DIAGNOSIS — G8911 Acute pain due to trauma: Secondary | ICD-10-CM | POA: Diagnosis not present

## 2022-07-02 DIAGNOSIS — Z433 Encounter for attention to colostomy: Secondary | ICD-10-CM | POA: Diagnosis not present

## 2022-07-02 DIAGNOSIS — T8149XA Infection following a procedure, other surgical site, initial encounter: Secondary | ICD-10-CM | POA: Diagnosis not present

## 2022-07-03 DIAGNOSIS — T8131XA Disruption of external operation (surgical) wound, not elsewhere classified, initial encounter: Secondary | ICD-10-CM | POA: Diagnosis not present

## 2022-07-03 DIAGNOSIS — S31109A Unspecified open wound of abdominal wall, unspecified quadrant without penetration into peritoneal cavity, initial encounter: Secondary | ICD-10-CM | POA: Diagnosis not present

## 2022-07-04 DIAGNOSIS — Z791 Long term (current) use of non-steroidal anti-inflammatories (NSAID): Secondary | ICD-10-CM | POA: Diagnosis not present

## 2022-07-04 DIAGNOSIS — T8149XA Infection following a procedure, other surgical site, initial encounter: Secondary | ICD-10-CM | POA: Diagnosis not present

## 2022-07-04 DIAGNOSIS — K219 Gastro-esophageal reflux disease without esophagitis: Secondary | ICD-10-CM | POA: Diagnosis not present

## 2022-07-04 DIAGNOSIS — Z792 Long term (current) use of antibiotics: Secondary | ICD-10-CM | POA: Diagnosis not present

## 2022-07-04 DIAGNOSIS — F909 Attention-deficit hyperactivity disorder, unspecified type: Secondary | ICD-10-CM | POA: Diagnosis not present

## 2022-07-04 DIAGNOSIS — I2699 Other pulmonary embolism without acute cor pulmonale: Secondary | ICD-10-CM | POA: Diagnosis not present

## 2022-07-04 DIAGNOSIS — G8911 Acute pain due to trauma: Secondary | ICD-10-CM | POA: Diagnosis not present

## 2022-07-04 DIAGNOSIS — Z433 Encounter for attention to colostomy: Secondary | ICD-10-CM | POA: Diagnosis not present

## 2022-07-04 DIAGNOSIS — L02211 Cutaneous abscess of abdominal wall: Secondary | ICD-10-CM | POA: Diagnosis not present

## 2022-07-04 DIAGNOSIS — D72829 Elevated white blood cell count, unspecified: Secondary | ICD-10-CM | POA: Diagnosis not present

## 2022-07-05 ENCOUNTER — Other Ambulatory Visit: Payer: Self-pay

## 2022-07-05 MED ORDER — METHOCARBAMOL 750 MG PO TABS
750.0000 mg | ORAL_TABLET | Freq: Three times a day (TID) | ORAL | 0 refills | Status: DC | PRN
  Filled 2022-07-05: qty 30, 10d supply, fill #0

## 2022-07-05 MED ORDER — TRAZODONE HCL 100 MG PO TABS
100.0000 mg | ORAL_TABLET | Freq: Every evening | ORAL | 0 refills | Status: DC | PRN
  Filled 2022-07-05: qty 30, 30d supply, fill #0

## 2022-07-05 MED ORDER — CELECOXIB 100 MG PO CAPS
100.0000 mg | ORAL_CAPSULE | Freq: Every day | ORAL | 1 refills | Status: DC
  Filled 2022-07-05: qty 30, 30d supply, fill #0

## 2022-07-05 MED ORDER — AMPHETAMINE-DEXTROAMPHETAMINE 20 MG PO TABS
20.0000 mg | ORAL_TABLET | Freq: Two times a day (BID) | ORAL | 0 refills | Status: DC
  Filled 2022-07-05: qty 60, 30d supply, fill #0

## 2022-07-06 DIAGNOSIS — Z792 Long term (current) use of antibiotics: Secondary | ICD-10-CM | POA: Diagnosis not present

## 2022-07-06 DIAGNOSIS — G8911 Acute pain due to trauma: Secondary | ICD-10-CM | POA: Diagnosis not present

## 2022-07-06 DIAGNOSIS — T8149XA Infection following a procedure, other surgical site, initial encounter: Secondary | ICD-10-CM | POA: Diagnosis not present

## 2022-07-06 DIAGNOSIS — F909 Attention-deficit hyperactivity disorder, unspecified type: Secondary | ICD-10-CM | POA: Diagnosis not present

## 2022-07-06 DIAGNOSIS — L02211 Cutaneous abscess of abdominal wall: Secondary | ICD-10-CM | POA: Diagnosis not present

## 2022-07-06 DIAGNOSIS — T8189XA Other complications of procedures, not elsewhere classified, initial encounter: Secondary | ICD-10-CM | POA: Diagnosis not present

## 2022-07-06 DIAGNOSIS — I2699 Other pulmonary embolism without acute cor pulmonale: Secondary | ICD-10-CM | POA: Diagnosis not present

## 2022-07-06 DIAGNOSIS — K219 Gastro-esophageal reflux disease without esophagitis: Secondary | ICD-10-CM | POA: Diagnosis not present

## 2022-07-06 DIAGNOSIS — D72829 Elevated white blood cell count, unspecified: Secondary | ICD-10-CM | POA: Diagnosis not present

## 2022-07-06 DIAGNOSIS — Z791 Long term (current) use of non-steroidal anti-inflammatories (NSAID): Secondary | ICD-10-CM | POA: Diagnosis not present

## 2022-07-06 DIAGNOSIS — Z433 Encounter for attention to colostomy: Secondary | ICD-10-CM | POA: Diagnosis not present

## 2022-07-11 ENCOUNTER — Other Ambulatory Visit: Payer: Self-pay

## 2022-07-11 MED ORDER — OXYCODONE HCL 5 MG PO TABS
5.0000 mg | ORAL_TABLET | Freq: Four times a day (QID) | ORAL | 0 refills | Status: DC | PRN
  Filled 2022-07-11: qty 20, 3d supply, fill #0

## 2022-07-12 ENCOUNTER — Other Ambulatory Visit: Payer: Self-pay

## 2022-07-12 DIAGNOSIS — K219 Gastro-esophageal reflux disease without esophagitis: Secondary | ICD-10-CM | POA: Diagnosis not present

## 2022-07-12 DIAGNOSIS — F909 Attention-deficit hyperactivity disorder, unspecified type: Secondary | ICD-10-CM | POA: Diagnosis not present

## 2022-07-12 DIAGNOSIS — I2699 Other pulmonary embolism without acute cor pulmonale: Secondary | ICD-10-CM | POA: Diagnosis not present

## 2022-07-12 DIAGNOSIS — L02211 Cutaneous abscess of abdominal wall: Secondary | ICD-10-CM | POA: Diagnosis not present

## 2022-07-12 DIAGNOSIS — D72829 Elevated white blood cell count, unspecified: Secondary | ICD-10-CM | POA: Diagnosis not present

## 2022-07-12 DIAGNOSIS — Z433 Encounter for attention to colostomy: Secondary | ICD-10-CM | POA: Diagnosis not present

## 2022-07-12 DIAGNOSIS — Z791 Long term (current) use of non-steroidal anti-inflammatories (NSAID): Secondary | ICD-10-CM | POA: Diagnosis not present

## 2022-07-12 DIAGNOSIS — G8911 Acute pain due to trauma: Secondary | ICD-10-CM | POA: Diagnosis not present

## 2022-07-12 DIAGNOSIS — Z792 Long term (current) use of antibiotics: Secondary | ICD-10-CM | POA: Diagnosis not present

## 2022-07-12 DIAGNOSIS — T8149XA Infection following a procedure, other surgical site, initial encounter: Secondary | ICD-10-CM | POA: Diagnosis not present

## 2022-07-13 ENCOUNTER — Other Ambulatory Visit: Payer: Self-pay

## 2022-07-13 MED ORDER — TAMSULOSIN HCL 0.4 MG PO CAPS
0.4000 mg | ORAL_CAPSULE | Freq: Every day | ORAL | 0 refills | Status: DC
  Filled 2022-07-13: qty 15, 15d supply, fill #0

## 2022-07-18 ENCOUNTER — Other Ambulatory Visit: Payer: Self-pay

## 2022-07-18 MED ORDER — OXYCODONE HCL 5 MG PO TABS
10.0000 mg | ORAL_TABLET | Freq: Four times a day (QID) | ORAL | 0 refills | Status: DC | PRN
  Filled 2022-07-18: qty 30, 4d supply, fill #0

## 2022-07-20 ENCOUNTER — Other Ambulatory Visit: Payer: Self-pay

## 2022-07-25 ENCOUNTER — Other Ambulatory Visit: Payer: Self-pay

## 2022-07-25 DIAGNOSIS — M25552 Pain in left hip: Secondary | ICD-10-CM | POA: Diagnosis not present

## 2022-07-25 MED ORDER — DULOXETINE HCL 60 MG PO CPEP
60.0000 mg | ORAL_CAPSULE | Freq: Every day | ORAL | 11 refills | Status: DC
  Filled 2022-07-25: qty 30, 30d supply, fill #0
  Filled 2022-10-01: qty 30, 30d supply, fill #1

## 2022-07-25 MED ORDER — TRAZODONE HCL 100 MG PO TABS
200.0000 mg | ORAL_TABLET | Freq: Every evening | ORAL | 11 refills | Status: DC | PRN
  Filled 2022-07-25: qty 60, 30d supply, fill #0

## 2022-07-26 ENCOUNTER — Other Ambulatory Visit: Payer: Self-pay

## 2022-07-26 MED ORDER — PREGABALIN 50 MG PO CAPS
50.0000 mg | ORAL_CAPSULE | Freq: Two times a day (BID) | ORAL | 11 refills | Status: DC
  Filled 2022-07-26: qty 60, 30d supply, fill #0

## 2022-07-26 MED ORDER — TIZANIDINE HCL 2 MG PO TABS
2.0000 mg | ORAL_TABLET | Freq: Three times a day (TID) | ORAL | 0 refills | Status: DC | PRN
  Filled 2022-07-26: qty 42, 14d supply, fill #0

## 2022-07-31 DIAGNOSIS — Z933 Colostomy status: Secondary | ICD-10-CM | POA: Diagnosis not present

## 2022-08-02 ENCOUNTER — Other Ambulatory Visit: Payer: Self-pay

## 2022-08-02 MED ORDER — OXYCODONE HCL 5 MG PO TABS
10.0000 mg | ORAL_TABLET | Freq: Four times a day (QID) | ORAL | 0 refills | Status: DC | PRN
  Filled 2022-08-02: qty 20, 3d supply, fill #0

## 2022-08-02 MED ORDER — PREGABALIN 75 MG PO CAPS
75.0000 mg | ORAL_CAPSULE | Freq: Two times a day (BID) | ORAL | 11 refills | Status: DC
  Filled 2022-08-02: qty 60, 30d supply, fill #0
  Filled 2022-10-01: qty 60, 30d supply, fill #1

## 2022-08-03 ENCOUNTER — Other Ambulatory Visit: Payer: Self-pay

## 2022-08-06 ENCOUNTER — Other Ambulatory Visit: Payer: Self-pay

## 2022-08-06 MED ORDER — AMPHETAMINE-DEXTROAMPHETAMINE 20 MG PO TABS
20.0000 mg | ORAL_TABLET | Freq: Two times a day (BID) | ORAL | 0 refills | Status: DC
  Filled 2022-08-06: qty 60, 30d supply, fill #0

## 2022-08-07 DIAGNOSIS — Z933 Colostomy status: Secondary | ICD-10-CM | POA: Diagnosis not present

## 2022-08-08 ENCOUNTER — Other Ambulatory Visit: Payer: Self-pay

## 2022-08-08 MED ORDER — OXYCODONE HCL 5 MG PO TABS
10.0000 mg | ORAL_TABLET | ORAL | 0 refills | Status: DC | PRN
  Filled 2022-08-08: qty 30, 3d supply, fill #0

## 2022-08-14 ENCOUNTER — Other Ambulatory Visit: Payer: Self-pay

## 2022-08-14 MED ORDER — CYCLOBENZAPRINE HCL 5 MG PO TABS
5.0000 mg | ORAL_TABLET | Freq: Three times a day (TID) | ORAL | 0 refills | Status: DC | PRN
  Filled 2022-08-14: qty 30, 10d supply, fill #0

## 2022-08-14 MED ORDER — OXYCODONE HCL 5 MG PO TABS
10.0000 mg | ORAL_TABLET | ORAL | 0 refills | Status: DC | PRN
  Filled 2022-08-14: qty 30, 3d supply, fill #0

## 2022-08-14 MED ORDER — CELECOXIB 100 MG PO CAPS
100.0000 mg | ORAL_CAPSULE | Freq: Every day | ORAL | 1 refills | Status: DC
  Filled 2022-08-14: qty 30, 30d supply, fill #0

## 2022-08-17 DIAGNOSIS — Z9049 Acquired absence of other specified parts of digestive tract: Secondary | ICD-10-CM | POA: Diagnosis not present

## 2022-08-17 DIAGNOSIS — R079 Chest pain, unspecified: Secondary | ICD-10-CM | POA: Diagnosis not present

## 2022-08-17 DIAGNOSIS — R200 Anesthesia of skin: Secondary | ICD-10-CM | POA: Diagnosis not present

## 2022-08-17 DIAGNOSIS — Z5181 Encounter for therapeutic drug level monitoring: Secondary | ICD-10-CM | POA: Diagnosis not present

## 2022-08-17 DIAGNOSIS — R42 Dizziness and giddiness: Secondary | ICD-10-CM | POA: Diagnosis not present

## 2022-08-17 DIAGNOSIS — R000 Tachycardia, unspecified: Secondary | ICD-10-CM | POA: Diagnosis not present

## 2022-08-17 DIAGNOSIS — Z87891 Personal history of nicotine dependence: Secondary | ICD-10-CM | POA: Diagnosis not present

## 2022-08-17 DIAGNOSIS — Z79899 Other long term (current) drug therapy: Secondary | ICD-10-CM | POA: Diagnosis not present

## 2022-08-17 DIAGNOSIS — R7989 Other specified abnormal findings of blood chemistry: Secondary | ICD-10-CM | POA: Diagnosis not present

## 2022-08-18 DIAGNOSIS — R200 Anesthesia of skin: Secondary | ICD-10-CM | POA: Diagnosis not present

## 2022-08-18 DIAGNOSIS — R079 Chest pain, unspecified: Secondary | ICD-10-CM | POA: Diagnosis not present

## 2022-08-18 DIAGNOSIS — R000 Tachycardia, unspecified: Secondary | ICD-10-CM | POA: Diagnosis not present

## 2022-08-18 DIAGNOSIS — Z87891 Personal history of nicotine dependence: Secondary | ICD-10-CM | POA: Diagnosis not present

## 2022-08-20 ENCOUNTER — Other Ambulatory Visit: Payer: Self-pay

## 2022-08-20 MED ORDER — OXYCODONE HCL 5 MG PO TABS
15.0000 mg | ORAL_TABLET | Freq: Four times a day (QID) | ORAL | 0 refills | Status: DC | PRN
  Filled 2022-08-20: qty 45, 4d supply, fill #0

## 2022-08-23 ENCOUNTER — Other Ambulatory Visit: Payer: Self-pay

## 2022-08-23 MED ORDER — OXYCODONE HCL 5 MG PO TABS
5.0000 mg | ORAL_TABLET | Freq: Four times a day (QID) | ORAL | 0 refills | Status: DC | PRN
  Filled 2022-08-23: qty 50, 13d supply, fill #0

## 2022-09-03 ENCOUNTER — Other Ambulatory Visit: Payer: Self-pay

## 2022-09-03 MED ORDER — OXYCODONE HCL 5 MG PO TABS
15.0000 mg | ORAL_TABLET | Freq: Four times a day (QID) | ORAL | 0 refills | Status: DC | PRN
  Filled 2022-09-03: qty 39, 2d supply, fill #0
  Filled 2022-09-04: qty 11, 2d supply, fill #0

## 2022-09-04 ENCOUNTER — Other Ambulatory Visit: Payer: Self-pay

## 2022-09-05 DIAGNOSIS — Z09 Encounter for follow-up examination after completed treatment for conditions other than malignant neoplasm: Secondary | ICD-10-CM | POA: Diagnosis not present

## 2022-09-12 DIAGNOSIS — Z933 Colostomy status: Secondary | ICD-10-CM | POA: Diagnosis not present

## 2022-09-19 ENCOUNTER — Other Ambulatory Visit: Payer: Self-pay

## 2022-09-19 MED ORDER — PEG-3350/ELECTROLYTES 236 G PO SOLR
4000.0000 mL | Freq: Once | ORAL | 0 refills | Status: AC
  Filled 2022-09-19: qty 4000, 1d supply, fill #0

## 2022-09-21 DIAGNOSIS — Z91048 Other nonmedicinal substance allergy status: Secondary | ICD-10-CM | POA: Diagnosis not present

## 2022-09-21 DIAGNOSIS — Z7901 Long term (current) use of anticoagulants: Secondary | ICD-10-CM | POA: Diagnosis not present

## 2022-09-21 DIAGNOSIS — Z933 Colostomy status: Secondary | ICD-10-CM | POA: Diagnosis not present

## 2022-09-21 DIAGNOSIS — K219 Gastro-esophageal reflux disease without esophagitis: Secondary | ICD-10-CM | POA: Diagnosis not present

## 2022-09-21 DIAGNOSIS — Z885 Allergy status to narcotic agent status: Secondary | ICD-10-CM | POA: Diagnosis not present

## 2022-09-21 DIAGNOSIS — Z79899 Other long term (current) drug therapy: Secondary | ICD-10-CM | POA: Diagnosis not present

## 2022-09-21 DIAGNOSIS — M795 Residual foreign body in soft tissue: Secondary | ICD-10-CM | POA: Diagnosis not present

## 2022-09-21 DIAGNOSIS — Z433 Encounter for attention to colostomy: Secondary | ICD-10-CM | POA: Diagnosis not present

## 2022-09-21 DIAGNOSIS — Z87891 Personal history of nicotine dependence: Secondary | ICD-10-CM | POA: Diagnosis not present

## 2022-09-25 DIAGNOSIS — K439 Ventral hernia without obstruction or gangrene: Secondary | ICD-10-CM | POA: Insufficient documentation

## 2022-09-26 ENCOUNTER — Other Ambulatory Visit: Payer: Self-pay

## 2022-09-26 MED ORDER — ACETAMINOPHEN 325 MG PO TABS
650.0000 mg | ORAL_TABLET | Freq: Four times a day (QID) | ORAL | 0 refills | Status: DC
  Filled 2022-12-05: qty 30, 4d supply, fill #0

## 2022-09-26 MED ORDER — DOCUSATE SODIUM 100 MG PO CAPS
100.0000 mg | ORAL_CAPSULE | Freq: Two times a day (BID) | ORAL | 0 refills | Status: DC
  Filled 2022-09-26: qty 20, 10d supply, fill #0

## 2022-09-26 MED ORDER — AMOXICILLIN-POT CLAVULANATE 875-125 MG PO TABS
1.0000 | ORAL_TABLET | Freq: Two times a day (BID) | ORAL | 0 refills | Status: DC
  Filled 2022-09-26: qty 14, 7d supply, fill #0

## 2022-09-26 MED ORDER — BACITRACIN ZINC 500 UNIT/GM EX OINT
1.0000 | TOPICAL_OINTMENT | Freq: Every day | CUTANEOUS | 0 refills | Status: DC
  Filled 2022-12-05: qty 28, 31d supply, fill #0

## 2022-09-26 MED ORDER — OXYCODONE HCL 5 MG PO TABS
15.0000 mg | ORAL_TABLET | ORAL | 0 refills | Status: DC | PRN
  Filled 2022-09-26: qty 2, 1d supply, fill #0
  Filled 2022-09-26: qty 28, 1d supply, fill #0

## 2022-09-26 MED ORDER — POLYETHYLENE GLYCOL 3350 17 GM/SCOOP PO POWD
17.0000 g | Freq: Every day | ORAL | 0 refills | Status: DC
  Filled 2022-09-26: qty 238, 14d supply, fill #0

## 2022-09-26 MED ORDER — BACLOFEN 10 MG PO TABS
5.0000 mg | ORAL_TABLET | Freq: Two times a day (BID) | ORAL | 0 refills | Status: DC
  Filled 2022-09-26: qty 30, 30d supply, fill #0

## 2022-09-26 MED ORDER — ONDANSETRON 4 MG PO TBDP
4.0000 mg | ORAL_TABLET | Freq: Three times a day (TID) | ORAL | 0 refills | Status: DC | PRN
  Filled 2022-09-26: qty 10, 4d supply, fill #0

## 2022-09-26 MED ORDER — CELECOXIB 100 MG PO CAPS
100.0000 mg | ORAL_CAPSULE | Freq: Two times a day (BID) | ORAL | 0 refills | Status: DC
  Filled 2022-09-26: qty 60, 30d supply, fill #0

## 2022-10-01 ENCOUNTER — Other Ambulatory Visit: Payer: Self-pay

## 2022-10-02 ENCOUNTER — Other Ambulatory Visit: Payer: Self-pay

## 2022-10-02 MED ORDER — AMPHETAMINE-DEXTROAMPHETAMINE 20 MG PO TABS
20.0000 mg | ORAL_TABLET | Freq: Two times a day (BID) | ORAL | 0 refills | Status: DC
  Filled 2022-10-02: qty 60, 30d supply, fill #0

## 2022-10-03 ENCOUNTER — Other Ambulatory Visit: Payer: Self-pay

## 2022-10-03 DIAGNOSIS — Z09 Encounter for follow-up examination after completed treatment for conditions other than malignant neoplasm: Secondary | ICD-10-CM | POA: Diagnosis not present

## 2022-10-03 DIAGNOSIS — K458 Other specified abdominal hernia without obstruction or gangrene: Secondary | ICD-10-CM | POA: Diagnosis not present

## 2022-10-03 DIAGNOSIS — Z9889 Other specified postprocedural states: Secondary | ICD-10-CM | POA: Diagnosis not present

## 2022-10-03 MED ORDER — ZOLPIDEM TARTRATE 5 MG PO TABS
5.0000 mg | ORAL_TABLET | Freq: Every evening | ORAL | 0 refills | Status: DC | PRN
  Filled 2022-10-03: qty 30, 30d supply, fill #0

## 2022-10-10 ENCOUNTER — Other Ambulatory Visit: Payer: Self-pay

## 2022-10-10 MED ORDER — OXYCODONE HCL 5 MG PO TABS
5.0000 mg | ORAL_TABLET | ORAL | 0 refills | Status: DC | PRN
  Filled 2022-10-10: qty 20, 4d supply, fill #0

## 2022-10-14 DIAGNOSIS — Z87828 Personal history of other (healed) physical injury and trauma: Secondary | ICD-10-CM | POA: Insufficient documentation

## 2022-10-14 NOTE — Patient Instructions (Signed)

## 2022-10-16 ENCOUNTER — Other Ambulatory Visit (HOSPITAL_COMMUNITY): Payer: Self-pay

## 2022-10-19 ENCOUNTER — Encounter: Payer: Self-pay | Admitting: Nurse Practitioner

## 2022-10-19 ENCOUNTER — Other Ambulatory Visit: Payer: Self-pay

## 2022-10-19 ENCOUNTER — Ambulatory Visit: Payer: 59 | Admitting: Nurse Practitioner

## 2022-10-19 VITALS — BP 119/77 | HR 80 | Temp 97.7°F | Ht 69.0 in | Wt 220.0 lb

## 2022-10-19 DIAGNOSIS — F988 Other specified behavioral and emotional disorders with onset usually occurring in childhood and adolescence: Secondary | ICD-10-CM | POA: Diagnosis not present

## 2022-10-19 DIAGNOSIS — Z87828 Personal history of other (healed) physical injury and trauma: Secondary | ICD-10-CM

## 2022-10-19 DIAGNOSIS — F5104 Psychophysiologic insomnia: Secondary | ICD-10-CM | POA: Insufficient documentation

## 2022-10-19 DIAGNOSIS — F4310 Post-traumatic stress disorder, unspecified: Secondary | ICD-10-CM | POA: Diagnosis not present

## 2022-10-19 DIAGNOSIS — Z7689 Persons encountering health services in other specified circumstances: Secondary | ICD-10-CM

## 2022-10-19 DIAGNOSIS — F330 Major depressive disorder, recurrent, mild: Secondary | ICD-10-CM

## 2022-10-19 DIAGNOSIS — F17210 Nicotine dependence, cigarettes, uncomplicated: Secondary | ICD-10-CM

## 2022-10-19 MED ORDER — PAROXETINE HCL 10 MG PO TABS
10.0000 mg | ORAL_TABLET | Freq: Every day | ORAL | 1 refills | Status: DC
  Filled 2022-10-19: qty 60, 60d supply, fill #0

## 2022-10-19 MED ORDER — ALBUTEROL SULFATE HFA 108 (90 BASE) MCG/ACT IN AERS
2.0000 | INHALATION_SPRAY | Freq: Four times a day (QID) | RESPIRATORY_TRACT | 2 refills | Status: DC | PRN
  Filled 2022-10-19: qty 6.7, 25d supply, fill #0
  Filled 2023-02-26: qty 6.7, 25d supply, fill #1

## 2022-10-19 MED ORDER — PRAZOSIN HCL 1 MG PO CAPS
1.0000 mg | ORAL_CAPSULE | Freq: Every day | ORAL | 1 refills | Status: DC
  Filled 2022-10-19: qty 60, 60d supply, fill #0
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
## 2022-10-19 NOTE — Assessment & Plan Note (Signed)
Chronic, ongoing since middle school.  Will continue Adderall 20 MG BID.  Educated him on office rules for controlled substances.  He is agreeable to signing a controlled substance agreement, performing yearly UDS, and every 3 month visits.  Will plan on return in 2 weeks to sign agreement + get urine, then send in refills.
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
## 2022-10-19 NOTE — Assessment & Plan Note (Signed)
Quit smoking months back, recommend continued cessation.
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
## 2022-10-25 ENCOUNTER — Ambulatory Visit
Payer: 59 | Attending: Student in an Organized Health Care Education/Training Program | Admitting: Student in an Organized Health Care Education/Training Program

## 2022-10-25 ENCOUNTER — Encounter: Payer: Self-pay | Admitting: Student in an Organized Health Care Education/Training Program

## 2022-10-25 VITALS — BP 145/84 | HR 85 | Temp 98.4°F | Resp 17 | Ht 70.0 in | Wt 231.2 lb

## 2022-10-25 DIAGNOSIS — F4310 Post-traumatic stress disorder, unspecified: Secondary | ICD-10-CM | POA: Insufficient documentation

## 2022-10-25 DIAGNOSIS — R1032 Left lower quadrant pain: Secondary | ICD-10-CM | POA: Diagnosis not present

## 2022-10-25 DIAGNOSIS — G5792 Unspecified mononeuropathy of left lower limb: Secondary | ICD-10-CM | POA: Insufficient documentation

## 2022-10-25 DIAGNOSIS — M792 Neuralgia and neuritis, unspecified: Secondary | ICD-10-CM | POA: Diagnosis not present

## 2022-10-25 DIAGNOSIS — G894 Chronic pain syndrome: Secondary | ICD-10-CM | POA: Insufficient documentation

## 2022-10-25 NOTE — Progress Notes (Signed)
Safety precautions to be maintained throughout the outpatient stay will include: orient to surroundings, keep bed in low position, maintain call bell within reach at all times, provide assistance with transfer out of bed and ambulation.  

## 2022-10-25 NOTE — Patient Instructions (Signed)
Procedure instructions  Do not eat or drink fluids (other than water) for 6 hours before your procedure  No water for 2 hours before your procedure  Take your blood pressure medicine with a sip of water  Arrive 30 minutes before your appointment  Carefully read the "Preparing for your procedure" detailed instructions  If you have questions call us at [phone]  _____________________________________________________________________    ______________________________________________________________________  Preparing for your procedure  Appointments: If you think you may not be able to keep your appointment, call 24-48 hours in advance to cancel. We need time to make it available to others.  During your procedure appointment there will be: No Prescription Refills. No disability issues to discussed. No medication changes or discussions.  Instructions: Food intake: Avoid eating anything solid for at least 8 hours prior to your procedure. Clear liquid intake: You may take clear liquids such as water up to 2 hours prior to your procedure. (No carbonated drinks. No soda.) Transportation: Unless otherwise stated by your physician, bring a driver. Morning Medicines: Except for blood thinners, take all of your other morning medications with a sip of water. Make sure to take your heart and blood pressure medicines. If your blood pressure's lower number is above 100, the case will be rescheduled. Blood thinners: Make sure to stop your blood thinners as instructed.  If you take a blood thinner, but were not instructed to stop it, call our office [phone] and ask to talk to a nurse. Not stopping a blood thinner prior to certain procedures could lead to serious complications. Diabetics on insulin: Notify the staff so that you can be scheduled 1st case in the morning. If your diabetes requires high dose insulin, take only  of your normal insulin dose the morning of the procedure and  notify the staff that you have done so. Preventing infections: Shower with an antibacterial soap the morning of your procedure.  Build-up your immune system: Take 1000 mg of Vitamin C with every meal (3 times a day) the day prior to your procedure. Antibiotics: Inform the nursing staff if you are taking any antibiotics or if you have any conditions that may require antibiotics prior to procedures. (Example: recent joint implants)   Pregnancy: If you are pregnant make sure to notify the nursing staff. Not doing so may result in injury to the fetus, including death.  Sickness: If you have a cold, fever, or any active infections, call and cancel or reschedule your procedure. Receiving steroids while having an infection may result in complications. Arrival: You must be in the facility at least 30 minutes prior to your scheduled procedure. Tardiness: Your scheduled time is also the cutoff time. If you do not arrive at least 15 minutes prior to your procedure, you will be rescheduled.  Children: Do not bring any children with you. Make arrangements to keep them home. Dress appropriately: There is always a possibility that your clothing may get soiled. Avoid long dresses. Valuables: Do not bring any jewelry or valuables.  Reasons to call and reschedule or cancel your procedure: (Following these recommendations will minimize the risk of a serious complication.) Surgeries: Avoid having procedures within 2 weeks of any surgery. (Avoid for 2 weeks before or after any surgery). Flu Shots: Avoid having procedures within 2 weeks of a flu shots or . (Avoid for 2 weeks before or after immunizations). Barium: Avoid having a procedure within 7-10 days after having had a radiological study involving the use of radiological contrast. (  Myelograms, Barium swallow or enema study). Heart attacks: Avoid any elective procedures or surgeries for the initial 6 months after a "Myocardial Infarction" (Heart Attack). Blood  thinners: It is imperative that you stop these medications before procedures. Let us know if you if you take any blood thinner.  Infection: Avoid procedures during or within two weeks of an infection (including chest colds or gastrointestinal problems). Symptoms associated with infections include: Localized redness, fever, chills, night sweats or profuse sweating, burning sensation when voiding, cough, congestion, stuffiness, runny nose, sore throat, diarrhea, nausea, vomiting, cold or Flu symptoms, recent or current infections. It is specially important if the infection is over the area that we intend to treat. Heart and lung problems: Symptoms that may suggest an active cardiopulmonary problem include: cough, chest pain, breathing difficulties or shortness of breath, dizziness, ankle swelling, uncontrolled high or unusually low blood pressure, and/or palpitations. If you are experiencing any of these symptoms, cancel your procedure and contact your primary care physician for an evaluation.  Remember:  Regular Business hours are:  Monday to Thursday 8:00 AM to 4:00 PM  Provider's Schedule: Francisco Naveira, MD:  Procedure days: Tuesday and Thursday 7:30 AM to 4:00 PM  Bilal Lateef, MD:  Procedure days: Monday and Wednesday 7:30 AM to 4:00 PM 
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
## 2022-10-28 NOTE — Patient Instructions (Signed)
Be Involved in Caring For Your Health:  Taking Medications When medications are taken as directed, they can greatly improve your health. But if they are not taken as prescribed, they may not work. In some cases, not taking them correctly can be harmful. To help ensure your treatment remains effective and safe, understand your medications and how to take them. Bring your medications to each visit for review by your provider.  Your lab results, notes, and after visit summary will be available on My Chart. We strongly encourage you to use this feature. If lab results are abnormal the clinic will contact you with the appropriate steps. If the clinic does not contact you assume the results are satisfactory. You can always view your results on My Chart. If you have questions regarding your health or results, please contact the clinic during office hours. You can also ask questions on My Chart.  We at [facility] are grateful that you chose [place] to provide your care. We strive to provide evidence-based and compassionate care and are always looking for feedback. If you get a survey from the clinic please complete this so we can hear your opinions.  Chronic Pain, Adult Chronic pain is a type of pain that lasts or keeps coming back for at least 3-6 months. You may have headaches, pain in the abdomen, or pain in other areas of the body. Chronic pain may be related to an illness, injury, or a health condition. Sometimes, the cause of chronic pain is not known. Chronic pain can make it hard for you to do daily activities. If it is not treated, chronic pain can lead to anxiety and depression. Treatment depends on the cause of your pain and how severe it is. You may need to work with a pain specialist to come up with a treatment plan. Many people benefit from two or more types of treatment to control their pain. Follow these instructions at home: Treatment plan Follow your treatment plan as told by your  health care provider. This may include: Gentle, regular exercise. Eating a healthy diet that includes foods such as vegetables, fruits, fish, and lean meats. Mental health therapy (cognitive or behavioral therapy) that changes the way you think or act in response to the pain. This may help improve how you feel. Doing physical therapy exercises to improve movement and strength. Meditation, yoga, acupuncture, or massage therapy. Using the oils from plants in your environment or on your skin (aromatherapy). Other treatments may include: Over-the-counter or prescription medicines. Color, light, or sound therapy. Local electrical stimulation. The electrical pulses help to relieve pain by temporarily stopping the nerve impulses that cause you to feel pain. Injections. These deliver numbing or pain-relieving medicines into the spine or the area of pain.  Medicines Take over-the-counter and prescription medicines only as told by your health care provider. Ask your health care provider if the medicine prescribed to you: Requires you to avoid driving or using machinery. Can cause constipation. You may need to take these actions to prevent or treat constipation: Drink enough fluid to keep your urine pale yellow. Take over-the-counter or prescription medicines. Eat foods that are high in fiber, such as beans, whole grains, and fresh fruits and vegetables. Limit foods that are high in fat and processed sugars, such as fried or sweet foods. Lifestyle  Ask your health care provider whether you should keep a pain diary. Your health care provider will tell you what information to write in the diary. This may  include: When you have pain. What the pain feels like. How medicines and other behaviors or treatments help to reduce the pain. Consider talking with a mental health care provider about how to help manage chronic pain. Consider joining a chronic pain support group. Try to control or lower your  stress levels. Talk with your health care provider about ways to do this. General instructions Learn as much as you can about how to manage your chronic pain. Ask your health care provider if an intensive pain rehabilitation program or a chronic pain specialist would be helpful. Check your pain level as told by your health care provider. Ask your health care provider if you should use a pain scale. Contact a health care provider if: Your pain is not controlled with treatment. You have new pain. You have side effects from pain medicine. You feel weak or you have trouble doing your normal activities. You have trouble sleeping or you develop confusion. You lose feeling or have numbness in your body. You lose control of your bowels or bladder. Get help right away if: Your pain suddenly gets much worse. You develop chest pain. You have trouble breathing or shortness of breath. You faint, or another person sees you faint. These symptoms may be an emergency. Get help right away. Call 911. Do not wait to see if the symptoms will go away. Do not drive yourself to the hospital. Also, get help right away if: You have thoughts about hurting yourself or others. Take one of these steps if you feel like you may hurt yourself or others, or have thoughts about taking your own life: Go to your nearest emergency room. Call 911. Call the National Suicide Prevention Lifeline at (661) 432-9907 or 988. This is open 24 hours a day. Text the Crisis Text Line at 7540747700. This information is not intended to replace advice given to you by your health care provider. Make sure you discuss any questions you have with your health care provider. Document Revised: 08/23/2021 Document Reviewed: 07/26/2021 Elsevier Patient Education  2024 [employer].

## 2022-10-29 ENCOUNTER — Ambulatory Visit (INDEPENDENT_AMBULATORY_CARE_PROVIDER_SITE_OTHER): Payer: 59 | Admitting: Nurse Practitioner

## 2022-10-29 ENCOUNTER — Encounter: Payer: Self-pay | Admitting: Nurse Practitioner

## 2022-10-29 ENCOUNTER — Other Ambulatory Visit: Payer: Self-pay

## 2022-10-29 VITALS — BP 134/86 | HR 94 | Temp 98.6°F | Ht 70.0 in | Wt 229.4 lb

## 2022-10-29 DIAGNOSIS — F330 Major depressive disorder, recurrent, mild: Secondary | ICD-10-CM

## 2022-10-29 DIAGNOSIS — F5104 Psychophysiologic insomnia: Secondary | ICD-10-CM | POA: Diagnosis not present

## 2022-10-29 DIAGNOSIS — F4310 Post-traumatic stress disorder, unspecified: Secondary | ICD-10-CM

## 2022-10-29 DIAGNOSIS — G894 Chronic pain syndrome: Secondary | ICD-10-CM | POA: Diagnosis not present

## 2022-10-29 DIAGNOSIS — Z79899 Other long term (current) drug therapy: Secondary | ICD-10-CM | POA: Insufficient documentation

## 2022-10-29 DIAGNOSIS — F988 Other specified behavioral and emotional disorders with onset usually occurring in childhood and adolescence: Secondary | ICD-10-CM

## 2022-10-29 MED ORDER — TRAZODONE HCL 100 MG PO TABS
100.0000 mg | ORAL_TABLET | Freq: Every evening | ORAL | 4 refills | Status: AC
Start: 2022-10-29 — End: ?
  Filled 2022-10-29: qty 90, 45d supply, fill #0

## 2022-10-29 MED ORDER — AMPHETAMINE-DEXTROAMPHETAMINE 20 MG PO TABS: 20.0000 mg | ORAL_TABLET | Freq: Two times a day (BID) | ORAL | 0 refills | Status: DC

## 2022-10-29 MED ORDER — PRAZOSIN HCL 1 MG PO CAPS
1.0000 mg | ORAL_CAPSULE | Freq: Every day | ORAL | 3 refills | Status: DC
  Filled 2022-10-29 – 2022-12-05 (×2): qty 90, 90d supply, fill #0

## 2022-10-29 MED ORDER — AMPHETAMINE-DEXTROAMPHETAMINE 20 MG PO TABS
20.0000 mg | ORAL_TABLET | Freq: Two times a day (BID) | ORAL | 0 refills | Status: DC
  Filled 2022-10-31: qty 60, 30d supply, fill #0

## 2022-10-29 MED ORDER — AMPHETAMINE-DEXTROAMPHETAMINE 20 MG PO TABS
20.0000 mg | ORAL_TABLET | Freq: Two times a day (BID) | ORAL | 0 refills | Status: DC
  Filled 2022-12-05: qty 60, 30d supply, fill #0
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
## 2022-10-29 NOTE — Assessment & Plan Note (Signed)
Chronic, ongoing since middle school.  Will continue Adderall 20 MG BID, taken over by this PCP.  Educated him on office rules for controlled substances.  He is agreeable to signing a controlled substance agreement (done today), performing yearly UDS (done today and due next 10/29/23), and every 3 month visits.  Refills sent in today -- 3 refills predated.
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
## 2022-10-29 NOTE — Assessment & Plan Note (Signed)
Chronic, is following with pain clinic and they are working on plan of care for his pain.  Order for hernia belt sent home with patient.
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
## 2022-10-29 NOTE — Assessment & Plan Note (Signed)
Signed with patient and reviewed on 10/29/22.

## 2022-10-30 ENCOUNTER — Other Ambulatory Visit: Payer: Self-pay

## 2022-10-30 DIAGNOSIS — F988 Other specified behavioral and emotional disorders with onset usually occurring in childhood and adolescence: Secondary | ICD-10-CM | POA: Diagnosis not present

## 2022-10-31 ENCOUNTER — Other Ambulatory Visit: Payer: Self-pay

## 2022-11-01 LAB — DRUG SCREEN 764883 11+OXYCO+ALC+CRT-BUND
Amphetamines, Urine: NEGATIVE ng/mL
BENZODIAZ UR QL: NEGATIVE ng/mL
Barbiturate: NEGATIVE ng/mL
Cannabinoid Quant, Ur: NEGATIVE ng/mL
Cocaine (Metabolite): NEGATIVE ng/mL
Creatinine: 11.1 mg/dL — ABNORMAL LOW (ref 20.0–300.0)
Ethanol: NEGATIVE %
Meperidine: NEGATIVE ng/mL
Methadone Screen, Urine: NEGATIVE ng/mL
OPIATE SCREEN URINE: NEGATIVE ng/mL
Oxycodone/Oxymorphone, Urine: NEGATIVE ng/mL
Phencyclidine: NEGATIVE ng/mL
Propoxyphene: NEGATIVE ng/mL
Tramadol: NEGATIVE ng/mL
pH, Urine: 6 (ref 4.5–8.9)

## 2022-11-01 LAB — SPECIFIC GRAVITY: Specific Gravity: 1.0014

## 2022-11-26 ENCOUNTER — Encounter: Payer: Self-pay | Admitting: Student in an Organized Health Care Education/Training Program

## 2022-11-26 ENCOUNTER — Other Ambulatory Visit: Payer: Self-pay

## 2022-11-26 ENCOUNTER — Ambulatory Visit
Payer: 59 | Attending: Student in an Organized Health Care Education/Training Program | Admitting: Student in an Organized Health Care Education/Training Program

## 2022-11-26 DIAGNOSIS — R1032 Left lower quadrant pain: Secondary | ICD-10-CM | POA: Diagnosis not present

## 2022-11-26 DIAGNOSIS — M792 Neuralgia and neuritis, unspecified: Secondary | ICD-10-CM | POA: Diagnosis not present

## 2022-11-26 DIAGNOSIS — G5792 Unspecified mononeuropathy of left lower limb: Secondary | ICD-10-CM | POA: Diagnosis not present

## 2022-11-26 MED ORDER — OXYCODONE HCL 10 MG PO TABS
5.0000 mg | ORAL_TABLET | Freq: Every evening | ORAL | 0 refills | Status: DC | PRN
  Filled 2022-11-26: qty 15, 15d supply, fill #0

## 2022-11-26 MED ORDER — DEXAMETHASONE SODIUM PHOSPHATE 10 MG/ML IJ SOLN
10.0000 mg | Freq: Once | INTRAMUSCULAR | Status: AC
  Administered 2022-11-26: 10 mg
  Filled 2022-11-26: qty 1

## 2022-11-26 MED ORDER — LIDOCAINE HCL 2 % IJ SOLN
20.0000 mL | Freq: Once | INTRAMUSCULAR | Status: AC
  Administered 2022-11-26: 100 mg
  Filled 2022-11-26: qty 40

## 2022-11-26 MED ORDER — DIAZEPAM 5 MG PO TABS
ORAL_TABLET | ORAL | Status: AC
  Filled 2022-11-26: qty 2

## 2022-11-26 MED ORDER — ROPIVACAINE HCL 2 MG/ML IJ SOLN
9.0000 mL | Freq: Once | INTRAMUSCULAR | Status: AC
  Administered 2022-11-26: 9 mL via PERINEURAL
  Filled 2022-11-26: qty 20

## 2022-11-26 NOTE — Patient Instructions (Signed)
Pain Management Discharge Instructions  General Discharge Instructions :  If you need to reach your doctor call: Monday-Friday 8:00 am - 4:00 pm at [phone] or toll free [phone].  After clinic hours [phone] to have operator reach doctor.  Bring all of your medication bottles to all your appointments in the pain clinic.  To cancel or reschedule your appointment with Pain Management please remember to call 24 hours in advance to avoid a fee.  Refer to the educational materials which you have been given on: General Risks, I had my Procedure. Discharge Instructions, Post Sedation.  Post Procedure Instructions:  The drugs you were given will stay in your system until tomorrow, so for the next 24 hours you should not drive, make any legal decisions or drink any alcoholic beverages.  You may eat anything you prefer, but it is better to start with liquids then soups and crackers, and gradually work up to solid foods.  Please notify your doctor immediately if you have any unusual bleeding, trouble breathing or pain that is not related to your normal pain.  Depending on the type of procedure that was done, some parts of your body may feel week and/or numb.  This usually clears up by tonight or the next day.  Walk with the use of an assistive device or accompanied by an adult for the 24 hours.  You may use ice on the affected area for the first 24 hours.  Put ice in a Ziploc bag and cover with a towel and place against area 15 minutes on 15 minutes off.  You may switch to heat after 24 hours.Selective Nerve Root Block Patient Information  Description: Specific nerve roots exit the spinal canal and these nerves can be compressed and inflamed by a bulging disc and bone spurs.  By injecting steroids on the nerve root, we can potentially decrease the inflammation surrounding these nerves, which often leads to decreased pain.  Also, by injecting local anesthesia on the nerve root, this can  provide us helpful information to give to your referring doctor if it decreases your pain.  Selective nerve root blocks can be done along the spine from the neck to the low back depending on the location of your pain.   After numbing the skin with local anesthesia, a small needle is passed to the nerve root and the position of the needle is verified using x-ray pictures.  After the needle is in correct position, we then deposit the medication.  You may experience a pressure sensation while this is being done.  The entire block usually lasts less than 15 minutes.  Conditions that may be treated with selective nerve root blocks: Low back and leg pain Spinal stenosis Diagnostic block prior to potential surgery Neck and arm pain Post laminectomy syndrome  Preparation for the injection:  Do not eat any solid food or dairy products within 8 hours of your appointment. You may drink clear liquids up to 3 hours before an appointment.  Clear liquids include water, black coffee, juice or soda.  No milk or cream please. You may take your regular medications, including pain medications, with a sip of water before your appointment.  Diabetics should hold regular insulin (if taken separately) and take 1/2 normal NPH dose the morning of the procedure.  Carry some sugar containing items with you to your appointment. A driver must accompany you and be prepared to drive you home after your procedure. Bring all your current medications with you. An IV   may be inserted and sedation may be given at the discretion of the physician. A blood pressure cuff, EKG, and other monitors will often be applied during the procedure.  Some patients may need to have extra oxygen administered for a short period. You will be asked to provide medical information, including allergies, prior to the procedure.  We must know immediately if you are taking blood  Thinners (like Coumadin) or if you are allergic to IV iodine contrast  (dye).  Possible side-effects: All are usually temporary Bleeding from needle site Light headedness Numbness and tingling Decreased blood pressure Weakness in arms/legs Pressure sensation in back/neck Pain at injection site (several days)  Possible complications: All are extremely rare Infection Nerve injury Spinal headache (a headache wore with upright position)  Call if you experience: Fever/chills associated with headache or increased back/neck pain Headache worsened by an upright position New onset weakness or numbness of an extremity below the injection site Hives or difficulty breathing (go to the emergency room) Inflammation or drainage at the injection site(s) Severe back/neck pain greater than usual New symptoms which are concerning to you  Please note:  Although the local anesthetic injected can often make your back or neck feel good for several hours after the injection the pain will likely return.  It takes 3-5 days for steroids to work on the nerve root. You may not notice any pain relief for at least one week.  If effective, we will often do a series of 3 injections spaced 3-6 weeks apart to maximally decrease your pain.    If you have any questions, please call [phone]  [facility] 

## 2022-11-26 NOTE — Progress Notes (Signed)
PROVIDER NOTE: Interpretation of information contained herein should be left to medically-trained personnel. Specific patient instructions are provided elsewhere under "Patient Instructions" section of medical record. This document was created in part using STT-dictation technology, any transcriptional errors that may result from this process are unintentional.  Patient: Darren Miller Type: Established DOB: 1985-12-19 MRN: [id number] PCP: Marjie Skiff, NP  Service: Procedure DOS: 11/26/2022 Setting: Ambulatory Location: Ambulatory outpatient facility Delivery: Face-to-face Provider: Edward Jolly, MD Specialty: Interventional Pain Management Specialty designation: 09 Location: Outpatient facility Ref. Prov.: Edward Jolly, MD       Interventional Therapy   Primary Reason for Visit: Interventional Pain Management Treatment. CC: Pain (Left waistling into groin)    Procedure:          Anesthesia, Analgesia, Anxiolysis:  Type:  Ilioinguinal  Nerve Block          Primary Purpose: Diagnostic Region: ASIS, Groin Region Target Area: Lateral one third of the line between the left ASIS and left umbilicus Approach: Anterior approach Laterality: Left  Anesthesia: Local (1-2% Lidocaine)  Anxiolysis: Oral Valium 10 mg Guidance: Ultrasound           Position: Supine   1. Ilioinguinal neuralgia of left side   2. Abdominal pain, left lower quadrant   3. Neuropathic pain    NAS-11 Pain score:   Pre-procedure: 7 /10   Post-procedure: 4 /10     H&P (Pre-op Assessment):  Darren Miller is a 37 y.o. (year old), male patient, seen today for interventional treatment. He  has a past surgical history that includes Appendectomy; Colonoscopy with propofol (N/A, 11/19/2017); Esophagogastroduodenoscopy (egd) with propofol (N/A, 11/19/2017); and Knee arthroscopy with meniscal repair (Right, 07/16/2019). Darren Miller has a current medication list which includes the following prescription(s): acetaminophen,  albuterol, amphetamine-dextroamphetamine, [START ON 12/01/2022] amphetamine-dextroamphetamine, [START ON 12/31/2022] amphetamine-dextroamphetamine, famotidine, medi-first triple antibiotic, ondansetron, oxycodone hcl, pantoprazole, polyethylene glycol powder, prazosin, sumatriptan, trazodone, triamcinolone cream, bacitracin, baclofen, celecoxib, eucrisa, and simethicone. His primarily concern today is the Pain (Left waistling into groin)  Initial Vital Signs:  Pulse/HCG Rate: 74ECG Heart Rate: 71 Temp: 98 F (36.7 C) Resp: 16 BP: 118/84 SpO2: 99 %  BMI: Estimated body mass index is 33 kg/m as calculated from the following:   Height as of this encounter: 5\' 10"  (1.778 m).   Weight as of this encounter: 230 lb (104.3 kg).  Risk Assessment: Allergies: Reviewed. He is allergic to morphine, silicone, and tape.  Allergy Precautions: None required Coagulopathies: Reviewed. None identified.  Blood-thinner therapy: None at this time Active Infection(s): Reviewed. None identified. Darren Miller is afebrile  Site Confirmation: Darren Miller was asked to confirm the procedure and laterality before marking the site Procedure checklist: Completed Consent: Before the procedure and under the influence of no sedative(s), amnesic(s), or anxiolytics, the patient was informed of the treatment options, risks and possible complications. To fulfill our ethical and legal obligations, as recommended by the American Medical Association's Code of Ethics, I have informed the patient of my clinical impression; the nature and purpose of the treatment or procedure; the risks, benefits, and possible complications of the intervention; the alternatives, including doing nothing; the risk(s) and benefit(s) of the alternative treatment(s) or procedure(s); and the risk(s) and benefit(s) of doing nothing. The patient was provided information about the general risks and possible complications associated with the procedure. These may  include, but are not limited to: failure to achieve desired goals, infection, bleeding, organ or nerve damage, allergic reactions, paralysis, and death. In  addition, the patient was informed of those risks and complications associated to the procedure, such as failure to decrease pain; infection; bleeding; organ or nerve damage with subsequent damage to sensory, motor, and/or autonomic systems, resulting in permanent pain, numbness, and/or weakness of one or several areas of the body; allergic reactions; (i.e.: anaphylactic reaction); and/or death. Furthermore, the patient was informed of those risks and complications associated with the medications. These include, but are not limited to: allergic reactions (i.e.: anaphylactic or anaphylactoid reaction(s)); adrenal axis suppression; blood sugar elevation that in diabetics may result in ketoacidosis or comma; water retention that in patients with history of congestive heart failure may result in shortness of breath, pulmonary edema, and decompensation with resultant heart failure; weight gain; swelling or edema; medication-induced neural toxicity; particulate matter embolism and blood vessel occlusion with resultant organ, and/or nervous system infarction; and/or aseptic necrosis of one or more joints. Finally, the patient was informed that Medicine is not an exact science; therefore, there is also the possibility of unforeseen or unpredictable risks and/or possible complications that may result in a catastrophic outcome. The patient indicated having understood very clearly. We have given the patient no guarantees and we have made no promises. Enough time was given to the patient to ask questions, all of which were answered to the patient's satisfaction. Darren Miller has indicated that he wanted to continue with the procedure. Attestation: I, the ordering provider, attest that I have discussed with the patient the benefits, risks, side-effects, alternatives,  likelihood of achieving goals, and potential problems during recovery for the procedure that I have provided informed consent. Date  Time: 11/26/2022 10:05 AM  Pre-Procedure Preparation:  Monitoring: As per clinic protocol. Respiration, ETCO2, SpO2, BP, heart rate and rhythm monitor placed and checked for adequate function Safety Precautions: Patient was assessed for positional comfort and pressure points before starting the procedure. Time-out: I initiated and conducted the "Time-out" before starting the procedure, as per protocol. The patient was asked to participate by confirming the accuracy of the "Time Out" information. Verification of the correct person, site, and procedure were performed and confirmed by me, the nursing staff, and the patient. "Time-out" conducted as per Joint Commission's Universal Protocol (UP.01.01.01). Time: 1036 Start Time: 1037 hrs.  Description of Procedure:          Ultrasound Setup: The ultrasound machine was prepared and set to a linear high-frequency transducer with an appropriate frequency for soft tissue imaging. The probe was covered with sterile gel and draped with a sterile cover.  Identification of Target Structures: The ilioinguinal nerve was located using ultrasound guidance. The nerve was identified as it runs along the anterior abdominal wall, medial and slightly inferior to the anterior superior iliac spine  Needle Insertion: A sterile 22-gauge, 50mm needle was introduced under real-time ultrasound guidance. The needle was advanced in a plane parallel to the transducer, ensuring it was directed toward the ilioinguinal nerve. Aspiration was performed to ensure no vascular structures were punctured.  Injection of Local Anesthetic: Once the needle tip was confirmed to be in close proximity to the ilioinguinal nerve, 10 cc solution made of 9 cc of 0.2% ropivacaine, 1 cc of Decadron 10 mg/cc.  Was injected under continuous ultrasound visualization. The  spread of the local anesthetic was monitored to ensure adequate coverage of the target nerve.  Confirmation of Block: The spread of the anesthetic around the ilioinguinal nerve was confirmed by the change in tissue echogenicity. No complications were noted during the injection process.  Vitals:   11/26/22 1045 11/26/22 1050 11/26/22 1054 11/26/22 1059  BP: 124/87 (!) 137/92 (!) 96/57 119/75  Pulse:      Resp: 15 18 16 18   Temp:      TempSrc:      SpO2: 98% 99% 98% 99%  Weight:      Height:        Start Time: 1037 hrs. End Time:   hrs.      Materials:  Needle(s) Type:  Ultrasound PAJUNK needle Gauge: 22G Length: 3.5-in   Imaging Guidance (Non-Spinal):          Type of Imaging Technique: Fluoroscopy Guidance (Non-Spinal) Indication(s): Fluoroscopy guidance for needle placement to enhance accuracy in procedures requiring precise needle localization for targeted delivery of medication in or near specific anatomical locations not easily accessible without such real-time imaging assistance. Exposure Time: Please see nurses notes. Contrast: Before injecting any contrast, we confirmed that the patient did not have an allergy to iodine, shellfish, or radiological contrast. Once satisfactory needle placement was completed at the desired level, radiological contrast was injected. Contrast injected under live fluoroscopy. No contrast complications. See chart for type and volume of contrast used. Fluoroscopic Guidance: I was personally present during the use of fluoroscopy. "Tunnel Vision Technique" used to obtain the best possible view of the target area. Parallax error corrected before commencing the procedure. "Direction-depth-direction" technique used to introduce the needle under continuous pulsed fluoroscopy. Once target was reached, antero-posterior, oblique, and lateral fluoroscopic projection used confirm needle placement in all planes. Images permanently stored in EMR. Interpretation:  I personally interpreted the imaging intraoperatively. Adequate needle placement confirmed in multiple planes. Appropriate spread of contrast into desired area was observed. No evidence of afferent or efferent intravascular uptake. Permanent images saved into the patient's record.  Antibiotic Prophylaxis:   Anti-infectives (From admission, onward)    None      Indication(s): None identified  Post-operative Assessment:  Post-procedure Vital Signs:  Pulse/HCG Rate: 7470 Temp: 98 F (36.7 C) Resp: 18 BP: 119/75 SpO2: 99 %  EBL: None  Complications: No immediate post-treatment complications observed by team, or reported by patient.  Note: The patient tolerated the entire procedure well. A repeat set of vitals were taken after the procedure and the patient was kept under observation following institutional policy, for this type of procedure. Post-procedural neurological assessment was performed, showing return to baseline, prior to discharge. The patient was provided with post-procedure discharge instructions, including a section on how to identify potential problems. Should any problems arise concerning this procedure, the patient was given instructions to immediately contact us, at any time, without hesitation. In any case, we plan to contact the patient by telephone for a follow-up status report regarding this interventional procedure.  Comments:  No additional relevant information.  Plan of Care (POC)  Orders:  No orders of the defined types were placed in this encounter.  Reviewed patient's urine toxicology screen.  Small quantity of oxycodone as below to help manage his severe left hip and left lower quadrant pain. Medications ordered for procedure: Meds ordered this encounter  Medications   lidocaine (XYLOCAINE) 2 % (with pres) injection 400 mg   dexamethasone (DECADRON) injection 10 mg   ropivacaine (PF) 2 mg/mL (0.2%) (NAROPIN) injection 9 mL   Oxycodone HCl 10 MG TABS     Sig: Take 0.5-1 tablets (5-10 mg total) by mouth at bedtime as needed.    Dispense:  15 tablet    Refill:  0  Chronic Pain: STOP Act (Not applicable) Fill 1 day early if closed on refill date. Avoid benzodiazepines within 8 hours of opioids   Medications administered: We administered lidocaine, dexamethasone, and ropivacaine (PF) 2 mg/mL (0.2%).  See the medical record for exact dosing, route, and time of administration.  Follow-up plan:   Return in about 22 days (around 12/18/2022) for PPE, VV.       Left ultrasound-guided ilioinguinal nerve block    Recent Visits Date Type Provider Dept  10/25/22 Office Visit Edward Jolly, MD [facility]  Showing recent visits within past 90 days and meeting all other requirements Today's Visits Date Type Provider Dept  11/26/22 Procedure visit Edward Jolly, MD [facility]  Showing today's visits and meeting all other requirements Future Appointments Date Type Provider Dept  12/18/22 Appointment Edward Jolly, MD [facility]  Showing future appointments within next 90 days and meeting all other requirements  Disposition: Discharge home  Discharge (Date  Time): 11/26/2022; 1105 hrs.   Primary Care Physician: Marjie Skiff, NP Location: [facility] Note by: Edward Jolly, MD (TTS technology used. I apologize for any typographical errors that were not detected and corrected.) Date: 11/26/2022; Time: 11:08 AM  Disclaimer:  Medicine is not an Visual merchandiser. The only guarantee in medicine is that nothing is guaranteed. It is important to note that the decision to proceed with this intervention was based on the information collected from the patient. The Data and conclusions were drawn from the patient's questionnaire, the interview, and the physical examination. Because the information was provided in large part by the patient, it cannot be guaranteed that it has not been purposely or  unconsciously manipulated. Every effort has been made to obtain as much relevant data as possible for this evaluation. It is important to note that the conclusions that lead to this procedure are derived in large part from the available data. Always take into account that the treatment will also be dependent on availability of resources and existing treatment guidelines, considered by other Pain Management Practitioners as being common knowledge and practice, at the time of the intervention. For Medico-Legal purposes, it is also important to point out that variation in procedural techniques and pharmacological choices are the acceptable norm. The indications, contraindications, technique, and results of the above procedure should only be interpreted and judged by a Board-Certified Interventional Pain Specialist with extensive familiarity and expertise in the same exact procedure and technique.

## 2022-11-27 ENCOUNTER — Telehealth: Payer: Self-pay

## 2022-11-27 NOTE — Telephone Encounter (Signed)
Post procedure follow up.  Patient states he is doing fine.  

## 2022-12-05 ENCOUNTER — Other Ambulatory Visit: Payer: Self-pay | Admitting: Nurse Practitioner

## 2022-12-05 ENCOUNTER — Other Ambulatory Visit: Payer: Self-pay

## 2022-12-06 ENCOUNTER — Other Ambulatory Visit: Payer: Self-pay

## 2022-12-06 MED ORDER — TRIAMCINOLONE ACETONIDE 0.1 % EX CREA
1.0000 | TOPICAL_CREAM | Freq: Two times a day (BID) | CUTANEOUS | 0 refills | Status: AC | PRN
  Filled 2022-12-06: qty 15, 8d supply, fill #0

## 2022-12-06 NOTE — Telephone Encounter (Signed)
Requested medications are due for refill today.  unsure  Requested medications are on the active medications list.  yes  Last refill. 05/24/2022  Future visit scheduled.   yes  Notes to clinic.  Refill not delegated. Rx signed by another provider at [facility].    Requested Prescriptions  Pending Prescriptions Disp Refills   triamcinolone cream (KENALOG) 0.1 % 15 g 0    Sig: Apply topically 2 (two) times daily as needed (for eczema) for up to 30 days     Not Delegated - Dermatology:  Corticosteroids Failed - 12/05/2022 12:53 PM      Failed - This refill cannot be delegated      Passed - Valid encounter within last 12 months    Recent Outpatient Visits           1 month ago MDD (major depressive disorder), recurrent episode, mild (HCC)   [place] [facility] [place], Jolene T, NP   1 month ago MDD (major depressive disorder), recurrent episode, mild (HCC)   [place] [facility] [place], Dorie Rank, NP       Future Appointments             In 1 month [place], Dorie Rank, NP [facility]

## 2022-12-07 ENCOUNTER — Other Ambulatory Visit: Payer: Self-pay

## 2022-12-18 ENCOUNTER — Other Ambulatory Visit: Payer: Self-pay

## 2022-12-18 ENCOUNTER — Encounter: Payer: Self-pay | Admitting: Student in an Organized Health Care Education/Training Program

## 2022-12-18 ENCOUNTER — Ambulatory Visit
Payer: 59 | Attending: Student in an Organized Health Care Education/Training Program | Admitting: Student in an Organized Health Care Education/Training Program

## 2022-12-18 DIAGNOSIS — G5792 Unspecified mononeuropathy of left lower limb: Secondary | ICD-10-CM | POA: Diagnosis not present

## 2022-12-18 DIAGNOSIS — F4310 Post-traumatic stress disorder, unspecified: Secondary | ICD-10-CM

## 2022-12-18 DIAGNOSIS — M792 Neuralgia and neuritis, unspecified: Secondary | ICD-10-CM

## 2022-12-18 DIAGNOSIS — R1032 Left lower quadrant pain: Secondary | ICD-10-CM

## 2022-12-18 MED ORDER — BUPRENORPHINE 7.5 MCG/HR TD PTWK
1.0000 | MEDICATED_PATCH | TRANSDERMAL | 0 refills | Status: AC
  Filled 2022-12-18 – 2023-01-28 (×2): qty 4, 28d supply, fill #0

## 2022-12-18 MED ORDER — BUPRENORPHINE 5 MCG/HR TD PTWK
1.0000 | MEDICATED_PATCH | TRANSDERMAL | 0 refills | Status: AC
  Filled 2022-12-18 – 2022-12-20 (×3): qty 4, 28d supply, fill #0

## 2022-12-18 MED ORDER — OXYCODONE HCL 10 MG PO TABS
5.0000 mg | ORAL_TABLET | Freq: Every evening | ORAL | 0 refills | Status: AC | PRN
  Filled 2022-12-18 (×2): qty 30, 30d supply, fill #0

## 2022-12-18 MED ORDER — OXYCODONE HCL 10 MG PO TABS
10.0000 mg | ORAL_TABLET | Freq: Every day | ORAL | 0 refills | Status: AC | PRN
  Filled 2023-01-28: qty 30, 30d supply, fill #0

## 2022-12-18 MED ORDER — PREGABALIN 75 MG PO CAPS
75.0000 mg | ORAL_CAPSULE | Freq: Three times a day (TID) | ORAL | 0 refills | Status: DC
  Filled 2022-12-18 (×2): qty 90, 30d supply, fill #0
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
## 2022-12-19 ENCOUNTER — Other Ambulatory Visit (HOSPITAL_BASED_OUTPATIENT_CLINIC_OR_DEPARTMENT_OTHER): Payer: Self-pay

## 2022-12-20 ENCOUNTER — Other Ambulatory Visit: Payer: Self-pay

## 2022-12-21 ENCOUNTER — Ambulatory Visit: Payer: Self-pay

## 2022-12-21 DIAGNOSIS — K5900 Constipation, unspecified: Secondary | ICD-10-CM | POA: Diagnosis not present

## 2022-12-21 DIAGNOSIS — K439 Ventral hernia without obstruction or gangrene: Secondary | ICD-10-CM | POA: Diagnosis not present

## 2022-12-21 DIAGNOSIS — Z7901 Long term (current) use of anticoagulants: Secondary | ICD-10-CM | POA: Diagnosis not present

## 2022-12-21 DIAGNOSIS — Z87891 Personal history of nicotine dependence: Secondary | ICD-10-CM | POA: Diagnosis not present

## 2022-12-21 DIAGNOSIS — K432 Incisional hernia without obstruction or gangrene: Secondary | ICD-10-CM | POA: Diagnosis not present

## 2022-12-21 DIAGNOSIS — Z9889 Other specified postprocedural states: Secondary | ICD-10-CM | POA: Diagnosis not present

## 2022-12-21 DIAGNOSIS — Z933 Colostomy status: Secondary | ICD-10-CM | POA: Diagnosis not present

## 2022-12-21 DIAGNOSIS — K469 Unspecified abdominal hernia without obstruction or gangrene: Secondary | ICD-10-CM | POA: Diagnosis not present

## 2022-12-21 DIAGNOSIS — K219 Gastro-esophageal reflux disease without esophagitis: Secondary | ICD-10-CM | POA: Diagnosis not present

## 2022-12-21 DIAGNOSIS — R1032 Left lower quadrant pain: Secondary | ICD-10-CM | POA: Diagnosis not present

## 2022-12-21 NOTE — Telephone Encounter (Signed)
See mychart regarding this. Patient was advised by Jolene.
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
## 2022-12-22 DIAGNOSIS — K439 Ventral hernia without obstruction or gangrene: Secondary | ICD-10-CM | POA: Diagnosis not present

## 2022-12-31 ENCOUNTER — Other Ambulatory Visit: Payer: Self-pay

## 2022-12-31 MED ORDER — PEG-3350/ELECTROLYTES 236 G PO SOLR
4000.0000 mL | Freq: Once | ORAL | 0 refills | Status: AC
  Filled 2022-12-31: qty 4000, 1d supply, fill #0

## 2023-01-08 ENCOUNTER — Other Ambulatory Visit: Payer: Self-pay

## 2023-01-08 MED ORDER — OXYCODONE HCL 20 MG PO TABS
20.0000 mg | ORAL_TABLET | ORAL | 0 refills | Status: DC | PRN
  Filled 2023-01-08: qty 60, 10d supply, fill #0

## 2023-01-08 MED ORDER — CELECOXIB 200 MG PO CAPS
200.0000 mg | ORAL_CAPSULE | Freq: Every day | ORAL | 0 refills | Status: DC
  Filled 2023-01-08: qty 30, 30d supply, fill #0

## 2023-01-08 MED ORDER — SENNOSIDES 8.6 MG PO TABS: ORAL_TABLET | ORAL | 0 refills | Status: DC

## 2023-01-08 MED ORDER — MILK OF MAGNESIA 1200 MG/15ML PO SUSP: ORAL | 0 refills | Status: DC

## 2023-01-08 MED ORDER — ACETAMINOPHEN 325 MG PO TABS: ORAL_TABLET | ORAL | 0 refills | Status: DC

## 2023-01-08 MED ORDER — METHOCARBAMOL 500 MG PO TABS
500.0000 mg | ORAL_TABLET | Freq: Three times a day (TID) | ORAL | 0 refills | Status: DC
  Filled 2023-01-08: qty 90, 30d supply, fill #0

## 2023-01-08 MED ORDER — BISACODYL 10 MG RE SUPP: RECTAL | 0 refills | Status: DC

## 2023-01-08 MED ORDER — POLYETHYLENE GLYCOL 3350 17 GM/SCOOP PO POWD
17.0000 g | Freq: Two times a day (BID) | ORAL | 0 refills | Status: DC
  Filled 2023-01-08 (×2): qty 952, 28d supply, fill #0

## 2023-01-10 DIAGNOSIS — E66811 Obesity, class 1: Secondary | ICD-10-CM | POA: Diagnosis not present

## 2023-01-10 DIAGNOSIS — K660 Peritoneal adhesions (postprocedural) (postinfection): Secondary | ICD-10-CM | POA: Diagnosis not present

## 2023-01-10 DIAGNOSIS — E6609 Other obesity due to excess calories: Secondary | ICD-10-CM | POA: Diagnosis not present

## 2023-01-10 DIAGNOSIS — G894 Chronic pain syndrome: Secondary | ICD-10-CM | POA: Diagnosis not present

## 2023-01-10 DIAGNOSIS — F4323 Adjustment disorder with mixed anxiety and depressed mood: Secondary | ICD-10-CM | POA: Diagnosis not present

## 2023-01-10 DIAGNOSIS — K631 Perforation of intestine (nontraumatic): Secondary | ICD-10-CM | POA: Diagnosis not present

## 2023-01-10 DIAGNOSIS — K5989 Other specified functional intestinal disorders: Secondary | ICD-10-CM | POA: Diagnosis not present

## 2023-01-10 DIAGNOSIS — J95821 Acute postprocedural respiratory failure: Secondary | ICD-10-CM | POA: Diagnosis not present

## 2023-01-10 DIAGNOSIS — K6389 Other specified diseases of intestine: Secondary | ICD-10-CM | POA: Diagnosis not present

## 2023-01-10 DIAGNOSIS — K5939 Other megacolon: Secondary | ICD-10-CM | POA: Diagnosis not present

## 2023-01-10 DIAGNOSIS — N5082 Scrotal pain: Secondary | ICD-10-CM | POA: Diagnosis not present

## 2023-01-10 DIAGNOSIS — R188 Other ascites: Secondary | ICD-10-CM | POA: Diagnosis not present

## 2023-01-10 DIAGNOSIS — K559 Vascular disorder of intestine, unspecified: Secondary | ICD-10-CM | POA: Diagnosis not present

## 2023-01-10 DIAGNOSIS — K5651 Intestinal adhesions [bands], with partial obstruction: Secondary | ICD-10-CM | POA: Diagnosis not present

## 2023-01-10 DIAGNOSIS — Z789 Other specified health status: Secondary | ICD-10-CM | POA: Diagnosis not present

## 2023-01-10 DIAGNOSIS — E861 Hypovolemia: Secondary | ICD-10-CM | POA: Diagnosis not present

## 2023-01-10 DIAGNOSIS — G8918 Other acute postprocedural pain: Secondary | ICD-10-CM | POA: Diagnosis not present

## 2023-01-10 DIAGNOSIS — R140 Abdominal distension (gaseous): Secondary | ICD-10-CM | POA: Diagnosis not present

## 2023-01-10 DIAGNOSIS — Z9889 Other specified postprocedural states: Secondary | ICD-10-CM | POA: Diagnosis not present

## 2023-01-10 DIAGNOSIS — K9189 Other postprocedural complications and disorders of digestive system: Secondary | ICD-10-CM | POA: Diagnosis not present

## 2023-01-10 DIAGNOSIS — J9601 Acute respiratory failure with hypoxia: Secondary | ICD-10-CM | POA: Diagnosis not present

## 2023-01-10 DIAGNOSIS — K567 Ileus, unspecified: Secondary | ICD-10-CM | POA: Diagnosis not present

## 2023-01-10 DIAGNOSIS — J9811 Atelectasis: Secondary | ICD-10-CM | POA: Diagnosis not present

## 2023-01-10 DIAGNOSIS — F17290 Nicotine dependence, other tobacco product, uncomplicated: Secondary | ICD-10-CM | POA: Diagnosis not present

## 2023-01-10 DIAGNOSIS — Z87828 Personal history of other (healed) physical injury and trauma: Secondary | ICD-10-CM | POA: Diagnosis not present

## 2023-01-10 DIAGNOSIS — R935 Abnormal findings on diagnostic imaging of other abdominal regions, including retroperitoneum: Secondary | ICD-10-CM | POA: Diagnosis not present

## 2023-01-10 DIAGNOSIS — Z8719 Personal history of other diseases of the digestive system: Secondary | ICD-10-CM | POA: Diagnosis not present

## 2023-01-10 DIAGNOSIS — Z980 Intestinal bypass and anastomosis status: Secondary | ICD-10-CM | POA: Diagnosis not present

## 2023-01-10 DIAGNOSIS — R918 Other nonspecific abnormal finding of lung field: Secondary | ICD-10-CM | POA: Diagnosis not present

## 2023-01-10 DIAGNOSIS — K432 Incisional hernia without obstruction or gangrene: Secondary | ICD-10-CM | POA: Diagnosis not present

## 2023-01-10 DIAGNOSIS — A419 Sepsis, unspecified organism: Secondary | ICD-10-CM | POA: Diagnosis not present

## 2023-01-10 DIAGNOSIS — Z4682 Encounter for fitting and adjustment of non-vascular catheter: Secondary | ICD-10-CM | POA: Diagnosis not present

## 2023-01-10 DIAGNOSIS — R7303 Prediabetes: Secondary | ICD-10-CM | POA: Diagnosis not present

## 2023-01-10 DIAGNOSIS — Z9049 Acquired absence of other specified parts of digestive tract: Secondary | ICD-10-CM | POA: Diagnosis not present

## 2023-01-10 DIAGNOSIS — I517 Cardiomegaly: Secondary | ICD-10-CM | POA: Diagnosis not present

## 2023-01-10 DIAGNOSIS — R1084 Generalized abdominal pain: Secondary | ICD-10-CM | POA: Diagnosis not present

## 2023-01-10 DIAGNOSIS — D72825 Bandemia: Secondary | ICD-10-CM | POA: Diagnosis not present

## 2023-01-10 DIAGNOSIS — I959 Hypotension, unspecified: Secondary | ICD-10-CM | POA: Diagnosis not present

## 2023-01-11 DIAGNOSIS — K5651 Intestinal adhesions [bands], with partial obstruction: Secondary | ICD-10-CM | POA: Diagnosis not present

## 2023-01-11 DIAGNOSIS — K432 Incisional hernia without obstruction or gangrene: Secondary | ICD-10-CM | POA: Diagnosis not present

## 2023-01-11 DIAGNOSIS — K660 Peritoneal adhesions (postprocedural) (postinfection): Secondary | ICD-10-CM | POA: Diagnosis not present

## 2023-01-11 DIAGNOSIS — J95821 Acute postprocedural respiratory failure: Secondary | ICD-10-CM | POA: Diagnosis not present

## 2023-01-11 DIAGNOSIS — J9811 Atelectasis: Secondary | ICD-10-CM | POA: Diagnosis not present

## 2023-01-11 DIAGNOSIS — R188 Other ascites: Secondary | ICD-10-CM | POA: Diagnosis not present

## 2023-01-11 DIAGNOSIS — Z4682 Encounter for fitting and adjustment of non-vascular catheter: Secondary | ICD-10-CM | POA: Diagnosis not present

## 2023-01-11 DIAGNOSIS — G8918 Other acute postprocedural pain: Secondary | ICD-10-CM | POA: Diagnosis not present

## 2023-01-11 DIAGNOSIS — K6389 Other specified diseases of intestine: Secondary | ICD-10-CM | POA: Diagnosis not present

## 2023-01-11 DIAGNOSIS — K631 Perforation of intestine (nontraumatic): Secondary | ICD-10-CM | POA: Diagnosis not present

## 2023-01-11 DIAGNOSIS — Z9889 Other specified postprocedural states: Secondary | ICD-10-CM | POA: Diagnosis not present

## 2023-01-12 DIAGNOSIS — K567 Ileus, unspecified: Secondary | ICD-10-CM | POA: Diagnosis not present

## 2023-01-12 DIAGNOSIS — Z9049 Acquired absence of other specified parts of digestive tract: Secondary | ICD-10-CM | POA: Insufficient documentation

## 2023-01-12 DIAGNOSIS — R1084 Generalized abdominal pain: Secondary | ICD-10-CM | POA: Diagnosis not present

## 2023-01-12 DIAGNOSIS — A419 Sepsis, unspecified organism: Secondary | ICD-10-CM | POA: Diagnosis not present

## 2023-01-12 DIAGNOSIS — Z9889 Other specified postprocedural states: Secondary | ICD-10-CM | POA: Diagnosis not present

## 2023-01-12 DIAGNOSIS — I959 Hypotension, unspecified: Secondary | ICD-10-CM | POA: Diagnosis not present

## 2023-01-12 DIAGNOSIS — J9601 Acute respiratory failure with hypoxia: Secondary | ICD-10-CM | POA: Diagnosis not present

## 2023-01-12 DIAGNOSIS — E861 Hypovolemia: Secondary | ICD-10-CM | POA: Diagnosis not present

## 2023-01-12 DIAGNOSIS — K631 Perforation of intestine (nontraumatic): Secondary | ICD-10-CM | POA: Diagnosis not present

## 2023-01-12 DIAGNOSIS — Z8719 Personal history of other diseases of the digestive system: Secondary | ICD-10-CM | POA: Insufficient documentation

## 2023-01-12 DIAGNOSIS — K432 Incisional hernia without obstruction or gangrene: Secondary | ICD-10-CM | POA: Diagnosis not present

## 2023-01-12 DIAGNOSIS — D72825 Bandemia: Secondary | ICD-10-CM | POA: Diagnosis not present

## 2023-01-13 DIAGNOSIS — K567 Ileus, unspecified: Secondary | ICD-10-CM | POA: Diagnosis not present

## 2023-01-13 DIAGNOSIS — J9601 Acute respiratory failure with hypoxia: Secondary | ICD-10-CM | POA: Diagnosis not present

## 2023-01-13 DIAGNOSIS — D72825 Bandemia: Secondary | ICD-10-CM | POA: Diagnosis not present

## 2023-01-13 DIAGNOSIS — K631 Perforation of intestine (nontraumatic): Secondary | ICD-10-CM | POA: Diagnosis not present

## 2023-01-13 DIAGNOSIS — A419 Sepsis, unspecified organism: Secondary | ICD-10-CM | POA: Diagnosis not present

## 2023-01-13 DIAGNOSIS — I959 Hypotension, unspecified: Secondary | ICD-10-CM | POA: Diagnosis not present

## 2023-01-13 DIAGNOSIS — Z9889 Other specified postprocedural states: Secondary | ICD-10-CM | POA: Diagnosis not present

## 2023-01-13 DIAGNOSIS — E861 Hypovolemia: Secondary | ICD-10-CM | POA: Diagnosis not present

## 2023-01-14 ENCOUNTER — Other Ambulatory Visit (HOSPITAL_BASED_OUTPATIENT_CLINIC_OR_DEPARTMENT_OTHER): Payer: Self-pay

## 2023-01-14 DIAGNOSIS — A419 Sepsis, unspecified organism: Secondary | ICD-10-CM | POA: Diagnosis not present

## 2023-01-14 DIAGNOSIS — K567 Ileus, unspecified: Secondary | ICD-10-CM | POA: Diagnosis not present

## 2023-01-14 DIAGNOSIS — J9601 Acute respiratory failure with hypoxia: Secondary | ICD-10-CM | POA: Diagnosis not present

## 2023-01-14 DIAGNOSIS — D72825 Bandemia: Secondary | ICD-10-CM | POA: Diagnosis not present

## 2023-01-15 DIAGNOSIS — D72825 Bandemia: Secondary | ICD-10-CM | POA: Diagnosis not present

## 2023-01-15 DIAGNOSIS — K6389 Other specified diseases of intestine: Secondary | ICD-10-CM | POA: Diagnosis not present

## 2023-01-15 DIAGNOSIS — K567 Ileus, unspecified: Secondary | ICD-10-CM | POA: Diagnosis not present

## 2023-01-15 DIAGNOSIS — J9601 Acute respiratory failure with hypoxia: Secondary | ICD-10-CM | POA: Diagnosis not present

## 2023-01-15 DIAGNOSIS — K432 Incisional hernia without obstruction or gangrene: Secondary | ICD-10-CM | POA: Diagnosis not present

## 2023-01-15 DIAGNOSIS — A419 Sepsis, unspecified organism: Secondary | ICD-10-CM | POA: Diagnosis not present

## 2023-01-16 DIAGNOSIS — K9189 Other postprocedural complications and disorders of digestive system: Secondary | ICD-10-CM | POA: Diagnosis not present

## 2023-01-16 DIAGNOSIS — F4323 Adjustment disorder with mixed anxiety and depressed mood: Secondary | ICD-10-CM | POA: Diagnosis not present

## 2023-01-16 DIAGNOSIS — Z87828 Personal history of other (healed) physical injury and trauma: Secondary | ICD-10-CM | POA: Diagnosis not present

## 2023-01-16 DIAGNOSIS — Z9889 Other specified postprocedural states: Secondary | ICD-10-CM | POA: Diagnosis not present

## 2023-01-16 DIAGNOSIS — R140 Abdominal distension (gaseous): Secondary | ICD-10-CM | POA: Diagnosis not present

## 2023-01-16 DIAGNOSIS — E66811 Obesity, class 1: Secondary | ICD-10-CM | POA: Diagnosis not present

## 2023-01-16 DIAGNOSIS — E6609 Other obesity due to excess calories: Secondary | ICD-10-CM | POA: Diagnosis not present

## 2023-01-16 DIAGNOSIS — K631 Perforation of intestine (nontraumatic): Secondary | ICD-10-CM | POA: Diagnosis not present

## 2023-01-16 DIAGNOSIS — Z789 Other specified health status: Secondary | ICD-10-CM | POA: Diagnosis not present

## 2023-01-16 DIAGNOSIS — E861 Hypovolemia: Secondary | ICD-10-CM | POA: Diagnosis not present

## 2023-01-16 DIAGNOSIS — N5082 Scrotal pain: Secondary | ICD-10-CM | POA: Diagnosis not present

## 2023-01-16 DIAGNOSIS — R188 Other ascites: Secondary | ICD-10-CM | POA: Diagnosis not present

## 2023-01-16 DIAGNOSIS — R935 Abnormal findings on diagnostic imaging of other abdominal regions, including retroperitoneum: Secondary | ICD-10-CM | POA: Diagnosis not present

## 2023-01-16 DIAGNOSIS — Z9049 Acquired absence of other specified parts of digestive tract: Secondary | ICD-10-CM | POA: Diagnosis not present

## 2023-01-16 DIAGNOSIS — K432 Incisional hernia without obstruction or gangrene: Secondary | ICD-10-CM | POA: Diagnosis not present

## 2023-01-16 DIAGNOSIS — K567 Ileus, unspecified: Secondary | ICD-10-CM | POA: Diagnosis not present

## 2023-01-16 DIAGNOSIS — F17290 Nicotine dependence, other tobacco product, uncomplicated: Secondary | ICD-10-CM | POA: Diagnosis not present

## 2023-01-16 DIAGNOSIS — R1084 Generalized abdominal pain: Secondary | ICD-10-CM | POA: Diagnosis not present

## 2023-01-16 DIAGNOSIS — R7303 Prediabetes: Secondary | ICD-10-CM | POA: Diagnosis not present

## 2023-01-16 DIAGNOSIS — K559 Vascular disorder of intestine, unspecified: Secondary | ICD-10-CM | POA: Diagnosis not present

## 2023-01-16 DIAGNOSIS — K5939 Other megacolon: Secondary | ICD-10-CM | POA: Diagnosis not present

## 2023-01-16 DIAGNOSIS — Z8719 Personal history of other diseases of the digestive system: Secondary | ICD-10-CM | POA: Diagnosis not present

## 2023-01-16 DIAGNOSIS — G894 Chronic pain syndrome: Secondary | ICD-10-CM | POA: Diagnosis not present

## 2023-01-16 DIAGNOSIS — G8918 Other acute postprocedural pain: Secondary | ICD-10-CM | POA: Diagnosis not present

## 2023-01-16 DIAGNOSIS — K6389 Other specified diseases of intestine: Secondary | ICD-10-CM | POA: Diagnosis not present

## 2023-01-17 ENCOUNTER — Other Ambulatory Visit: Payer: Self-pay

## 2023-01-17 DIAGNOSIS — K567 Ileus, unspecified: Secondary | ICD-10-CM | POA: Diagnosis not present

## 2023-01-17 DIAGNOSIS — Z9049 Acquired absence of other specified parts of digestive tract: Secondary | ICD-10-CM | POA: Diagnosis not present

## 2023-01-17 DIAGNOSIS — R935 Abnormal findings on diagnostic imaging of other abdominal regions, including retroperitoneum: Secondary | ICD-10-CM | POA: Diagnosis not present

## 2023-01-17 DIAGNOSIS — Z789 Other specified health status: Secondary | ICD-10-CM | POA: Diagnosis not present

## 2023-01-17 DIAGNOSIS — Z9889 Other specified postprocedural states: Secondary | ICD-10-CM | POA: Diagnosis not present

## 2023-01-17 DIAGNOSIS — Z8719 Personal history of other diseases of the digestive system: Secondary | ICD-10-CM | POA: Diagnosis not present

## 2023-01-17 DIAGNOSIS — R1084 Generalized abdominal pain: Secondary | ICD-10-CM | POA: Diagnosis not present

## 2023-01-17 DIAGNOSIS — G8918 Other acute postprocedural pain: Secondary | ICD-10-CM | POA: Diagnosis not present

## 2023-01-17 DIAGNOSIS — G894 Chronic pain syndrome: Secondary | ICD-10-CM | POA: Diagnosis not present

## 2023-01-17 DIAGNOSIS — R140 Abdominal distension (gaseous): Secondary | ICD-10-CM | POA: Diagnosis not present

## 2023-01-17 DIAGNOSIS — E66811 Obesity, class 1: Secondary | ICD-10-CM | POA: Diagnosis not present

## 2023-01-17 DIAGNOSIS — Z87828 Personal history of other (healed) physical injury and trauma: Secondary | ICD-10-CM | POA: Diagnosis not present

## 2023-01-17 DIAGNOSIS — E6609 Other obesity due to excess calories: Secondary | ICD-10-CM | POA: Diagnosis not present

## 2023-01-18 DIAGNOSIS — Z8719 Personal history of other diseases of the digestive system: Secondary | ICD-10-CM | POA: Diagnosis not present

## 2023-01-18 DIAGNOSIS — Z789 Other specified health status: Secondary | ICD-10-CM | POA: Diagnosis not present

## 2023-01-18 DIAGNOSIS — R1084 Generalized abdominal pain: Secondary | ICD-10-CM | POA: Diagnosis not present

## 2023-01-18 DIAGNOSIS — R140 Abdominal distension (gaseous): Secondary | ICD-10-CM | POA: Diagnosis not present

## 2023-01-18 DIAGNOSIS — F4323 Adjustment disorder with mixed anxiety and depressed mood: Secondary | ICD-10-CM | POA: Diagnosis not present

## 2023-01-18 DIAGNOSIS — R935 Abnormal findings on diagnostic imaging of other abdominal regions, including retroperitoneum: Secondary | ICD-10-CM | POA: Diagnosis not present

## 2023-01-18 DIAGNOSIS — E6609 Other obesity due to excess calories: Secondary | ICD-10-CM | POA: Diagnosis not present

## 2023-01-18 DIAGNOSIS — K567 Ileus, unspecified: Secondary | ICD-10-CM | POA: Diagnosis not present

## 2023-01-18 DIAGNOSIS — E66811 Obesity, class 1: Secondary | ICD-10-CM | POA: Diagnosis not present

## 2023-01-18 DIAGNOSIS — K5939 Other megacolon: Secondary | ICD-10-CM | POA: Diagnosis not present

## 2023-01-18 DIAGNOSIS — Z9889 Other specified postprocedural states: Secondary | ICD-10-CM | POA: Diagnosis not present

## 2023-01-18 DIAGNOSIS — G894 Chronic pain syndrome: Secondary | ICD-10-CM | POA: Diagnosis not present

## 2023-01-19 DIAGNOSIS — G8918 Other acute postprocedural pain: Secondary | ICD-10-CM | POA: Diagnosis not present

## 2023-01-19 DIAGNOSIS — K567 Ileus, unspecified: Secondary | ICD-10-CM | POA: Diagnosis not present

## 2023-01-19 DIAGNOSIS — K631 Perforation of intestine (nontraumatic): Secondary | ICD-10-CM | POA: Diagnosis not present

## 2023-01-19 DIAGNOSIS — K9189 Other postprocedural complications and disorders of digestive system: Secondary | ICD-10-CM | POA: Diagnosis not present

## 2023-01-19 DIAGNOSIS — K432 Incisional hernia without obstruction or gangrene: Secondary | ICD-10-CM | POA: Diagnosis not present

## 2023-01-19 DIAGNOSIS — Z9049 Acquired absence of other specified parts of digestive tract: Secondary | ICD-10-CM | POA: Diagnosis not present

## 2023-01-20 DIAGNOSIS — G8918 Other acute postprocedural pain: Secondary | ICD-10-CM | POA: Diagnosis not present

## 2023-01-20 DIAGNOSIS — K9189 Other postprocedural complications and disorders of digestive system: Secondary | ICD-10-CM | POA: Diagnosis not present

## 2023-01-20 DIAGNOSIS — K567 Ileus, unspecified: Secondary | ICD-10-CM | POA: Diagnosis not present

## 2023-01-20 DIAGNOSIS — Z9049 Acquired absence of other specified parts of digestive tract: Secondary | ICD-10-CM | POA: Diagnosis not present

## 2023-01-20 DIAGNOSIS — K631 Perforation of intestine (nontraumatic): Secondary | ICD-10-CM | POA: Diagnosis not present

## 2023-01-20 DIAGNOSIS — K432 Incisional hernia without obstruction or gangrene: Secondary | ICD-10-CM | POA: Diagnosis not present

## 2023-01-21 DIAGNOSIS — K6389 Other specified diseases of intestine: Secondary | ICD-10-CM | POA: Diagnosis not present

## 2023-01-21 DIAGNOSIS — Z9889 Other specified postprocedural states: Secondary | ICD-10-CM | POA: Diagnosis not present

## 2023-01-21 DIAGNOSIS — R188 Other ascites: Secondary | ICD-10-CM | POA: Diagnosis not present

## 2023-01-24 DIAGNOSIS — K432 Incisional hernia without obstruction or gangrene: Secondary | ICD-10-CM | POA: Diagnosis not present

## 2023-01-25 ENCOUNTER — Other Ambulatory Visit: Payer: Self-pay

## 2023-01-25 MED ORDER — ACETAMINOPHEN 325 MG PO TABS: ORAL_TABLET | ORAL | 0 refills | Status: DC

## 2023-01-25 MED ORDER — SIMETHICONE 80 MG PO CHEW: 80.0000 mg | CHEWABLE_TABLET | Freq: Four times a day (QID) | ORAL | 0 refills | Status: AC | PRN

## 2023-01-25 MED ORDER — CELECOXIB 200 MG PO CAPS
400.0000 mg | ORAL_CAPSULE | Freq: Every day | ORAL | 0 refills | Status: DC
  Filled 2023-01-25: qty 60, 30d supply, fill #0
  Filled 2023-01-25: qty 30, 30d supply, fill #0

## 2023-01-25 MED ORDER — PREGABALIN 75 MG PO CAPS
75.0000 mg | ORAL_CAPSULE | Freq: Three times a day (TID) | ORAL | 11 refills | Status: DC
  Filled 2023-01-25: qty 90, 30d supply, fill #0

## 2023-01-25 MED ORDER — BUPROPION HCL ER (XL) 150 MG PO TB24
150.0000 mg | ORAL_TABLET | Freq: Every day | ORAL | 11 refills | Status: DC
  Filled 2023-01-25: qty 30, 30d supply, fill #0

## 2023-01-25 MED ORDER — AMOXICILLIN-POT CLAVULANATE 875-125 MG PO TABS
1.0000 | ORAL_TABLET | Freq: Two times a day (BID) | ORAL | 0 refills | Status: DC
  Filled 2023-01-25: qty 28, 14d supply, fill #0

## 2023-01-25 MED ORDER — DIPHENOXYLATE-ATROPINE 2.5-0.025 MG PO TABS
1.0000 | ORAL_TABLET | Freq: Four times a day (QID) | ORAL | 0 refills | Status: DC | PRN
  Filled 2023-01-25: qty 40, 10d supply, fill #0

## 2023-01-25 MED ORDER — OXYCODONE HCL 20 MG PO TABS
20.0000 mg | ORAL_TABLET | ORAL | 0 refills | Status: DC | PRN
  Filled 2023-01-25: qty 60, 10d supply, fill #0

## 2023-01-26 NOTE — Patient Instructions (Signed)

## 2023-01-28 ENCOUNTER — Other Ambulatory Visit: Payer: Self-pay

## 2023-01-29 ENCOUNTER — Other Ambulatory Visit: Payer: Self-pay

## 2023-01-29 ENCOUNTER — Encounter: Payer: Self-pay | Admitting: Nurse Practitioner

## 2023-01-29 ENCOUNTER — Ambulatory Visit: Payer: 59 | Admitting: Nurse Practitioner

## 2023-01-29 VITALS — BP 124/73 | HR 105 | Temp 98.1°F | Ht 70.0 in | Wt 215.0 lb

## 2023-01-29 DIAGNOSIS — N5239 Other post-surgical erectile dysfunction: Secondary | ICD-10-CM | POA: Diagnosis not present

## 2023-01-29 DIAGNOSIS — Z1159 Encounter for screening for other viral diseases: Secondary | ICD-10-CM

## 2023-01-29 DIAGNOSIS — F988 Other specified behavioral and emotional disorders with onset usually occurring in childhood and adolescence: Secondary | ICD-10-CM

## 2023-01-29 DIAGNOSIS — D649 Anemia, unspecified: Secondary | ICD-10-CM | POA: Diagnosis not present

## 2023-01-29 DIAGNOSIS — F4310 Post-traumatic stress disorder, unspecified: Secondary | ICD-10-CM | POA: Diagnosis not present

## 2023-01-29 DIAGNOSIS — F5104 Psychophysiologic insomnia: Secondary | ICD-10-CM | POA: Diagnosis not present

## 2023-01-29 DIAGNOSIS — F330 Major depressive disorder, recurrent, mild: Secondary | ICD-10-CM | POA: Diagnosis not present

## 2023-01-29 DIAGNOSIS — N529 Male erectile dysfunction, unspecified: Secondary | ICD-10-CM | POA: Insufficient documentation

## 2023-01-29 MED ORDER — AMPHETAMINE-DEXTROAMPHETAMINE 20 MG PO TABS: 20.0000 mg | ORAL_TABLET | Freq: Two times a day (BID) | ORAL | 0 refills | Status: DC

## 2023-01-29 MED ORDER — TADALAFIL 10 MG PO TABS
10.0000 mg | ORAL_TABLET | ORAL | 2 refills | Status: DC | PRN
  Filled 2023-01-29: qty 6, 30d supply, fill #0
  Filled 2023-12-17: qty 6, 30d supply, fill #1
  Filled 2024-01-20: qty 6, 30d supply, fill #2

## 2023-01-29 MED ORDER — PRAZOSIN HCL 2 MG PO CAPS
2.0000 mg | ORAL_CAPSULE | Freq: Every day | ORAL | 1 refills | Status: DC
  Filled 2023-01-29: qty 90, 90d supply, fill #0
  Filled 2023-02-26: qty 90, 90d supply, fill #1

## 2023-01-29 MED ORDER — AMPHETAMINE-DEXTROAMPHETAMINE 20 MG PO TABS
20.0000 mg | ORAL_TABLET | Freq: Two times a day (BID) | ORAL | 0 refills | Status: DC
  Filled 2023-04-05: qty 60, 30d supply, fill #0

## 2023-01-29 MED ORDER — BUPROPION HCL ER (XL) 300 MG PO TB24
300.0000 mg | ORAL_TABLET | Freq: Every day | ORAL | 1 refills | Status: DC
  Filled 2023-01-29: qty 90, 90d supply, fill #0
  Filled 2023-02-26 – 2023-06-13 (×2): qty 90, 90d supply, fill #1

## 2023-01-29 MED ORDER — AMPHETAMINE-DEXTROAMPHETAMINE 20 MG PO TABS
20.0000 mg | ORAL_TABLET | Freq: Two times a day (BID) | ORAL | 0 refills | Status: DC
  Filled 2023-01-29: qty 60, 30d supply, fill #0

## 2023-01-29 MED ORDER — ZOLPIDEM TARTRATE 5 MG PO TABS
5.0000 mg | ORAL_TABLET | Freq: Every evening | ORAL | 1 refills | Status: DC | PRN
Start: 2023-01-29 — End: 2023-03-05
  Filled 2023-01-29 (×2): qty 15, 15d supply, fill #0
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
## 2023-01-29 NOTE — Assessment & Plan Note (Signed)
 Chronic, ongoing since middle school.  Will continue Adderall 20 MG BID as has been on for years with benefit.  Educated him on office rules for controlled substances.  He has signed controlled substance agreement, UDS due next 10/29/23, and agrees to every 3 month visits.  Refills sent in today -- 3 refills predated.
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
## 2023-01-29 NOTE — Assessment & Plan Note (Signed)
 Has used Cialis in past with benefit, having some issues after all his recent surgeries and hospitalizations.  Will send Cialis in today.

## 2023-01-30 ENCOUNTER — Other Ambulatory Visit: Payer: Self-pay

## 2023-01-30 LAB — CBC WITH DIFFERENTIAL/PLATELET
Basophils Absolute: 0.1 10*3/uL (ref 0.0–0.2)
Basos: 2 %
EOS (ABSOLUTE): 0.2 10*3/uL (ref 0.0–0.4)
Eos: 3 %
Hematocrit: 36.3 % — ABNORMAL LOW (ref 37.5–51.0)
Hemoglobin: 11.8 g/dL — ABNORMAL LOW (ref 13.0–17.7)
Immature Grans (Abs): 0 10*3/uL (ref 0.0–0.1)
Immature Granulocytes: 0 %
Lymphocytes Absolute: 1.5 10*3/uL (ref 0.7–3.1)
Lymphs: 29 %
MCH: 27.4 pg (ref 26.6–33.0)
MCHC: 32.5 g/dL (ref 31.5–35.7)
MCV: 84 fL (ref 79–97)
Monocytes Absolute: 0.7 10*3/uL (ref 0.1–0.9)
Monocytes: 13 %
Neutrophils Absolute: 2.8 10*3/uL (ref 1.4–7.0)
Neutrophils: 53 %
Platelets: 551 10*3/uL — ABNORMAL HIGH (ref 150–450)
RBC: 4.31 x10E6/uL (ref 4.14–5.80)
RDW: 13.3 % (ref 11.6–15.4)
WBC: 5.3 10*3/uL (ref 3.4–10.8)

## 2023-01-30 LAB — COMPREHENSIVE METABOLIC PANEL
ALT: 35 [IU]/L (ref 0–44)
AST: 26 [IU]/L (ref 0–40)
Albumin: 4.3 g/dL (ref 4.1–5.1)
Alkaline Phosphatase: 182 [IU]/L — ABNORMAL HIGH (ref 44–121)
BUN/Creatinine Ratio: 23 — ABNORMAL HIGH (ref 9–20)
BUN: 23 mg/dL — ABNORMAL HIGH (ref 6–20)
Bilirubin Total: <0.2 mg/dL (ref 0.0–1.2)
CO2: 21 mmol/L (ref 20–29)
Calcium: 9.5 mg/dL (ref 8.7–10.2)
Chloride: 97 mmol/L (ref 96–106)
Creatinine, Ser: 1 mg/dL (ref 0.76–1.27)
Globulin, Total: 3.9 g/dL (ref 1.5–4.5)
Glucose: 95 mg/dL (ref 70–99)
Potassium: 4.8 mmol/L (ref 3.5–5.2)
Sodium: 132 mmol/L — ABNORMAL LOW (ref 134–144)
Total Protein: 8.2 g/dL (ref 6.0–8.5)
eGFR: 98 mL/min/{1.73_m2} (ref >59)

## 2023-01-30 LAB — HEPATITIS C ANTIBODY: Hep C Virus Ab: NONREACTIVE

## 2023-01-30 LAB — IRON: Iron: 44 ug/dL (ref 38–169)

## 2023-01-30 LAB — FERRITIN: Ferritin: 146 ng/mL (ref 30–400)

## 2023-01-30 NOTE — Progress Notes (Signed)
 Contacted via MyChart   Good evening Darren Miller, your labs have returned: - CBC shows mildly low hemoglobin and hematocrit still, which is often present post surgery.  Iron is on lower side of normal too.  For now it would be good to take an iron supplement like Slow Fe which will not constipate or a multivitamin.  You can also increase iron rich foods to help.  Platelets a little up, which I suspect is related to recent surgery too and we will recheck in a month. - Kidney function, creatinine and eGFR, remains normal, as is liver function, AST and ALT.  - Sodium level a little low, I recommend adding some tablet salt to diet and reduce water intake just a little to help improve level. - Remainder of labs are stable.  Any questions? Keep being amazing!!  Thank you for allowing me to participate in your care.  I appreciate you. Kindest regards, Masoud Nyce

## 2023-02-06 DIAGNOSIS — K659 Peritonitis, unspecified: Secondary | ICD-10-CM | POA: Diagnosis not present

## 2023-02-06 DIAGNOSIS — C220 Liver cell carcinoma: Secondary | ICD-10-CM | POA: Diagnosis not present

## 2023-02-07 ENCOUNTER — Other Ambulatory Visit: Payer: Self-pay

## 2023-02-07 ENCOUNTER — Ambulatory Visit
Payer: 59 | Attending: Student in an Organized Health Care Education/Training Program | Admitting: Student in an Organized Health Care Education/Training Program

## 2023-02-07 ENCOUNTER — Encounter: Payer: Self-pay | Admitting: Student in an Organized Health Care Education/Training Program

## 2023-02-07 VITALS — BP 113/81 | HR 93 | Temp 98.0°F | Resp 16 | Ht 70.0 in | Wt 206.0 lb

## 2023-02-07 DIAGNOSIS — G2581 Restless legs syndrome: Secondary | ICD-10-CM

## 2023-02-07 DIAGNOSIS — G894 Chronic pain syndrome: Secondary | ICD-10-CM | POA: Diagnosis not present

## 2023-02-07 DIAGNOSIS — G588 Other specified mononeuropathies: Secondary | ICD-10-CM

## 2023-02-07 DIAGNOSIS — G5792 Unspecified mononeuropathy of left lower limb: Secondary | ICD-10-CM

## 2023-02-07 MED ORDER — ROPINIROLE HCL 2 MG PO TABS
2.0000 mg | ORAL_TABLET | Freq: Every day | ORAL | 2 refills | Status: AC
  Filled 2023-02-07: qty 30, 30d supply, fill #0
  Filled 2023-02-26 – 2023-04-05 (×2): qty 30, 30d supply, fill #1

## 2023-02-07 MED ORDER — OXYCODONE-ACETAMINOPHEN 10-325 MG PO TABS
1.0000 | ORAL_TABLET | Freq: Two times a day (BID) | ORAL | 0 refills | Status: DC | PRN
  Filled 2023-02-07: qty 60, 30d supply, fill #0

## 2023-02-07 NOTE — Patient Instructions (Signed)
______________________________________________________________________    Preparing for your procedure  Appointments: If you think you may not be able to keep your appointment, call 24-48 hours in advance to cancel. We need time to make it available to others.  Procedure visits are for procedures only. During your procedure appointment there will be: NO Prescription Refills*. NO medication changes or discussions*. NO discussion of disability issues*. NO unrelated pain problem evaluations*. NO evaluations to order other pain procedures*. *These will be addressed at a separate and distinct evaluation encounter on the provider's evaluation schedule and not during procedure days.  Instructions: Food intake: Avoid eating anything solid for at least 8 hours prior to your procedure. Clear liquid intake: You may take clear liquids such as water up to 2 hours prior to your procedure. (No carbonated drinks. No soda.) Transportation: Unless otherwise stated by your physician, bring a driver. (Driver cannot be a Market researcher, Pharmacist, community, or any other form of public transportation.) Morning Medicines: Except for blood thinners, take all of your other morning medications with a sip of water. Make sure to take your heart and blood pressure medicines. If your blood pressure's lower number is above 100, the case will be rescheduled. Blood thinners: Make sure to stop your blood thinners as instructed.  If you take a blood thinner, but were not instructed to stop it, call our office [phone] and ask to talk to a nurse. Not stopping a blood thinner prior to certain procedures could lead to serious complications. Diabetics on insulin: Notify the staff so that you can be scheduled 1st case in the morning. If your diabetes requires high dose insulin, take only  of your normal insulin dose the morning of the procedure and notify the staff that you have done so. Preventing infections: Shower with an antibacterial soap the  morning of your procedure.  Build-up your immune system: Take 1000 mg of Vitamin C with every meal (3 times a day) the day prior to your procedure. Antibiotics: Inform the nursing staff if you are taking any antibiotics or if you have any conditions that may require antibiotics prior to procedures. (Example: recent joint implants)   Pregnancy: If you are pregnant make sure to notify the nursing staff. Not doing so may result in injury to the fetus, including death.  Sickness: If you have a cold, fever, or any active infections, call and cancel or reschedule your procedure. Receiving steroids while having an infection may result in complications. Arrival: You must be in the facility at least 30 minutes prior to your scheduled procedure. Tardiness: Your scheduled time is also the cutoff time. If you do not arrive at least 15 minutes prior to your procedure, you will be rescheduled.  Children: Do not bring any children with you. Make arrangements to keep them home. Dress appropriately: There is always a possibility that your clothing may get soiled. Avoid long dresses. Valuables: Do not bring any jewelry or valuables.  Reasons to call and reschedule or cancel your procedure: (Following these recommendations will minimize the risk of a serious complication.) Surgeries: Avoid having procedures within 2 weeks of any surgery. (Avoid for 2 weeks before or after any surgery). Flu Shots: Avoid having procedures within 2 weeks of a flu shots or . (Avoid for 2 weeks before or after immunizations). Barium: Avoid having a procedure within 7-10 days after having had a radiological study involving the use of radiological contrast. (Myelograms, Barium swallow or enema study). Heart attacks: Avoid any elective procedures or surgeries for the  initial 6 months after a "Myocardial Infarction" (Heart Attack). Blood thinners: It is imperative that you stop these medications before procedures. Let us know if you if you take  any blood thinner.  Infection: Avoid procedures during or within two weeks of an infection (including chest colds or gastrointestinal problems). Symptoms associated with infections include: Localized redness, fever, chills, night sweats or profuse sweating, burning sensation when voiding, cough, congestion, stuffiness, runny nose, sore throat, diarrhea, nausea, vomiting, cold or Flu symptoms, recent or current infections. It is specially important if the infection is over the area that we intend to treat. Heart and lung problems: Symptoms that may suggest an active cardiopulmonary problem include: cough, chest pain, breathing difficulties or shortness of breath, dizziness, ankle swelling, uncontrolled high or unusually low blood pressure, and/or palpitations. If you are experiencing any of these symptoms, cancel your procedure and contact your primary care physician for an evaluation.  Remember:  Regular Business hours are:  Monday to Thursday 8:00 AM to 4:00 PM  Provider's Schedule: Delano Metz, MD:  Procedure days: Tuesday and Thursday 7:30 AM to 4:00 PM  Edward Jolly, MD:  Procedure days: Monday and Wednesday 7:30 AM to 4:00 PM Last  Updated: 12/25/2022 ______________________________________________________________________      ______________________________________________________________________    General Risks and Possible Complications  Patient Responsibilities: It is important that you read this as it is part of your informed consent. It is our duty to inform you of the risks and possible complications associated with treatments offered to you. It is your responsibility as a patient to read this and to ask questions about anything that is not clear or that you believe was not covered in this document.  Patient's Rights: You have the right to refuse treatment. You also have the right to change your mind, even after initially having agreed to have the treatment done. However,  under this last option, if you wait until the last second to change your mind, you may be charged for the materials used up to that point.  Introduction: Medicine is not an Visual merchandiser. Everything in Medicine, including the lack of treatment(s), carries the potential for danger, harm, or loss (which is by definition: Risk). In Medicine, a complication is a secondary problem, condition, or disease that can aggravate an already existing one. All treatments carry the risk of possible complications. The fact that a side effects or complications occurs, does not imply that the treatment was conducted incorrectly. It must be clearly understood that these can happen even when everything is done following the highest safety standards.  No treatment: You can choose not to proceed with the proposed treatment alternative. The "PRO(s)" would include: avoiding the risk of complications associated with the therapy. The "CON(s)" would include: not getting any of the treatment benefits. These benefits fall under one of three categories: diagnostic; therapeutic; and/or palliative. Diagnostic benefits include: getting information which can ultimately lead to improvement of the disease or symptom(s). Therapeutic benefits are those associated with the successful treatment of the disease. Finally, palliative benefits are those related to the decrease of the primary symptoms, without necessarily curing the condition (example: decreasing the pain from a flare-up of a chronic condition, such as incurable terminal cancer).  General Risks and Complications: These are associated to most interventional treatments. They can occur alone, or in combination. They fall under one of the following six (6) categories: no benefit or worsening of symptoms; bleeding; infection; nerve damage; allergic reactions; and/or death. No benefits or worsening  of symptoms: In Medicine there are no guarantees, only probabilities. No healthcare provider can  ever guarantee that a medical treatment will work, they can only state the probability that it may. Furthermore, there is always the possibility that the condition may worsen, either directly, or indirectly, as a consequence of the treatment. Bleeding: This is more common if the patient is taking a blood thinner, either prescription or over the counter (example: Goody Powders, Fish oil, Aspirin, Garlic, etc.), or if suffering a condition associated with impaired coagulation (example: Hemophilia, cirrhosis of the liver, low platelet counts, etc.). However, even if you do not have one on these, it can still happen. If you have any of these conditions, or take one of these drugs, make sure to notify your treating physician. Infection: This is more common in patients with a compromised immune system, either due to disease (example: diabetes, cancer, human immunodeficiency virus [HIV], etc.), or due to medications or treatments (example: therapies used to treat cancer and rheumatological diseases). However, even if you do not have one on these, it can still happen. If you have any of these conditions, or take one of these drugs, make sure to notify your treating physician. Nerve Damage: This is more common when the treatment is an invasive one, but it can also happen with the use of medications, such as those used in the treatment of cancer. The damage can occur to small secondary nerves, or to large primary ones, such as those in the spinal cord and brain. This damage may be temporary or permanent and it may lead to impairments that can range from temporary numbness to permanent paralysis and/or brain death. Allergic Reactions: Any time a substance or material comes in contact with our body, there is the possibility of an allergic reaction. These can range from a mild skin rash (contact dermatitis) to a severe systemic reaction (anaphylactic reaction), which can result in death. Death: In general, any medical  intervention can result in death, most of the time due to an unforeseen complication. ______________________________________________________________________

## 2023-02-07 NOTE — Progress Notes (Signed)
Nursing Pain Medication Assessment:  Safety precautions to be maintained throughout the outpatient stay will include: orient to surroundings, keep bed in low position, maintain call bell within reach at all times, provide assistance with transfer out of bed and ambulation.  Medication Inspection Compliance: Darren Miller did not comply with our request to bring his pills to be counted. He was reminded that bringing the medication bottles, even when empty, is a requirement.  Medication: None brought in. Pill/Patch Count: None available to be counted. Bottle Appearance: No container available. Did not bring bottle(s) to appointment. Filled Date: N/A Last Medication intake:   02/03/23  Pt states Butrans too expensive and so has never picked up from pharmacy.  Pt states after 12/2022 surgery, pt discarded all remaining pain meds including oxycodone and lyrica, among others that pt states he cannot recall the names of.Pt states "they weren't doing any good."

## 2023-02-07 NOTE — Progress Notes (Signed)
PROVIDER NOTE: Information contained herein reflects review and annotations entered in association with encounter. Interpretation of such information and data should be left to medically-trained personnel. Information provided to patient can be located elsewhere in the medical record under "Patient Instructions". Document created using STT-dictation technology, any transcriptional errors that may result from process are unintentional.    Patient: Darren Miller  Service Category: E/M  Provider: Edward Jolly, MD  DOB: [birth year]  DOS: 02/07/2023  Referring Provider: Marjie Skiff, NP  MRN: [id number]  Specialty: Interventional Pain Management  PCP: Marjie Skiff, NP  Type: Established Patient  Setting: Ambulatory outpatient    Location: Office  Delivery: Face-to-face     HPI  [name], a 38 y.o. 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 Darren Miller primary complain today is Abdominal Pain  Pertinent problems: Darren Miller has Post traumatic stress disorder (PTSD); Ilioinguinal neuralgia of left side; Abdominal pain, left lower quadrant; Chronic pain syndrome; Controlled substance agreement signed; S/P repair of ventral hernia; Penile erection impairment; and Genitofemoral neuralgia of left side on their pertinent problem list. Pain Assessment: Severity of Chronic pain is reported as a 6 /10. Location: Abdomen Right, Left/ . Onset: More than a month ago. Quality: Sharp. Timing: Constant. Modifying factor(s): nothing. Vitals:  height is 5\' 10"  (1.778 m) and weight is 206 lb (93.4 kg). His temperature is 98 F (36.7 C). His blood pressure is 113/81 and his pulse is 93. His respiration is 16 and oxygen saturation is 99%.  BMI: Estimated body mass index is 29.56 kg/m as calculated from the following:   Height as of this encounter: 5\' 10"  (1.778 m).   Weight as of this encounter: 206 lb (93.4 kg). Last encounter: 12/18/2022. Last  procedure: 11/26/2022.  Reason for encounter: medication management.  Discussed the use of AI scribe software for clinical note transcription with the patient, who gave verbal consent to proceed.  History of Present Illness   The patient, with a history of abdominal hernia and bowel issues, underwent hernia repair and bowel resection approximately a month ago. He reports having only about twenty inches of colon left, with some small intestine also removed. The patient experienced significant discomfort and pain post-operatively, particularly as the body adjusted to the changes. Despite the extensive surgery, he was fortunate to avoid the need for an ostomy.  The patient describes a significant event on Christmas Day, where he experienced severe abdominal pain and an inability to pass stool despite taking stool softeners. He describes the sensation of a gas bubble passing, which he believes was the moment his bowels tore open, relieving the pressure. This led to another hospital admission and surgery.  The patient has tried Butrans patches for pain management, but found them ineffective and too expensive to continue. He expresses frustration and uncertainty about how to manage his pain, which is primarily abdominal. He has previously tried a nerve block, but it provided only minimal and temporary relief.  In addition to the abdominal pain, the patient reports a constant sensation of his left testicle being crushed. He describes this pain as being triggered by even the slightest touch or movement. He also reports a constant twitching in his legs, which he describes as being so severe it keeps him awake at night.  Despite the significant discomfort and challenges, the patient maintains a positive attitude and expresses a desire to find a solution to his pain.  Pharmacotherapy Assessment  Analgesic: Percocet 10 mg twice daily as needed  Monitoring: [place] PMP: PDMP reviewed during this encounter.        Pharmacotherapy: No side-effects or adverse reactions reported. Compliance: No problems identified. Effectiveness: Clinically acceptable.  Nonah Mattes, RN  02/07/2023 11:26 AM  Sign when Signing Visit Nursing Pain Medication Assessment:  Safety precautions to be maintained throughout the outpatient stay will include: orient to surroundings, keep bed in low position, maintain call bell within reach at all times, provide assistance with transfer out of bed and ambulation.  Medication Inspection Compliance: Darren Miller did not comply with our request to bring his pills to be counted. He was reminded that bringing the medication bottles, even when empty, is a requirement.  Medication: None brought in. Pill/Patch Count: None available to be counted. Bottle Appearance: No container available. Did not bring bottle(s) to appointment. Filled Date: N/A Last Medication intake:   02/03/23  Pt states Butrans too expensive and so has never picked up from pharmacy.  Pt states after 12/2022 surgery, pt discarded all remaining pain meds including oxycodone and lyrica, among others that pt states he cannot recall the names of.Pt states "they weren't doing any good."  No results found for: "CBDTHCR" No results found for: "D8THCCBX" No results found for: "D9THCCBX"  UDS:  No results found for: "SUMMARY"    ROS  Constitutional: Denies any fever or chills Gastrointestinal: No reported hemesis, hematochezia, vomiting, or acute GI distress Musculoskeletal:  as above Neurological: No reported episodes of acute onset apraxia, aphasia, dysarthria, agnosia, amnesia, paralysis, loss of coordination, or loss of consciousness  Medication Review  Crisaborole, Medi-First Triple Antibiotic, Oxycodone HCl, SUMAtriptan, acetaminophen, albuterol, amoxicillin-clavulanate, amphetamine-dextroamphetamine, bacitracin, baclofen, bisacodyl, buPROPion, buprenorphine, celecoxib, diphenoxylate-atropine, famotidine,  magnesium hydroxide, methocarbamol, ondansetron, oxyCODONE-acetaminophen, pantoprazole, prazosin, pregabalin, rOPINIRole, simethicone, tadalafil, traZODone, triamcinolone cream, and zolpidem  History Review  Allergy: Darren Miller is allergic to morphine, silicone, and tape. Drug: Darren Miller  reports current drug use. Drug: Marijuana. Alcohol:  reports that he does not currently use alcohol after a past usage of about 14.0 standard drinks of alcohol per week. Tobacco:  reports that he has quit smoking. His smoking use included cigarettes. He has never used smokeless tobacco. Social: Darren Miller  reports that he has quit smoking. His smoking use included cigarettes. He has never used smokeless tobacco. He reports that he does not currently use alcohol after a past usage of about 14.0 standard drinks of alcohol per week. He reports current drug use. Drug: Marijuana. Medical:  has a past medical history of ADHD (attention deficit hyperactivity disorder), Allergy to alpha-gal, Anxiety, Depression, and GERD (gastroesophageal reflux disease). Surgical: Darren Miller  has a past surgical history that includes Appendectomy; Colonoscopy with propofol (N/A, 11/19/2017); Esophagogastroduodenoscopy (egd) with propofol (N/A, 11/19/2017); and Knee arthroscopy with meniscal repair (Right, 07/16/2019). Family: family history includes ADD / ADHD in his paternal aunt and paternal uncle; Alcohol abuse in his father, paternal grandfather, paternal grandmother, and paternal uncle; Thyroid disease in his mother.  Laboratory Chemistry Profile   Renal Lab Results  Component Value Date   BUN 23 (H) 01/29/2023   CREATININE 1.00 01/29/2023   LABCREA 11.1 (L) 10/30/2022   BCR 23 (H) 01/29/2023   GFRAA >60 07/20/2019   GFRNONAA >60 02/04/2021    Hepatic Lab Results  Component Value Date   AST 26 01/29/2023   ALT 35 01/29/2023   ALBUMIN 4.3 01/29/2023   ALKPHOS 182 (H) 01/29/2023    Electrolytes Lab Results  Component  Value Date   NA 132 (L) 01/29/2023   K 4.8 01/29/2023   CL 97 01/29/2023   CALCIUM 9.5 01/29/2023   MG 2.1 02/04/2021    Bone No results found for: "VD25OH", "VD125OH2TOT", "IO2703JK0", "XF8182XH3", "25OHVITD1", "25OHVITD2", "25OHVITD3", "TESTOFREE", "TESTOSTERONE"  Inflammation (CRP: Acute Phase) (ESR: Chronic Phase) Lab Results  Component Value Date   LATICACIDVEN 1.3 02/02/2021         Note: Above Lab results reviewed.  Recent Imaging Review  CT HEAD WO CONTRAST ( ) CLINICAL DATA:  Headache  EXAM: CT HEAD WITHOUT CONTRAST  TECHNIQUE: Contiguous axial images were obtained from the base of the skull through the vertex without intravenous contrast.  RADIATION DOSE REDUCTION: This exam was performed according to the departmental dose-optimization program which includes automated exposure control, adjustment of the mA and/or kV according to patient size and/or use of iterative reconstruction technique.  COMPARISON:  None.  FINDINGS: Brain: No evidence of acute infarction, hemorrhage, hydrocephalus, extra-axial collection or mass lesion/mass effect.  Vascular: Negative for hyperdense vessel  Skull: Negative  Sinuses/Orbits: Paranasal sinuses clear.  Negative orbit  Other: None  IMPRESSION: Negative CT head  Electronically Signed   By: Marlan Palau M.D.   On: 02/04/2021 17:37 Note: Reviewed        Physical Exam  General appearance: Well nourished, well developed, and well hydrated. In no apparent acute distress Mental status: Alert, oriented x 3 (person, place, & time)       Respiratory: No evidence of acute respiratory distress Eyes: PERLA Vitals: BP 113/81   Pulse 93   Temp 98 F (36.7 C)   Resp 16   Ht 5\' 10"  (1.778 m)   Wt 206 lb (93.4 kg)   SpO2 99%   BMI 29.56 kg/m  BMI: Estimated body mass index is 29.56 kg/m as calculated from the following:   Height as of this encounter: 5\' 10"  (1.778 m).   Weight as of this encounter: 206 lb (93.4  kg). Ideal: Ideal body weight: 73 kg (160 lb 15 oz) Adjusted ideal body weight: 81.2 kg (178 lb 15.4 oz)  Left abdominal, left testicular pain  Assessment   Diagnosis  1. Ilioinguinal neuralgia of left side   2. Genitofemoral neuralgia of left side   3. Chronic pain syndrome   4. Restless leg syndrome      Updated Problems: Problem  Genitofemoral Neuralgia of Left Side    Plan of Care  Problem-specific:  Assessment and Plan    Chronic Abdominal Pain Post-Hernia Repair and Bowel Resection   Chronic abdominal pain persists following hernia repair and extensive bowel resection, with 20 inches of colon and some small intestine removed. The pain is severe and exacerbated by anatomical changes and nerve damage. Previous treatments, including nerve blocks and Butrans patches, were ineffective or unaffordable. Preferring non-opioid pain management, he is open to medications. Limited options, potential benefits, and risks of opioid use, such as tolerance and constipation, were discussed, along with the ilioinguinal nerve block. . Administer  IL nerve block and prescribe Percocet 10 mg twice daily. Advise Tylenol as needed, not exceeding 2 grams per day. Follow up in two weeks for the nerve block.  Restless Leg Syndrome   New onset of leg twitching at night is affecting sleep, possibly related to recent surgeries or underlying conditions. Robaxin was ineffective. Prescribe Requip.  General Health Maintenance   Significant weight loss of 30 pounds since hernia repair and bowel resection. Monitor weight and nutritional intake to  maintain overall health.       Darren Miller has a current medication list which includes the following long-term medication(s): albuterol, amphetamine-dextroamphetamine, [START ON 03/01/2023] amphetamine-dextroamphetamine, [START ON 03/29/2023] amphetamine-dextroamphetamine, bupropion, famotidine, pantoprazole, prazosin, pregabalin, ropinirole, simethicone,  sumatriptan, tadalafil, trazodone, and zolpidem.  Pharmacotherapy (Medications Ordered): Meds ordered this encounter  Medications   oxyCODONE-acetaminophen (PERCOCET) 10-325 MG tablet    Sig: Take 1 tablet by mouth every 12 (twelve) hours as needed for pain. Must last 30 days.    Dispense:  60 tablet    Refill:  0    Chronic Pain: STOP Act (Not applicable) Fill 1 day early if closed on refill date. Avoid benzodiazepines within 8 hours of opioids   rOPINIRole (REQUIP) 2 MG tablet    Sig: Take 1 tablet (2 mg total) by mouth at bedtime.    Dispense:  30 tablet    Refill:  2   Orders:  Orders Placed This Encounter  Procedures   ILIONINGUINAL NERVE BLOCK    Standing Status:   Future    Expected Date:   02/20/2023    Expiration Date:   05/08/2023    Scheduling Instructions:     Side: LEFT     Sedation: PO Valium 10 mg    Where will this procedure be performed?:   [facility]   Follow-up plan:   Return in about 13 days (around 02/20/2023) for left Ilioinguinal NB , in clinic (PO Valium 10mg ).     Recent Visits Date Type Provider Dept  12/18/22 Office Visit Edward Jolly, MD [facility]  11/26/22 Procedure visit Edward Jolly, MD [facility]  Showing recent visits within past 90 days and meeting all other requirements Today's Visits Date Type Provider Dept  02/07/23 Office Visit Edward Jolly, MD [facility]  Showing today's visits and meeting all other requirements Future Appointments Date Type Provider Dept  02/25/23 Appointment Edward Jolly, MD [facility]  Showing future appointments within next 90 days and meeting all other requirements  I discussed the assessment and treatment plan with the patient. The patient was provided an opportunity to ask questions and all were answered. The patient agreed with the plan and demonstrated an understanding of the instructions.  Patient advised to call back or seek an in-person evaluation if  the symptoms or condition worsens.  Duration of encounter: .  Total time on encounter, as per AMA guidelines included both the face-to-face and non-face-to-face time personally spent by the physician and/or other qualified health care professional(s) on the day of the encounter (includes time in activities that require the physician or other qualified health care professional and does not include time in activities normally performed by clinical staff). Physician's time may include the following activities when performed: Preparing to see the patient (e.g., pre-charting review of records, searching for previously ordered imaging, lab work, and nerve conduction tests) Review of prior analgesic pharmacotherapies. Reviewing PMP Interpreting ordered tests (e.g., lab work, imaging, nerve conduction tests) Performing post-procedure evaluations, including interpretation of diagnostic procedures Obtaining and/or reviewing separately obtained history Performing a medically appropriate examination and/or evaluation Counseling and educating the patient/family/caregiver Ordering medications, tests, or procedures Referring and communicating with other health care professionals (when not separately reported) Documenting clinical information in the electronic or other health record Independently interpreting results (not separately reported) and communicating results to the patient/ family/caregiver Care coordination (not separately reported)  Note by: Edward Jolly, MD Date: 02/07/2023; Time: 1:11 PM

## 2023-02-25 ENCOUNTER — Ambulatory Visit
Payer: 59 | Attending: Student in an Organized Health Care Education/Training Program | Admitting: Student in an Organized Health Care Education/Training Program

## 2023-02-25 ENCOUNTER — Other Ambulatory Visit: Payer: Self-pay

## 2023-02-25 ENCOUNTER — Encounter: Payer: Self-pay | Admitting: Student in an Organized Health Care Education/Training Program

## 2023-02-25 VITALS — BP 130/74 | HR 74 | Temp 98.1°F | Resp 16 | Ht 70.0 in | Wt 217.0 lb

## 2023-02-25 DIAGNOSIS — G894 Chronic pain syndrome: Secondary | ICD-10-CM | POA: Insufficient documentation

## 2023-02-25 DIAGNOSIS — G588 Other specified mononeuropathies: Secondary | ICD-10-CM | POA: Diagnosis not present

## 2023-02-25 DIAGNOSIS — G5792 Unspecified mononeuropathy of left lower limb: Secondary | ICD-10-CM | POA: Insufficient documentation

## 2023-02-25 MED ORDER — SODIUM CHLORIDE (PF) 0.9 % IJ SOLN
INTRAMUSCULAR | Status: AC
  Filled 2023-02-25: qty 10

## 2023-02-25 MED ORDER — DIAZEPAM 5 MG PO TABS
ORAL_TABLET | ORAL | Status: AC
  Filled 2023-02-25: qty 2

## 2023-02-25 MED ORDER — DIAZEPAM 5 MG PO TABS
10.0000 mg | ORAL_TABLET | ORAL | Status: AC
  Administered 2023-02-25: 10 mg via ORAL

## 2023-02-25 MED ORDER — DIAZEPAM 5 MG PO TABS: 10.0000 mg | ORAL_TABLET | ORAL | Status: DC

## 2023-02-25 MED ORDER — OXYCODONE-ACETAMINOPHEN 10-325 MG PO TABS
1.0000 | ORAL_TABLET | Freq: Two times a day (BID) | ORAL | 0 refills | Status: AC | PRN
Start: 2023-03-08 — End: 2023-04-07
  Filled 2023-03-08: qty 60, 30d supply, fill #0

## 2023-02-25 MED ORDER — ROPIVACAINE HCL 2 MG/ML IJ SOLN
18.0000 mL | Freq: Once | INTRAMUSCULAR | Status: AC
  Administered 2023-02-25: 18 mL via PERINEURAL
  Filled 2023-02-25: qty 20

## 2023-02-25 MED ORDER — OXYCODONE-ACETAMINOPHEN 10-325 MG PO TABS
1.0000 | ORAL_TABLET | Freq: Two times a day (BID) | ORAL | 0 refills | Status: DC | PRN
  Filled 2023-04-08: qty 60, 30d supply, fill #0

## 2023-02-25 MED ORDER — DEXAMETHASONE SODIUM PHOSPHATE 10 MG/ML IJ SOLN
20.0000 mg | Freq: Once | INTRAMUSCULAR | Status: AC
  Administered 2023-02-25: 20 mg
  Filled 2023-02-25: qty 2

## 2023-02-25 NOTE — Patient Instructions (Signed)

## 2023-02-25 NOTE — Progress Notes (Signed)
 PROVIDER NOTE: Interpretation of information contained herein should be left to medically-trained personnel. Specific patient instructions are provided elsewhere under "Patient Instructions" section of medical record. This document was created in part using STT-dictation technology, any transcriptional errors that may result from this process are unintentional.  Patient: Darren Miller Type: Established DOB: 1985/07/01 MRN: [id number] PCP: Lemar Pyles, NP  Service: Procedure DOS: 02/25/2023 Setting: Ambulatory Location: Ambulatory outpatient facility Delivery: Face-to-face Provider: Cephus Collin, MD Specialty: Interventional Pain Management Specialty designation: 09 Location: Outpatient facility Ref. Prov.: Cannady, Jolene T, NP       Interventional Therapy   Primary Reason for Visit: Interventional Pain Management Treatment. CC: Abdominal Pain (Left lower quadrant)    Procedure:          Anesthesia, Analgesia, Anxiolysis:  Type:  Ilioinguinal  Nerve Block #2  Primary Purpose: Diagnostic Region: ASIS, Groin Region Target Area: Lateral one third of the line between the left ASIS and left umbilicus Approach: Anterior approach Laterality: Left  Anesthesia: Local (1-2% Lidocaine )  Anxiolysis: Oral Valium  10 mg Guidance: Ultrasound           Position: Supine   1. Ilioinguinal neuralgia of left side   2. Genitofemoral neuralgia of left side   3. Chronic pain syndrome    NAS-11 Pain score:   Pre-procedure: 6 /10   Post-procedure: 5 /10     H&P (Pre-op Assessment):  Darren Miller is a 38 y.o. (year old), male patient, seen today for interventional treatment. He  has a past surgical history that includes Appendectomy; Colonoscopy with propofol  (N/A, 11/19/2017); Esophagogastroduodenoscopy (egd) with propofol  (N/A, 11/19/2017); and Knee arthroscopy with meniscal repair (Right, 07/16/2019). Darren Miller has a current medication list which includes the following prescription(s):  acetaminophen , albuterol , amphetamine -dextroamphetamine , [START ON 03/01/2023] amphetamine -dextroamphetamine , [START ON 03/29/2023] amphetamine -dextroamphetamine , bacitracin , baclofen , bisacodyl , bupropion , celecoxib , ciprofloxacin, eucrisa , diphenoxylate -atropine , famotidine , methocarbamol , medi-first triple antibiotic, oxycodone  hcl, pantoprazole , prazosin , pregabalin , ropinirole , simethicone , sumatriptan , tadalafil , trazodone , triamcinolone  cream, zolpidem , [START ON 03/09/2023] oxycodone -acetaminophen , and [START ON 04/08/2023] oxycodone -acetaminophen , and the following Facility-Administered Medications: diazepam . His primarily concern today is the Abdominal Pain (Left lower quadrant)  Initial Vital Signs:  Pulse/HCG Rate: 80  Temp: 98.1 F (36.7 C) Resp: 20 BP: 131/86 SpO2: 99 %  BMI: Estimated body mass index is 31.14 kg/m as calculated from the following:   Height as of this encounter: 5\' 10"  (1.778 m).   Weight as of this encounter: 217 lb (98.4 kg).  Risk Assessment: Allergies: Reviewed. He is allergic to morphine , silicone, and tape.  Allergy Precautions: None required Coagulopathies: Reviewed. None identified.  Blood-thinner therapy: None at this time Active Infection(s): Reviewed. None identified. Darren Miller is afebrile  Site Confirmation: Darren Miller was asked to confirm the procedure and laterality before marking the site Procedure checklist: Completed Consent: Before the procedure and under the influence of no sedative(s), amnesic(s), or anxiolytics, the patient was informed of the treatment options, risks and possible complications. To fulfill our ethical and legal obligations, as recommended by the American Medical Association's Code of Ethics, I have informed the patient of my clinical impression; the nature and purpose of the treatment or procedure; the risks, benefits, and possible complications of the intervention; the alternatives, including doing nothing; the risk(s) and  benefit(s) of the alternative treatment(s) or procedure(s); and the risk(s) and benefit(s) of doing nothing. The patient was provided information about the general risks and possible complications associated with the procedure. These may include, but are not limited to: failure to achieve desired goals,  infection, bleeding, organ or nerve damage, allergic reactions, paralysis, and death. In addition, the patient was informed of those risks and complications associated to the procedure, such as failure to decrease pain; infection; bleeding; organ or nerve damage with subsequent damage to sensory, motor, and/or autonomic systems, resulting in permanent pain, numbness, and/or weakness of one or several areas of the body; allergic reactions; (i.e.: anaphylactic reaction); and/or death. Furthermore, the patient was informed of those risks and complications associated with the medications. These include, but are not limited to: allergic reactions (i.e.: anaphylactic or anaphylactoid reaction(s)); adrenal axis suppression; blood sugar elevation that in diabetics may result in ketoacidosis or comma; water retention that in patients with history of congestive heart failure may result in shortness of breath, pulmonary edema, and decompensation with resultant heart failure; weight gain; swelling or edema; medication-induced neural toxicity; particulate matter embolism and blood vessel occlusion with resultant organ, and/or nervous system infarction; and/or aseptic necrosis of one or more joints. Finally, the patient was informed that Medicine is not an exact science; therefore, there is also the possibility of unforeseen or unpredictable risks and/or possible complications that may result in a catastrophic outcome. The patient indicated having understood very clearly. We have given the patient no guarantees and we have made no promises. Enough time was given to the patient to ask questions, all of which were answered to  the patient's satisfaction. Darren Miller has indicated that he wanted to continue with the procedure. Attestation: I, the ordering provider, attest that I have discussed with the patient the benefits, risks, side-effects, alternatives, likelihood of achieving goals, and potential problems during recovery for the procedure that I have provided informed consent. Date  Time: 02/25/2023 10:29 AM  Pre-Procedure Preparation:  Monitoring: As per clinic protocol. Respiration, ETCO2, SpO2, BP, heart rate and rhythm monitor placed and checked for adequate function Safety Precautions: Patient was assessed for positional comfort and pressure points before starting the procedure. Time-out: I initiated and conducted the "Time-out" before starting the procedure, as per protocol. The patient was asked to participate by confirming the accuracy of the "Time Out" information. Verification of the correct person, site, and procedure were performed and confirmed by me, the nursing staff, and the patient. "Time-out" conducted as per Joint Commission's Universal Protocol (UP.01.01.01). Time: 1118 Start Time: 1118 hrs.  Description of Procedure:          Ultrasound Setup: The ultrasound machine was prepared and set to a linear high-frequency transducer with an appropriate frequency for soft tissue imaging. The probe was covered with sterile gel and draped with a sterile cover.  Identification of Target Structures: The ilioinguinal nerve was located using ultrasound guidance. The nerve was identified as it runs along the anterior abdominal wall, medial and slightly inferior to the anterior superior iliac spine  Needle Insertion: A sterile 22-gauge, 50mm needle was introduced under real-time ultrasound guidance. The needle was advanced in a plane parallel to the transducer, ensuring it was directed toward the ilioinguinal nerve. Aspiration was performed to ensure no vascular structures were punctured.  Injection of Local  Anesthetic: Once the needle tip was confirmed to be in close proximity to the ilioinguinal nerve, 20 cc solution made of 19cc of 0.2% ropivacaine , 1 cc of Decadron  10 mg/cc was injected under continuous ultrasound visualization. The spread of the local anesthetic was monitored to ensure adequate coverage of the target nerve.  Confirmation of Block: The spread of the anesthetic around the ilioinguinal nerve was confirmed by the change in  tissue echogenicity. No complications were noted during the injection process.  Vitals:   02/25/23 1103 02/25/23 1113 02/25/23 1120 02/25/23 1130  BP: 126/83 119/74 121/76 130/74  Pulse: 81 79 76 74  Resp: 20 15 16 16   Temp:      SpO2: 99% 99% 99% 99%  Weight:      Height:        Start Time: 1118 hrs. End Time: 1130 hrs.      Materials:  Needle(s) Type:  Ultrasound PAJUNK needle Gauge: 22G Length: 3.5-in   Imaging Guidance (Non-Spinal):          Type of Imaging Technique: Fluoroscopy Guidance (Non-Spinal) Indication(s): Fluoroscopy guidance for needle placement to enhance accuracy in procedures requiring precise needle localization for targeted delivery of medication in or near specific anatomical locations not easily accessible without such real-time imaging assistance. Exposure Time: Please see nurses notes. Contrast: Before injecting any contrast, we confirmed that the patient did not have an allergy to iodine, shellfish, or radiological contrast. Once satisfactory needle placement was completed at the desired level, radiological contrast was injected. Contrast injected under live fluoroscopy. No contrast complications. See chart for type and volume of contrast used. Fluoroscopic Guidance: I was personally present during the use of fluoroscopy. "Tunnel Vision Technique" used to obtain the best possible view of the target area. Parallax error corrected before commencing the procedure. "Direction-depth-direction" technique used to introduce the  needle under continuous pulsed fluoroscopy. Once target was reached, antero-posterior, oblique, and lateral fluoroscopic projection used confirm needle placement in all planes. Images permanently stored in EMR. Interpretation: I personally interpreted the imaging intraoperatively. Adequate needle placement confirmed in multiple planes. Appropriate spread of contrast into desired area was observed. No evidence of afferent or efferent intravascular uptake. Permanent images saved into the patient's record.  Antibiotic Prophylaxis:   Anti-infectives (From admission, onward)    None      Indication(s): None identified  Post-operative Assessment:  Post-procedure Vital Signs:  Pulse/HCG Rate: 74  Temp: 98.1 F (36.7 C) Resp: 16 BP: 130/74 SpO2: 99 %  EBL: None  Complications: No immediate post-treatment complications observed by team, or reported by patient.  Note: The patient tolerated the entire procedure well. A repeat set of vitals were taken after the procedure and the patient was kept under observation following institutional policy, for this type of procedure. Post-procedural neurological assessment was performed, showing return to baseline, prior to discharge. The patient was provided with post-procedure discharge instructions, including a section on how to identify potential problems. Should any problems arise concerning this procedure, the patient was given instructions to immediately contact us , at any time, without hesitation. In any case, we plan to contact the patient by telephone for a follow-up status report regarding this interventional procedure.  Comments:  No additional relevant information.  Plan of Care (POC)  Orders:  No orders of the defined types were placed in this encounter.   Pharmacotherapy Assessment  Analgesic: Percocet 10 mg BID prn  Monitoring: [place] PMP: PDMP reviewed during this encounter.       Pharmacotherapy: No side-effects or adverse reactions  reported. Compliance: No problems identified. Effectiveness: Clinically acceptable.  UDS: No results found for: "SUMMARY"  Patient is requesting to increase his oxycodone  so that he can have a midday dose.  I do not recommend this.  We have already escalated his opioid analgesics since he started seeing me.  Hopefully he will get better relief with his 2nd nerve block since we added additional volume.  Medications ordered for procedure: Meds ordered this encounter  Medications   ropivacaine  (PF) 2 mg/mL (0.2%) (NAROPIN ) injection 18 mL   dexamethasone  (DECADRON ) injection 20 mg   diazepam  (VALIUM ) tablet 10 mg    Make sure Flumazenil is available in the pyxis when using this medication. If oversedation occurs, administer 0.2 mg IV over 15 sec. If after 45 sec no response, administer 0.2 mg again over 1 min; may repeat at 1 min intervals; not to exceed 4 doses (1 mg)   diazepam  (VALIUM ) tablet 10 mg    Make sure Flumazenil is available in the pyxis when using this medication. If oversedation occurs, administer 0.2 mg IV over 15 sec. If after 45 sec no response, administer 0.2 mg again over 1 min; may repeat at 1 min intervals; not to exceed 4 doses (1 mg)   oxyCODONE -acetaminophen  (PERCOCET) 10-325 MG tablet    Sig: Take 1 tablet by mouth every 12 (twelve) hours as needed for pain. Must last 30 days.    Dispense:  60 tablet    Refill:  0    Chronic Pain: STOP Act (Not applicable) Fill 1 day early if closed on refill date. Avoid benzodiazepines within 8 hours of opioids   oxyCODONE -acetaminophen  (PERCOCET) 10-325 MG tablet    Sig: Take 1 tablet by mouth every 12 (twelve) hours as needed for pain. Must last 30 days.    Dispense:  60 tablet    Refill:  0    Chronic Pain: STOP Act (Not applicable) Fill 1 day early if closed on refill date. Avoid benzodiazepines within 8 hours of opioids   Medications administered: We administered ropivacaine  (PF) 2 mg/mL (0.2%), dexamethasone , and diazepam .   See the medical record for exact dosing, route, and time of administration.  Follow-up plan:   Return in about 2 months (around 05/07/2023) for MM+ PPE, F2F.     Recent Visits Date Type Provider Dept  02/07/23 Office Visit Cephus Collin, MD [facility]  12/18/22 Office Visit Cephus Collin, MD [facility]  Showing recent visits within past 90 days and meeting all other requirements Today's Visits Date Type Provider Dept  02/25/23 Procedure visit Cephus Collin, MD [facility]  Showing today's visits and meeting all other requirements Future Appointments Date Type Provider Dept  04/30/23 Appointment Cephus Collin, MD [facility]  Showing future appointments within next 90 days and meeting all other requirements  Disposition: Discharge home  Discharge (Date  Time): 02/25/2023; 1137 hrs.   Primary Care Physician: Cannady, Jolene T, NP Location: [facility] Note by: Cephus Collin, MD (TTS technology used. I apologize for any typographical errors that were not detected and corrected.) Date: 02/25/2023; Time: 11:58 AM  Disclaimer:  Medicine is not an Visual merchandiser. The only guarantee in medicine is that nothing is guaranteed. It is important to note that the decision to proceed with this intervention was based on the information collected from the patient. The Data and conclusions were drawn from the patient's questionnaire, the interview, and the physical examination. Because the information was provided in large part by the patient, it cannot be guaranteed that it has not been purposely or unconsciously manipulated. Every effort has been made to obtain as much relevant data as possible for this evaluation. It is important to note that the conclusions that lead to this procedure are derived in large part from the available data. Always take into account that the treatment will also be dependent on availability  of resources and  existing treatment guidelines, considered by other Pain Management Practitioners as being common knowledge and practice, at the time of the intervention. For Medico-Legal purposes, it is also important to point out that variation in procedural techniques and pharmacological choices are the acceptable norm. The indications, contraindications, technique, and results of the above procedure should only be interpreted and judged by a Board-Certified Interventional Pain Specialist with extensive familiarity and expertise in the same exact procedure and technique.

## 2023-02-25 NOTE — Progress Notes (Signed)
 Safety precautions to be maintained throughout the outpatient stay will include: orient to surroundings, keep bed in low position, maintain call bell within reach at all times, provide assistance with transfer out of bed and ambulation.

## 2023-02-26 ENCOUNTER — Other Ambulatory Visit: Payer: Self-pay | Admitting: Nurse Practitioner

## 2023-02-26 ENCOUNTER — Telehealth: Payer: Self-pay

## 2023-02-26 ENCOUNTER — Other Ambulatory Visit: Payer: Self-pay

## 2023-02-26 ENCOUNTER — Telehealth: Payer: Self-pay | Admitting: *Deleted

## 2023-02-26 MED ORDER — SUMATRIPTAN SUCCINATE 100 MG PO TABS
100.0000 mg | ORAL_TABLET | Freq: Once | ORAL | 11 refills | Status: AC
  Filled 2023-02-26: qty 9, 15d supply, fill #0
  Filled 2023-04-05: qty 9, 15d supply, fill #1
  Filled 2023-08-16: qty 9, 15d supply, fill #2
  Filled 2023-09-18: qty 9, 15d supply, fill #3
  Filled 2023-12-17: qty 9, 15d supply, fill #4
  Filled 2024-01-20: qty 9, 15d supply, fill #5

## 2023-02-26 MED ORDER — BUPROPION HCL ER (XL) 150 MG PO TB24
150.0000 mg | ORAL_TABLET | Freq: Every day | ORAL | 11 refills | Status: DC
  Filled 2023-02-26: qty 30, 30d supply, fill #0

## 2023-02-26 MED ORDER — PREGABALIN 75 MG PO CAPS
75.0000 mg | ORAL_CAPSULE | Freq: Three times a day (TID) | ORAL | 11 refills | Status: DC
  Filled 2023-02-26: qty 90, 30d supply, fill #0

## 2023-02-26 NOTE — Telephone Encounter (Signed)
Requested medication (s) are due for refill today: Yes  Requested medication (s) are on the active medication list: Yes  Last refill:  01/29/23  Future visit scheduled: Yes  Notes to clinic:  Unable to refill per protocol, cannot delegate.      Requested Prescriptions  Pending Prescriptions Disp Refills   amphetamine-dextroamphetamine (ADDERALL) 20 MG tablet 60 tablet 0    Sig: Take 1 tablet (20 mg total) by mouth 2 (two) times daily.     Not Delegated - Psychiatry:  Stimulants/ADHD Failed - 02/26/2023 12:28 PM      Failed - This refill cannot be delegated      Passed - Urine Drug Screen completed in last 360 days      Passed - Last BP in normal range    BP Readings from Last 1 Encounters:  02/25/23 130/74         Passed - Last Heart Rate in normal range    Pulse Readings from Last 1 Encounters:  02/25/23 74         Passed - Valid encounter within last 6 months    Recent Outpatient Visits           4 weeks ago MDD (major depressive disorder), recurrent episode, mild (HCC)   [place] [facility] [place], Dover T, NP   4 months ago MDD (major depressive disorder), recurrent episode, mild (HCC)   [place] [facility] [place], Jolene T, NP   4 months ago MDD (major depressive disorder), recurrent episode, mild (HCC)   [place] [facility] [place], Dorie Rank, NP       Future Appointments             In 1 week Cannady, Dorie Rank, NP [place] [facility]

## 2023-02-26 NOTE — Telephone Encounter (Signed)
No issues post-procedure.

## 2023-02-26 NOTE — Telephone Encounter (Signed)
Attempted to call for post procedure follow-up. Message left.

## 2023-02-27 ENCOUNTER — Other Ambulatory Visit: Payer: Self-pay

## 2023-02-27 MED FILL — Methocarbamol Tab 500 MG: ORAL | 30 days supply | Qty: 90 | Fill #0 | Status: AC

## 2023-02-27 NOTE — Telephone Encounter (Signed)
Requested medication (s) are due for refill today - yes/no  Requested medication (s) are on the active medication list -yes  Future visit scheduled -yes  Last refill: amphetamine dextroamphetamine 01/29/23 #60, 03/01/23 #60- should have on file                  Methocarbamol 01/08/23 #90  Notes to clinic: non delegated Rx  Requested Prescriptions  Pending Prescriptions Disp Refills   amphetamine-dextroamphetamine (ADDERALL) 20 MG tablet 60 tablet 0    Sig: Take 1 tablet (20 mg total) by mouth 2 (two) times daily.     Not Delegated - Psychiatry:  Stimulants/ADHD Failed - 02/27/2023 10:53 AM      Failed - This refill cannot be delegated      Passed - Urine Drug Screen completed in last 360 days      Passed - Last BP in normal range    BP Readings from Last 1 Encounters:  02/25/23 130/74         Passed - Last Heart Rate in normal range    Pulse Readings from Last 1 Encounters:  02/25/23 74         Passed - Valid encounter within last 6 months    Recent Outpatient Visits           4 weeks ago MDD (major depressive disorder), recurrent episode, mild (HCC)   [place] [facility] [place], [place] T, NP   4 months ago MDD (major depressive disorder), recurrent episode, mild (HCC)   [place] [facility] [place], Jolene T, NP   4 months ago MDD (major depressive disorder), recurrent episode, mild (HCC)   [place] [facility] [place], [place]re T, NP       Future Appointments             In 6 days Cannady, Corrie Dandy T, NP [place] [facility]             methocarbamol (ROBAXIN) 500 MG tablet 90 tablet 0    Sig: Take 1 tablet (500 mg total) by mouth 3 (three) times daily.     Not Delegated - Analgesics:  Muscle Relaxants Failed - 02/27/2023 10:53 AM      Failed - This refill cannot be delegated      Passed - Valid encounter within last 6 months    Recent Outpatient Visits           4 weeks ago MDD (major  depressive disorder), recurrent episode, mild (HCC)   [place] [facility] [place], Lane T, NP   4 months ago MDD (major depressive disorder), recurrent episode, mild (HCC)   [place] [facility] [place], [place] T, NP   4 months ago MDD (major depressive disorder), recurrent episode, mild (HCC)   [place] [facility] [place], Elliott T, NP       Future Appointments             In 6 days Kelly, Corrie Dandy T, NP [place] [facility]               Requested Prescriptions  Pending Prescriptions Disp Refills   amphetamine-dextroamphetamine (ADDERALL) 20 MG tablet 60 tablet 0    Sig: Take 1 tablet (20 mg total) by mouth 2 (two) times daily.     Not Delegated - Psychiatry:  Stimulants/ADHD Failed - 02/27/2023 10:53 AM      Failed - This refill cannot be delegated  Passed - Urine Drug Screen completed in last 360 days      Passed - Last BP in normal range    BP Readings from Last 1 Encounters:  02/25/23 130/74         Passed - Last Heart Rate in normal range    Pulse Readings from Last 1 Encounters:  02/25/23 74         Passed - Valid encounter within last 6 months    Recent Outpatient Visits           4 weeks ago MDD (major depressive disorder), recurrent episode, mild (HCC)   [place] [facility] [place], Amana T, NP   4 months ago MDD (major depressive disorder), recurrent episode, mild (HCC)   [place] [facility] [place], Jolene T, NP   4 months ago MDD (major depressive disorder), recurrent episode, mild (HCC)   [place] [facility] [place], [place] T, NP       Future Appointments             In 6 days Cannady, Corrie Dandy T, NP [place] [facility]             methocarbamol (ROBAXIN) 500 MG tablet 90 tablet 0    Sig: Take 1 tablet (500 mg total) by mouth 3 (three) times daily.     Not Delegated - Analgesics:  Muscle  Relaxants Failed - 02/27/2023 10:53 AM      Failed - This refill cannot be delegated      Passed - Valid encounter within last 6 months    Recent Outpatient Visits           4 weeks ago MDD (major depressive disorder), recurrent episode, mild (HCC)   [place] [facility] [place], Winslow T, NP   4 months ago MDD (major depressive disorder), recurrent episode, mild (HCC)   [place] [facility] [place], Jolene T, NP   4 months ago MDD (major depressive disorder), recurrent episode, mild (HCC)   [place] [facility] [place], Dorie Rank, NP       Future Appointments             In 6 days Cannady, Dorie Rank, NP  [facility], [facility]

## 2023-02-28 ENCOUNTER — Other Ambulatory Visit: Payer: Self-pay

## 2023-02-28 MED FILL — Amphetamine-Dextroamphetamine Tab 20 MG: ORAL | 30 days supply | Qty: 60 | Fill #0 | Status: AC

## 2023-03-03 NOTE — Patient Instructions (Signed)
 Managing Depression, Adult Depression is a mental health condition that affects your thoughts, feelings, and actions. Being diagnosed with depression can bring you relief if you did not know why you have felt or behaved a certain way. It could also leave you feeling overwhelmed. Finding ways to manage your symptoms can help you feel more positive about your future. How to manage lifestyle changes Being depressed is difficult. Depression can increase the level of everyday stress. Stress can make depression symptoms worse. You may believe your symptoms cannot be managed or will never improve. However, there are many things you can try to help manage your symptoms. There is hope. Managing stress  Stress is your body's reaction to life changes and events, both good and bad. Stress can add to your feelings of depression. Learning to manage your stress can help lessen your feelings of depression. Try some of the following approaches to reducing your stress (stress reduction techniques): Listen to music that you enjoy and that inspires you. Try using a meditation app or take a meditation class. Develop a practice that helps you connect with your spiritual self. Walk in nature, pray, or go to a place of worship. Practice deep breathing. To do this, inhale slowly through your nose. Pause at the top of your inhale for a few seconds and then exhale slowly, letting yourself relax. Repeat this three or four times. Practice yoga to help relax and work your muscles. Choose a stress reduction technique that works for you. These techniques take time and practice to develop. Set aside 5-15 minutes a day to do them. Therapists can offer training in these techniques. Do these things to help manage stress: Keep a journal. Know your limits. Set healthy boundaries for yourself and others, such as saying "no" when you think something is too much. Pay attention to how you react to certain situations. You may not be able to  control everything, but you can change your reaction. Add humor to your life by watching funny movies or shows. Make time for activities that you enjoy and that relax you. Spend less time using electronics, especially at night before bed. The light from screens can make your brain think it is time to get up rather than go to bed.  Medicines Medicines, such as antidepressants, are often a part of treatment for depression. Talk with your pharmacist or health care provider about all the medicines, supplements, and herbal products that you take, their possible side effects, and what medicines and other products are safe to take together. Make sure to report any side effects you may have to your health care provider. Relationships Your health care provider may suggest family therapy, couples therapy, or individual therapy as part of your treatment. How to recognize changes Everyone responds differently to treatment for depression. As you recover from depression, you may start to: Have more interest in doing activities. Feel more hopeful. Have more energy. Eat a more regular amount of food. Have better mental focus. It is important to recognize if your depression is not getting better or is getting worse. The symptoms you had in the beginning may return, such as: Feeling tired. Eating too much or too little. Sleeping too much or too little. Feeling restless, agitated, or hopeless. Trouble focusing or making decisions. Having unexplained aches and pains. Feeling irritable, angry, or aggressive. If you or your family members notice these symptoms coming back, let your health care provider know right away. Follow these instructions at home: Activity Try to  get some form of exercise each day, such as walking. Try yoga, mindfulness, or other stress reduction techniques. Participate in group activities if you are able. Lifestyle Get enough sleep. Cut down on or stop using caffeine, tobacco,  alcohol, and any other harmful substances. Eat a healthy diet that includes plenty of vegetables, fruits, whole grains, low-fat dairy products, and lean protein. Limit foods that are high in solid fats, added sugar, or salt (sodium). General instructions Take over-the-counter and prescription medicines only as told by your health care provider. Keep all follow-up visits. It is important for your health care provider to check on your mood, behavior, and medicines. Your health care provider may need to make changes to your treatment. Where to find support Talking to others  Friends and family members can be sources of support and guidance. Talk to trusted friends or family members about your condition. Explain your symptoms and let them know that you are working with a health care provider to treat your depression. Tell friends and family how they can help. Finances Find mental health providers that fit with your financial situation. Talk with your health care provider if you are worried about access to food, housing, or medicine. Call your insurance company to learn about your co-pays and prescription plan. Where to find more information You can find support in your area from: Anxiety and Depression Association of America (ADAA): adaa.org Mental Health [place]: mentalhealthamerica.net [employer] on Mental Illness: nami.org Contact a health care provider if: You stop taking your antidepressant medicines, and you have any of these symptoms: Nausea. Headache. Light-headedness. Chills and body aches. Not being able to sleep (insomnia). You or your friends and family think your depression is getting worse. Get help right away if: You have thoughts of hurting yourself or others. Get help right away if you feel like you may hurt yourself or others, or have thoughts about taking your own life. Go to your nearest emergency room or: Call 911. Call the National Suicide Prevention Lifeline at  360-042-0264 or 988. This is open 24 hours a day. Text the Crisis Text Line at 952-302-5572. This information is not intended to replace advice given to you by your health care provider. Make sure you discuss any questions you have with your health care provider. Document Revised: 05/09/2021 Document Reviewed: 05/09/2021 Elsevier Patient Education  2024 [employer].

## 2023-03-04 ENCOUNTER — Other Ambulatory Visit: Payer: Self-pay

## 2023-03-04 ENCOUNTER — Other Ambulatory Visit: Payer: Self-pay | Admitting: Nurse Practitioner

## 2023-03-05 ENCOUNTER — Encounter: Payer: Self-pay | Admitting: Nurse Practitioner

## 2023-03-05 ENCOUNTER — Ambulatory Visit: Payer: 59 | Attending: Nurse Practitioner

## 2023-03-05 ENCOUNTER — Ambulatory Visit: Payer: 59 | Admitting: Nurse Practitioner

## 2023-03-05 ENCOUNTER — Other Ambulatory Visit: Payer: Self-pay

## 2023-03-05 VITALS — BP 113/73 | HR 91 | Temp 98.8°F | Wt 221.4 lb

## 2023-03-05 DIAGNOSIS — F4310 Post-traumatic stress disorder, unspecified: Secondary | ICD-10-CM | POA: Diagnosis not present

## 2023-03-05 DIAGNOSIS — F5104 Psychophysiologic insomnia: Secondary | ICD-10-CM

## 2023-03-05 DIAGNOSIS — R000 Tachycardia, unspecified: Secondary | ICD-10-CM

## 2023-03-05 DIAGNOSIS — F330 Major depressive disorder, recurrent, mild: Secondary | ICD-10-CM | POA: Diagnosis not present

## 2023-03-05 MED ORDER — PANTOPRAZOLE SODIUM 40 MG PO TBEC
40.0000 mg | DELAYED_RELEASE_TABLET | Freq: Two times a day (BID) | ORAL | 3 refills | Status: AC
  Filled 2023-03-05: qty 180, 90d supply, fill #0
  Filled 2023-08-16: qty 180, 90d supply, fill #1
  Filled 2023-11-18: qty 180, 90d supply, fill #2

## 2023-03-05 MED ORDER — FAMOTIDINE 20 MG PO TABS
20.0000 mg | ORAL_TABLET | Freq: Two times a day (BID) | ORAL | 3 refills | Status: AC
Start: 2023-03-05 — End: ?
  Filled 2023-03-05: qty 180, 90d supply, fill #0

## 2023-03-05 MED ORDER — ZOLPIDEM TARTRATE 10 MG PO TABS
10.0000 mg | ORAL_TABLET | Freq: Every evening | ORAL | 1 refills | Status: DC | PRN
  Filled 2023-03-05: qty 30, 30d supply, fill #0
  Filled 2023-05-08: qty 30, 30d supply, fill #1
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
## 2023-03-05 NOTE — Assessment & Plan Note (Signed)
 Reports over past weeks ongoing intermittent episodes of elevated HR.  Lasting minutes to hours.  Will obtain a Zio to further assess and if abnormal get into cardiology.
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
## 2023-03-06 ENCOUNTER — Other Ambulatory Visit: Payer: Self-pay

## 2023-03-06 DIAGNOSIS — Z09 Encounter for follow-up examination after completed treatment for conditions other than malignant neoplasm: Secondary | ICD-10-CM | POA: Diagnosis not present

## 2023-03-06 DIAGNOSIS — Z8719 Personal history of other diseases of the digestive system: Secondary | ICD-10-CM | POA: Diagnosis not present

## 2023-03-06 DIAGNOSIS — Z9049 Acquired absence of other specified parts of digestive tract: Secondary | ICD-10-CM | POA: Diagnosis not present

## 2023-03-06 DIAGNOSIS — Z9889 Other specified postprocedural states: Secondary | ICD-10-CM | POA: Diagnosis not present

## 2023-03-06 MED ORDER — PREGABALIN 100 MG PO CAPS
100.0000 mg | ORAL_CAPSULE | Freq: Three times a day (TID) | ORAL | 11 refills | Status: AC
  Filled 2023-03-06: qty 90, 30d supply, fill #0
  Filled 2023-04-05: qty 90, 30d supply, fill #1
  Filled 2023-05-08: qty 90, 30d supply, fill #2
  Filled 2023-06-13: qty 90, 30d supply, fill #3
  Filled 2023-07-18: qty 90, 30d supply, fill #4
  Filled 2023-09-18: qty 90, 30d supply, fill #5
  Filled 2024-01-20: qty 90, 30d supply, fill #6

## 2023-03-08 ENCOUNTER — Other Ambulatory Visit: Payer: Self-pay

## 2023-03-09 DIAGNOSIS — R000 Tachycardia, unspecified: Secondary | ICD-10-CM

## 2023-03-15 ENCOUNTER — Other Ambulatory Visit: Payer: Self-pay

## 2023-03-15 DIAGNOSIS — D2271 Melanocytic nevi of right lower limb, including hip: Secondary | ICD-10-CM | POA: Diagnosis not present

## 2023-03-15 DIAGNOSIS — D2272 Melanocytic nevi of left lower limb, including hip: Secondary | ICD-10-CM | POA: Diagnosis not present

## 2023-03-15 DIAGNOSIS — L821 Other seborrheic keratosis: Secondary | ICD-10-CM | POA: Diagnosis not present

## 2023-03-15 DIAGNOSIS — D2261 Melanocytic nevi of right upper limb, including shoulder: Secondary | ICD-10-CM | POA: Diagnosis not present

## 2023-03-15 DIAGNOSIS — D225 Melanocytic nevi of trunk: Secondary | ICD-10-CM | POA: Diagnosis not present

## 2023-03-15 DIAGNOSIS — D2262 Melanocytic nevi of left upper limb, including shoulder: Secondary | ICD-10-CM | POA: Diagnosis not present

## 2023-03-15 DIAGNOSIS — L309 Dermatitis, unspecified: Secondary | ICD-10-CM | POA: Diagnosis not present

## 2023-03-15 MED ORDER — CLOBETASOL PROPIONATE 0.05 % EX OINT
1.0000 | TOPICAL_OINTMENT | Freq: Two times a day (BID) | CUTANEOUS | 2 refills | Status: AC
Start: 2023-03-15 — End: ?
  Filled 2023-03-15: qty 45, 45d supply, fill #0

## 2023-03-30 DIAGNOSIS — R000 Tachycardia, unspecified: Secondary | ICD-10-CM | POA: Diagnosis not present

## 2023-04-01 ENCOUNTER — Ambulatory Visit
Admission: EM | Admit: 2023-04-01 | Discharge: 2023-04-01 | Disposition: A | Discharge status: Home / Self Care | Attending: Family Medicine | Admitting: Family Medicine

## 2023-04-01 ENCOUNTER — Other Ambulatory Visit: Payer: Self-pay

## 2023-04-01 DIAGNOSIS — S0500XA Injury of conjunctiva and corneal abrasion without foreign body, unspecified eye, initial encounter: Secondary | ICD-10-CM | POA: Diagnosis not present

## 2023-04-01 DIAGNOSIS — H1033 Unspecified acute conjunctivitis, bilateral: Secondary | ICD-10-CM | POA: Diagnosis not present

## 2023-04-01 MED ORDER — BACITRACIN-POLYMYXIN B 500-10000 UNIT/GM OP OINT
1.0000 | TOPICAL_OINTMENT | Freq: Four times a day (QID) | OPHTHALMIC | 0 refills | Status: DC
  Filled 2023-04-01: qty 3.5, 7d supply, fill #0

## 2023-04-01 MED ORDER — BACITRACIN-POLYMYXIN B 500-10000 UNIT/GM OP OINT: TOPICAL_OINTMENT | Freq: Four times a day (QID) | OPHTHALMIC | Status: DC
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
## 2023-04-01 NOTE — ED Triage Notes (Signed)
 Pt c/o bilateral eye irritation,burning & redness x1 day. Has tried OTC eye drops w/o relief.

## 2023-04-01 NOTE — Discharge Instructions (Addendum)
 Stop by the pharmacy to pick up your antibiotic eye medication.  Follow up with your primary eyecare provider or [facility] if symptoms suddenly worsen or you have little improvement in your eye symptoms.

## 2023-04-05 ENCOUNTER — Other Ambulatory Visit: Payer: Self-pay

## 2023-04-08 ENCOUNTER — Other Ambulatory Visit: Payer: Self-pay

## 2023-04-08 ENCOUNTER — Encounter: Payer: Self-pay | Admitting: Nurse Practitioner

## 2023-04-08 NOTE — Progress Notes (Signed)
 Contacted via MyChart   Good afternoon Darren Miller, your cardiac monitor results have returned.  You had one episode of increased heart rate, but this was nonsustained and lasted for 5 beats only.  There were also rare extra beats, these can be a common finding and are not harmful.  I recommend we monitor and if any symptoms worsen in future we can get you into cardiology.  For now overall monitoring is reassuring.:) Any questions? Keep being amazing!!  Thank you for allowing me to participate in your care.  I appreciate you. Kindest regards, Shanikqua Zarzycki

## 2023-04-15 ENCOUNTER — Ambulatory Visit: Payer: 59 | Admitting: Student in an Organized Health Care Education/Training Program

## 2023-04-24 ENCOUNTER — Ambulatory Visit: Payer: 59 | Admitting: Nurse Practitioner

## 2023-04-30 ENCOUNTER — Encounter: Payer: Self-pay | Admitting: Student in an Organized Health Care Education/Training Program

## 2023-04-30 ENCOUNTER — Other Ambulatory Visit: Payer: Self-pay

## 2023-04-30 ENCOUNTER — Ambulatory Visit
Payer: 59 | Attending: Student in an Organized Health Care Education/Training Program | Admitting: Student in an Organized Health Care Education/Training Program

## 2023-04-30 VITALS — BP 118/83 | HR 78 | Temp 98.2°F | Ht 70.0 in | Wt 230.0 lb

## 2023-04-30 DIAGNOSIS — G5792 Unspecified mononeuropathy of left lower limb: Secondary | ICD-10-CM | POA: Insufficient documentation

## 2023-04-30 DIAGNOSIS — G894 Chronic pain syndrome: Secondary | ICD-10-CM | POA: Insufficient documentation

## 2023-04-30 DIAGNOSIS — R1032 Left lower quadrant pain: Secondary | ICD-10-CM | POA: Insufficient documentation

## 2023-04-30 DIAGNOSIS — M792 Neuralgia and neuritis, unspecified: Secondary | ICD-10-CM | POA: Insufficient documentation

## 2023-04-30 DIAGNOSIS — G5782 Other specified mononeuropathies of left lower limb: Secondary | ICD-10-CM | POA: Diagnosis not present

## 2023-04-30 MED ORDER — OXYCODONE-ACETAMINOPHEN 10-325 MG PO TABS
1.0000 | ORAL_TABLET | Freq: Two times a day (BID) | ORAL | 0 refills | Status: AC | PRN
Start: 2023-05-08 — End: 2023-06-07
  Filled 2023-05-08: qty 60, 30d supply, fill #0

## 2023-04-30 MED ORDER — OXYCODONE-ACETAMINOPHEN 10-325 MG PO TABS
1.0000 | ORAL_TABLET | Freq: Two times a day (BID) | ORAL | 0 refills | Status: DC | PRN
  Filled 2023-07-18: qty 60, 30d supply, fill #0

## 2023-04-30 MED ORDER — OXYCODONE-ACETAMINOPHEN 10-325 MG PO TABS
1.0000 | ORAL_TABLET | Freq: Two times a day (BID) | ORAL | 0 refills | Status: AC | PRN
  Filled 2023-06-14: qty 51, 25d supply, fill #0
  Filled 2023-06-14: qty 9, 5d supply, fill #0

## 2023-04-30 NOTE — Progress Notes (Signed)
 PROVIDER NOTE: Interpretation of information contained herein should be left to medically-trained personnel. Specific patient instructions are provided elsewhere under "Patient Instructions" section of medical record. This document was created in part using AI and STT-dictation technology, any transcriptional errors that may result from this process are unintentional.  Patient: Darren Miller  Service: E/M   PCP: Marjie Skiff, NP  DOB: 28-Mar-1985  DOS: 04/30/2023  Provider: Edward Jolly, MD  MRN: [id number]  Delivery: Face-to-face  Specialty: Interventional Pain Management  Type: Established Patient  Setting: Ambulatory outpatient facility  Specialty designation: 09  Referring Prov.: Marjie Skiff, NP  Location: Outpatient office facility       HPI  [name], a 38 y.o. year old male, is here today because of his Abdominal pain, left lower quadrant [R10.32]. [name] primary complain today is Other (LEFT GROIN, Hip, lower left quadrant of stomach)   Pain Assessment: Severity of Chronic pain is reported as a 6 /10. Location: Hip Left (left groin and left side of stomach)/denies. Onset: More than a month ago. Quality: Sharp, Burning. Timing: Constant. Modifying factor(s): laying down in recliner, meds. Vitals:  height is 5\' 10"  (1.778 m) and weight is 230 lb (104.3 kg). His temperature is 98.2 F (36.8 C). His blood pressure is 118/83 and his pulse is 78. His oxygen saturation is 78% (abnormal).  BMI: Estimated body mass index is 33 kg/m as calculated from the following:   Height as of this encounter: 5\' 10"  (1.778 m).   Weight as of this encounter: 230 lb (104.3 kg). Last encounter: 02/26/2023.  Reason for encounter: both, medication management and post-procedure evaluation and assessment.  The patient indicates doing well with the current medication regimen.  No adverse reaction or side effects reported to the medication.  Routine UDS ordered today.  The patient experienced  approximately 30 to 50% pain relief from ilioinguinal nerve block from 02/25/2023.  The patient expressed expressed interest in receiving another ilioinguinal nerve block that is scheduled on May 2025.    Post-procedure evaluation     Procedure:          Anesthesia, Analgesia, Anxiolysis:  Type:  Ilioinguinal  Nerve Block #2  Primary Purpose: Diagnostic Region: ASIS, Groin Region Target Area: Lateral one third of the line between the left ASIS and left umbilicus Approach: Anterior approach Laterality: Left  Anesthesia: Local (1-2% Lidocaine)  Anxiolysis: Oral Valium 10 mg Guidance: Ultrasound           Position: Supine   1. Ilioinguinal neuralgia of left side   2. Genitofemoral neuralgia of left side   3. Chronic pain syndrome    NAS-11 Pain score:   Pre-procedure: 6 /10   Post-procedure: 5 /10    Effectiveness:  Initial hour after procedure: 100 %  Subsequent 4-6 hours post-procedure: 100 %  Analgesia past initial 6 hours: 30 % - 50% Ongoing improvement:  Analgesic:  30-50%    Pharmacotherapy Assessment  Analgesic: Oxycodone-acetaminophen (Percocet) 10-3 25 mg every 12 hours as needed for pain. MME=30 Monitoring:  PMP: PDMP reviewed during this encounter.       Pharmacotherapy: No side-effects or adverse reactions reported. Compliance: No problems identified. Effectiveness: Clinically acceptable.  Florina Ou, RN  04/30/2023 10:01 AM  Sign when Signing Visit Nursing Pain Medication Assessment:  Safety precautions to be maintained throughout the outpatient stay will include: orient to surroundings, keep bed in low position, maintain call bell within reach at all times, provide assistance with  transfer out of bed and ambulation.  Medication Inspection Compliance: Darren Miller did not comply with our request to bring his pills to be counted. He was reminded that bringing the medication bottles, even when empty, is a requirement.  Medication:  Oxycodone/APAP Pill/Patch Count: No pills available to be counted. Pill/Patch Appearance: No markings Bottle Appearance: No container available. Did not bring bottle(s) to appointment. Filled Date: no bottle / no bottle / 2025 Last Medication intake:  TodaySafety precautions to be maintained throughout the outpatient stay will include: orient to surroundings, keep bed in low position, maintain call bell within reach at all times, provide assistance with transfer out of bed and ambulation.     No results found for: "CBDTHCR" No results found for: "D8THCCBX" No results found for: "D9THCCBX"  UDS:  No results found for: "SUMMARY"    ROS  Constitutional: Denies any fever or chills Gastrointestinal: No reported hemesis, hematochezia, vomiting, or acute GI distress Musculoskeletal:  left lower quadrant pain Neurological: No reported episodes of acute onset apraxia, aphasia, dysarthria, agnosia, amnesia, paralysis, loss of coordination, or loss of consciousness  Medication Review  Crisaborole, Medi-First Triple Antibiotic, SUMAtriptan, acetaminophen, albuterol, amphetamine-dextroamphetamine, bacitracin, bacitracin-polymyxin b, baclofen, bisacodyl, buPROPion, celecoxib, clobetasol ointment, diphenoxylate-atropine, famotidine, methocarbamol, oxyCODONE-acetaminophen, pantoprazole, prazosin, pregabalin, rOPINIRole, simethicone, tadalafil, traZODone, triamcinolone cream, and zolpidem  History Review  Allergy: Darren Miller is allergic to morphine, silicone, and tape. Drug: Darren Miller  reports current drug use. Drug: Marijuana. Alcohol:  reports that he does not currently use alcohol after a past usage of about 14.0 standard drinks of alcohol per week. Tobacco:  reports that he has quit smoking. His smoking use included cigarettes. He has never used smokeless tobacco. Social: Darren Miller  reports that he has quit smoking. His smoking use included cigarettes. He has never used smokeless tobacco. He  reports that he does not currently use alcohol after a past usage of about 14.0 standard drinks of alcohol per week. He reports current drug use. Drug: Marijuana. Medical:  has a past medical history of ADHD (attention deficit hyperactivity disorder), Allergy to alpha-gal, Anxiety, Depression, and GERD (gastroesophageal reflux disease). Surgical: Mr. Raulston  has a past surgical history that includes Appendectomy; Colonoscopy with propofol (N/A, 11/19/2017); Esophagogastroduodenoscopy (egd) with propofol (N/A, 11/19/2017); and Knee arthroscopy with meniscal repair (Right, 07/16/2019). Family: family history includes ADD / ADHD in his paternal aunt and paternal uncle; Alcohol abuse in his father, paternal grandfather, paternal grandmother, and paternal uncle; Thyroid disease in his mother.  Laboratory Chemistry Profile   Renal Lab Results  Component Value Date   BUN 23 (H) 01/29/2023   CREATININE 1.00 01/29/2023   LABCREA 11.1 (L) 10/30/2022   BCR 23 (H) 01/29/2023   GFRAA >60 07/20/2019   GFRNONAA >60 02/04/2021    Hepatic Lab Results  Component Value Date   AST 26 01/29/2023   ALT 35 01/29/2023   ALBUMIN 4.3 01/29/2023   ALKPHOS 182 (H) 01/29/2023    Electrolytes Lab Results  Component Value Date   NA 132 (L) 01/29/2023   K 4.8 01/29/2023   CL 97 01/29/2023   CALCIUM 9.5 01/29/2023   MG 2.1 02/04/2021    Bone No results found for: "VD25OH", "VD125OH2TOT", "JW1191YN8", "GN5621HY8", "25OHVITD1", "25OHVITD2", "25OHVITD3", "TESTOFREE", "TESTOSTERONE"  Inflammation (CRP: Acute Phase) (ESR: Chronic Phase) Lab Results  Component Value Date   LATICACIDVEN 1.3 02/02/2021         Note: Above Lab results reviewed.  Recent Imaging Review  LONG TERM MONITOR (3-14 DAYS) HR  47 - 185, average 87. 1 nonsustained SVT lasting 5 beats. Rare supraventricular and ventricular ectopy. No atrial fibrillation. No sustained arrhythmias.  Sheria Lang T. Lalla Brothers, MD, [facility], [facility]  Electrophysiology Note: Reviewed        Physical Exam  General appearance: Well nourished, well developed, and well hydrated. In no apparent acute distress Mental status: Alert, oriented x 3 (person, place, & time)       Respiratory: No evidence of acute respiratory distress Eyes: PERLA Vitals: BP 118/83   Pulse 78   Temp 98.2 F (36.8 C)   Ht 5\' 10"  (1.778 m)   Wt 230 lb (104.3 kg)   SpO2 (!) 78%   BMI 33.00 kg/m  BMI: Estimated body mass index is 33 kg/m as calculated from the following:   Height as of this encounter: 5\' 10"  (1.778 m).   Weight as of this encounter: 230 lb (104.3 kg). Ideal: Ideal body weight: 73 kg (160 lb 15 oz) Adjusted ideal body weight: 85.5 kg (188 lb 9 oz)  Assessment   Diagnosis Status  1. Abdominal pain, left lower quadrant   2. Ilioinguinal neuralgia of left side   3. Genitofemoral neuralgia of left side   4. Chronic pain syndrome   5. Neuropathic pain    Controlled Controlled Controlled   Plan of Care  Assessment and Plan We will continue on current medication regimen.  The patient is required to bring in the pain medication for pill count at the next visit. Failure to do so will result in no further pain medication being prescribed. The patient expressed expressed interest in receiving another ilioinguinal nerve block that will be scheduled in May 2025.   Pharmacotherapy (Medications Ordered): Meds ordered this encounter  Medications   oxyCODONE-acetaminophen (PERCOCET) 10-325 MG tablet    Sig: Take 1 tablet by mouth every 12 (twelve) hours as needed for pain. Must last 30 days.    Dispense:  60 tablet    Refill:  0    Chronic Pain: STOP Act (Not applicable) Fill 1 day early if closed on refill date. Avoid benzodiazepines within 8 hours of opioids   oxyCODONE-acetaminophen (PERCOCET) 10-325 MG tablet    Sig: Take 1 tablet by mouth every 12 (twelve) hours as needed for pain. Must last 30 days.    Dispense:  60 tablet    Refill:  0     Chronic Pain: STOP Act (Not applicable) Fill 1 day early if closed on refill date. Avoid benzodiazepines within 8 hours of opioids   oxyCODONE-acetaminophen (PERCOCET) 10-325 MG tablet    Sig: Take 1 tablet by mouth every 12 (twelve) hours as needed for pain. Must last 30 days.    Dispense:  60 tablet    Refill:  0    Chronic Pain: STOP Act (Not applicable) Fill 1 day early if closed on refill date. Avoid benzodiazepines within 8 hours of opioids   Orders:  Orders Placed This Encounter  Procedures   ILIONINGUINAL NERVE BLOCK    Standing Status:   Future    Expected Date:   06/05/2023    Expiration Date:   07/30/2023    Scheduling Instructions:     Side:LEFT     Sedation: With PO Valium    Where will this procedure be performed?:   [facility]   ToxASSURE Select 13 (MW), Urine    Volume: 30 ml(s). Minimum 3 ml of urine is needed. Document temperature of fresh sample. Indications: Long term (current) use of  opiate analgesic (W09.811)    Release to patient:   Immediate   Follow-up plan:   Return in about 3 months (around 07/30/2023) for (F2F), (MM), Marthe Slain NP.    Recent Visits Date Type Provider Dept  02/25/23 Procedure visit Cephus Collin, MD [facility]  02/07/23 Office Visit Cephus Collin, MD [facility]  Showing recent visits within past 90 days and meeting all other requirements Today's Visits Date Type Provider Dept  04/30/23 Office Visit Cephus Collin, MD [facility]  Showing today's visits and meeting all other requirements Future Appointments Date Type Provider Dept  06/03/23 Appointment Cephus Collin, MD [facility]  07/23/23 Appointment Javious Hallisey K, NP [facility]  Showing future appointments within next 90 days and meeting all other requirements  I discussed the assessment and treatment plan with the patient. The patient was provided an opportunity to ask questions and all were answered. The patient  agreed with the plan and demonstrated an understanding of the instructions.  Patient advised to call back or seek an in-person evaluation if the symptoms or condition worsens.  Duration of encounter: 30 minutes.  Total time on encounter, as per AMA guidelines included both the face-to-face and non-face-to-face time personally spent by the physician and/or other qualified health care professional(s) on the day of the encounter (includes time in activities that require the physician or other qualified health care professional and does not include time in activities normally performed by clinical staff). Physician's time may include the following activities when performed: Preparing to see the patient (e.g., pre-charting review of records, searching for previously ordered imaging, lab work, and nerve conduction tests) Review of prior analgesic pharmacotherapies. Reviewing PMP Interpreting ordered tests (e.g., lab work, imaging, nerve conduction tests) Performing post-procedure evaluations, including interpretation of diagnostic procedures Obtaining and/or reviewing separately obtained history Performing a medically appropriate examination and/or evaluation Counseling and educating the patient/family/caregiver Ordering medications, tests, or procedures Referring and communicating with other health care professionals (when not separately reported) Documenting clinical information in the electronic or other health record Independently interpreting results (not separately reported) and communicating results to the patient/ family/caregiver Care coordination (not separately reported)  Note by: Cephus Collin, MD (TTS and AI technology used. I apologize for any typographical errors that were not detected and corrected.) Date: 04/30/2023; Time: 11:57 AM

## 2023-04-30 NOTE — Progress Notes (Signed)
 Nursing Pain Medication Assessment:  Safety precautions to be maintained throughout the outpatient stay will include: orient to surroundings, keep bed in low position, maintain call bell within reach at all times, provide assistance with transfer out of bed and ambulation.  Medication Inspection Compliance: Darren Miller did not comply with our request to bring his pills to be counted. He was reminded that bringing the medication bottles, even when empty, is a requirement.  Medication: Oxycodone/APAP Pill/Patch Count: No pills available to be counted. Pill/Patch Appearance: No markings Bottle Appearance: No container available. Did not bring bottle(s) to appointment. Filled Date: no bottle / no bottle / 2025 Last Medication intake:  TodaySafety precautions to be maintained throughout the outpatient stay will include: orient to surroundings, keep bed in low position, maintain call bell within reach at all times, provide assistance with transfer out of bed and ambulation.

## 2023-05-03 LAB — TOXASSURE SELECT 13 (MW), URINE

## 2023-05-08 ENCOUNTER — Other Ambulatory Visit: Payer: Self-pay

## 2023-05-08 ENCOUNTER — Other Ambulatory Visit: Payer: Self-pay | Admitting: Nurse Practitioner

## 2023-05-09 ENCOUNTER — Other Ambulatory Visit: Payer: Self-pay

## 2023-05-09 MED ORDER — AMPHETAMINE-DEXTROAMPHETAMINE 20 MG PO TABS
20.0000 mg | ORAL_TABLET | Freq: Two times a day (BID) | ORAL | 0 refills | Status: DC
  Filled 2023-05-09: qty 60, 30d supply, fill #0

## 2023-05-09 NOTE — Telephone Encounter (Signed)
 Requested medication (s) are due for refill today: yes  Requested medication (s) are on the active medication list: yes  Last refill:  01/29/23  Future visit scheduled: no  Notes to clinic:  Unable to refill per protocol, cannot delegate.      Requested Prescriptions  Pending Prescriptions Disp Refills   amphetamine -dextroamphetamine  (ADDERALL) 20 MG tablet 60 tablet 0    Sig: Take 1 tablet (20 mg total) by mouth 2 (two) times daily.     Not Delegated - Psychiatry:  Stimulants/ADHD Failed - 05/09/2023  9:18 AM      Failed - This refill cannot be delegated      Passed - Urine Drug Screen completed in last 360 days      Passed - Last BP in normal range    BP Readings from Last 1 Encounters:  04/30/23 118/83         Passed - Last Heart Rate in normal range    Pulse Readings from Last 1 Encounters:  04/30/23 78         Passed - Valid encounter within last 6 months    Recent Outpatient Visits           2 months ago MDD (major depressive disorder), recurrent episode, mild (HCC)   [facility] [place], Lavelle Posey, NP

## 2023-05-13 ENCOUNTER — Other Ambulatory Visit (HOSPITAL_COMMUNITY): Payer: Self-pay

## 2023-05-15 ENCOUNTER — Ambulatory Visit (INDEPENDENT_AMBULATORY_CARE_PROVIDER_SITE_OTHER)

## 2023-05-15 ENCOUNTER — Ambulatory Visit
Admission: EM | Admit: 2023-05-15 | Discharge: 2023-05-15 | Disposition: A | Discharge status: Home / Self Care | Attending: Physician Assistant | Admitting: Physician Assistant

## 2023-05-15 DIAGNOSIS — R059 Cough, unspecified: Secondary | ICD-10-CM | POA: Diagnosis not present

## 2023-05-15 DIAGNOSIS — W57XXXA Bitten or stung by nonvenomous insect and other nonvenomous arthropods, initial encounter: Secondary | ICD-10-CM | POA: Insufficient documentation

## 2023-05-15 DIAGNOSIS — R61 Generalized hyperhidrosis: Secondary | ICD-10-CM | POA: Insufficient documentation

## 2023-05-15 DIAGNOSIS — M791 Myalgia, unspecified site: Secondary | ICD-10-CM | POA: Diagnosis not present

## 2023-05-15 DIAGNOSIS — R052 Subacute cough: Secondary | ICD-10-CM

## 2023-05-15 DIAGNOSIS — R509 Fever, unspecified: Secondary | ICD-10-CM | POA: Diagnosis not present

## 2023-05-15 DIAGNOSIS — R000 Tachycardia, unspecified: Secondary | ICD-10-CM | POA: Diagnosis not present

## 2023-05-15 DIAGNOSIS — R519 Headache, unspecified: Secondary | ICD-10-CM | POA: Insufficient documentation

## 2023-05-15 DIAGNOSIS — R0602 Shortness of breath: Secondary | ICD-10-CM | POA: Diagnosis not present

## 2023-05-15 DIAGNOSIS — J209 Acute bronchitis, unspecified: Secondary | ICD-10-CM | POA: Diagnosis not present

## 2023-05-15 MED ORDER — DOXYCYCLINE HYCLATE 100 MG PO CAPS: 100.0000 mg | ORAL_CAPSULE | Freq: Two times a day (BID) | ORAL | 0 refills | Status: AC

## 2023-05-15 MED ORDER — PREDNISONE 20 MG PO TABS: 40.0000 mg | ORAL_TABLET | Freq: Every day | ORAL | 0 refills | Status: AC

## 2023-05-15 MED ORDER — PROMETHAZINE-DM 6.25-15 MG/5ML PO SYRP: 5.0000 mL | ORAL_SOLUTION | Freq: Four times a day (QID) | ORAL | 0 refills | Status: DC | PRN

## 2023-05-15 NOTE — ED Triage Notes (Signed)
 Pt c/o cough & sob x3 wks & fever/bodyaches x3 days. Has tried Excedrin w/o relief. Denies any hx of asthma or COPD.

## 2023-05-15 NOTE — Discharge Instructions (Addendum)
-   Chest x-ray negative for pneumonia. - We are testing for tickborne illness resulting back in a couple of days. -Sent prescription for doxycycline  for a week.  If you are positive for tickborne illness we will extend the course. - Increase rest and fluids.  Your symptoms are consistent with bronchitis and cough can last for several weeks.  I will send in prednisone  and cough medicine. -If high-grade fever, increased weakness, worsening breathing problem, increased chest pain please return to the ER for further evaluation.
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
## 2023-05-16 LAB — LYME DISEASE SEROLOGY W/REFLEX: Lyme Total Antibody EIA: NEGATIVE

## 2023-06-03 ENCOUNTER — Ambulatory Visit: Admitting: Student in an Organized Health Care Education/Training Program

## 2023-06-13 ENCOUNTER — Other Ambulatory Visit: Payer: Self-pay

## 2023-06-13 ENCOUNTER — Other Ambulatory Visit: Payer: Self-pay | Admitting: Nurse Practitioner

## 2023-06-14 ENCOUNTER — Other Ambulatory Visit: Payer: Self-pay

## 2023-06-14 ENCOUNTER — Other Ambulatory Visit: Payer: Self-pay | Admitting: Nurse Practitioner

## 2023-06-14 NOTE — Telephone Encounter (Signed)
 Requested medications are due for refill today.  yes  Requested medications are on the active medications list.  yes  Last refill. Ambien  03/05/2023 #30 1 rf,  Adderall 05/09/2023 #60 0 rf  Future visit scheduled.   no  Notes to clinic.  Refills not delegated.    Requested Prescriptions  Pending Prescriptions Disp Refills   zolpidem  (AMBIEN ) 10 MG tablet 30 tablet 1    Sig: Take 1 tablet (10 mg total) by mouth at bedtime as needed for sleep.     Not Delegated - Psychiatry:  Anxiolytics/Hypnotics Failed - 06/14/2023  5:36 PM      Failed - This refill cannot be delegated      Passed - Urine Drug Screen completed in last 360 days      Passed - Valid encounter within last 6 months    Recent Outpatient Visits           3 months ago MDD (major depressive disorder), recurrent episode, mild (HCC)   [place] [facility] [place], Jolene T, NP               amphetamine -dextroamphetamine  (ADDERALL) 20 MG tablet 60 tablet 0    Sig: Take 1 tablet (20 mg total) by mouth 2 (two) times daily.     Not Delegated - Psychiatry:  Stimulants/ADHD Failed - 06/14/2023  5:36 PM      Failed - This refill cannot be delegated      Failed - Last Heart Rate in normal range    Pulse Readings from Last 1 Encounters:  05/15/23 (!) 115         Passed - Urine Drug Screen completed in last 360 days      Passed - Last BP in normal range    BP Readings from Last 1 Encounters:  05/15/23 117/82         Passed - Valid encounter within last 6 months    Recent Outpatient Visits           3 months ago MDD (major depressive disorder), recurrent episode, mild (HCC)   [place] [facility] [place], Lavelle Posey, NP

## 2023-06-17 ENCOUNTER — Other Ambulatory Visit: Payer: Self-pay

## 2023-06-17 DIAGNOSIS — L400 Psoriasis vulgaris: Secondary | ICD-10-CM | POA: Diagnosis not present

## 2023-06-17 MED ORDER — CLOBETASOL PROPIONATE 0.05 % EX OINT
TOPICAL_OINTMENT | CUTANEOUS | 2 refills | Status: AC
  Filled 2023-06-17: qty 45, 30d supply, fill #0

## 2023-06-17 MED FILL — Zolpidem Tartrate Tab 10 MG: ORAL | 30 days supply | Qty: 30 | Fill #0 | Status: AC

## 2023-06-17 MED FILL — Amphetamine-Dextroamphetamine Tab 20 MG: ORAL | 30 days supply | Qty: 60 | Fill #0 | Status: AC

## 2023-06-17 NOTE — Telephone Encounter (Signed)
 Patient is overdue for an appointment. Please call and schedule appointment and then route to provider for refill.

## 2023-06-17 NOTE — Telephone Encounter (Signed)
 Requested medications are due for refill today.  yes  Requested medications are on the active medications list.  yes  Last refill. Adderall 05/09/2023 #60 0 rf, Zolpidem  03/05/2023 #30 1 rf  Future visit scheduled.   no  Notes to clinic.  Refills not delegated.    Requested Prescriptions  Pending Prescriptions Disp Refills   amphetamine -dextroamphetamine  (ADDERALL) 20 MG tablet 60 tablet 0    Sig: Take 1 tablet (20 mg total) by mouth 2 (two) times daily.     Not Delegated - Psychiatry:  Stimulants/ADHD Failed - 06/17/2023 11:43 AM      Failed - This refill cannot be delegated      Failed - Last Heart Rate in normal range    Pulse Readings from Last 1 Encounters:  05/15/23 (!) 115         Passed - Urine Drug Screen completed in last 360 days      Passed - Last BP in normal range    BP Readings from Last 1 Encounters:  05/15/23 117/82         Passed - Valid encounter within last 6 months    Recent Outpatient Visits           3 months ago MDD (major depressive disorder), recurrent episode, mild (HCC)   [facility] [place], Jolene T, NP               zolpidem  (AMBIEN ) 10 MG tablet 30 tablet 1    Sig: Take 1 tablet (10 mg total) by mouth at bedtime as needed for sleep.     Not Delegated - Psychiatry:  Anxiolytics/Hypnotics Failed - 06/17/2023 11:43 AM      Failed - This refill cannot be delegated      Passed - Urine Drug Screen completed in last 360 days      Passed - Valid encounter within last 6 months    Recent Outpatient Visits           3 months ago MDD (major depressive disorder), recurrent episode, mild (HCC)   [facility] [place], Lavelle Posey, NP

## 2023-06-17 NOTE — Telephone Encounter (Signed)
 Appt scheduled 07-01-23

## 2023-06-29 NOTE — Patient Instructions (Signed)

## 2023-07-01 ENCOUNTER — Ambulatory Visit
Attending: Student in an Organized Health Care Education/Training Program | Admitting: Student in an Organized Health Care Education/Training Program

## 2023-07-01 ENCOUNTER — Other Ambulatory Visit: Payer: Self-pay

## 2023-07-01 ENCOUNTER — Encounter: Payer: Self-pay | Admitting: Student in an Organized Health Care Education/Training Program

## 2023-07-01 ENCOUNTER — Encounter: Payer: Self-pay | Admitting: Nurse Practitioner

## 2023-07-01 ENCOUNTER — Ambulatory Visit: Admitting: Nurse Practitioner

## 2023-07-01 VITALS — BP 109/66 | HR 60 | Temp 98.2°F | Resp 15 | Ht 70.0 in | Wt 228.8 lb

## 2023-07-01 VITALS — BP 107/73 | HR 70 | Temp 98.1°F | Resp 11 | Ht 70.0 in | Wt 227.0 lb

## 2023-07-01 DIAGNOSIS — E66811 Obesity, class 1: Secondary | ICD-10-CM

## 2023-07-01 DIAGNOSIS — G5792 Unspecified mononeuropathy of left lower limb: Secondary | ICD-10-CM | POA: Diagnosis not present

## 2023-07-01 DIAGNOSIS — F988 Other specified behavioral and emotional disorders with onset usually occurring in childhood and adolescence: Secondary | ICD-10-CM

## 2023-07-01 DIAGNOSIS — F330 Major depressive disorder, recurrent, mild: Secondary | ICD-10-CM | POA: Diagnosis not present

## 2023-07-01 DIAGNOSIS — G4733 Obstructive sleep apnea (adult) (pediatric): Secondary | ICD-10-CM

## 2023-07-01 DIAGNOSIS — F5104 Psychophysiologic insomnia: Secondary | ICD-10-CM | POA: Diagnosis not present

## 2023-07-01 DIAGNOSIS — G894 Chronic pain syndrome: Secondary | ICD-10-CM | POA: Insufficient documentation

## 2023-07-01 DIAGNOSIS — F4310 Post-traumatic stress disorder, unspecified: Secondary | ICD-10-CM

## 2023-07-01 DIAGNOSIS — R1032 Left lower quadrant pain: Secondary | ICD-10-CM | POA: Diagnosis not present

## 2023-07-01 DIAGNOSIS — E6609 Other obesity due to excess calories: Secondary | ICD-10-CM | POA: Diagnosis not present

## 2023-07-01 DIAGNOSIS — G5782 Other specified mononeuropathies of left lower limb: Secondary | ICD-10-CM | POA: Diagnosis present

## 2023-07-01 DIAGNOSIS — R5383 Other fatigue: Secondary | ICD-10-CM

## 2023-07-01 DIAGNOSIS — Z6832 Body mass index (BMI) 32.0-32.9, adult: Secondary | ICD-10-CM

## 2023-07-01 DIAGNOSIS — E669 Obesity, unspecified: Secondary | ICD-10-CM | POA: Insufficient documentation

## 2023-07-01 MED ORDER — DEXAMETHASONE SODIUM PHOSPHATE 10 MG/ML IJ SOLN
INTRAMUSCULAR | Status: AC
Start: 2023-07-01 — End: 2023-07-01
  Filled 2023-07-01: qty 1

## 2023-07-01 MED ORDER — ZOLPIDEM TARTRATE 10 MG PO TABS
10.0000 mg | ORAL_TABLET | Freq: Every evening | ORAL | 3 refills | Status: DC | PRN
Start: 2023-07-01 — End: 2023-07-01
  Filled 2023-07-01: qty 30, 30d supply, fill #0

## 2023-07-01 MED ORDER — LIDOCAINE HCL 2 % IJ SOLN
INTRAMUSCULAR | Status: AC
  Filled 2023-07-01: qty 20

## 2023-07-01 MED ORDER — ROPIVACAINE HCL 2 MG/ML IJ SOLN
18.0000 mL | Freq: Once | INTRAMUSCULAR | Status: AC
  Administered 2023-07-01: 18 mL via PERINEURAL

## 2023-07-01 MED ORDER — AMPHETAMINE-DEXTROAMPHETAMINE 20 MG PO TABS
20.0000 mg | ORAL_TABLET | Freq: Two times a day (BID) | ORAL | 0 refills | Status: DC
Start: 2023-07-17 — End: 2023-10-03
  Filled 2023-07-18: qty 60, 30d supply, fill #0

## 2023-07-01 MED ORDER — LIDOCAINE HCL 2 % IJ SOLN
20.0000 mL | Freq: Once | INTRAMUSCULAR | Status: AC
  Administered 2023-07-01: 400 mg

## 2023-07-01 MED ORDER — AMPHETAMINE-DEXTROAMPHETAMINE 20 MG PO TABS
20.0000 mg | ORAL_TABLET | Freq: Two times a day (BID) | ORAL | 0 refills | Status: DC
Start: 2023-08-16 — End: 2023-10-03
  Filled 2023-08-16: qty 60, 30d supply, fill #0

## 2023-07-01 MED ORDER — DIAZEPAM 5 MG PO TABS
ORAL_TABLET | ORAL | Status: AC
Start: 2023-07-01 — End: 2023-07-01
  Filled 2023-07-01: qty 2

## 2023-07-01 MED ORDER — ROPIVACAINE HCL 2 MG/ML IJ SOLN
INTRAMUSCULAR | Status: AC
  Filled 2023-07-01: qty 20

## 2023-07-01 MED ORDER — DEXAMETHASONE SODIUM PHOSPHATE 10 MG/ML IJ SOLN
10.0000 mg | Freq: Once | INTRAMUSCULAR | Status: AC
  Administered 2023-07-01: 10 mg

## 2023-07-01 MED ORDER — DIAZEPAM 5 MG PO TABS
10.0000 mg | ORAL_TABLET | ORAL | Status: AC
  Administered 2023-07-01: 10 mg via ORAL

## 2023-07-01 MED ORDER — ZOLPIDEM TARTRATE 10 MG PO TABS
10.0000 mg | ORAL_TABLET | Freq: Every evening | ORAL | 3 refills | Status: DC | PRN
  Filled 2023-07-01 – 2023-07-18 (×2): qty 30, 30d supply, fill #0
  Filled 2023-08-16: qty 30, 30d supply, fill #1
  Filled 2023-09-18: qty 30, 30d supply, fill #2

## 2023-07-01 MED ORDER — AMPHETAMINE-DEXTROAMPHETAMINE 20 MG PO TABS
20.0000 mg | ORAL_TABLET | Freq: Two times a day (BID) | ORAL | 0 refills | Status: DC
Start: 2023-09-16 — End: 2023-10-03
  Filled 2023-09-18: qty 60, 30d supply, fill #0

## 2023-07-01 NOTE — Assessment & Plan Note (Signed)
 Tested in 2019 and CPAP recommended, but cost was too high.  Could consider retest in future and looking into CPAP if affordable, as suspect this would benefit overall health.
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
## 2023-07-01 NOTE — Progress Notes (Signed)
 Safety precautions to be maintained throughout the outpatient stay will include: orient to surroundings, keep bed in low position, maintain call bell within reach at all times, provide assistance with transfer out of bed and ambulation.
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
## 2023-07-01 NOTE — Patient Instructions (Signed)
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
## 2023-07-01 NOTE — Progress Notes (Signed)
 PROVIDER NOTE: Interpretation of information contained herein should be left to medically-trained personnel. Specific patient instructions are provided elsewhere under Patient Instructions section of medical record. This document was created in part using STT-dictation technology, any transcriptional errors that may result from this process are unintentional.  Patient: Darren Miller Type: Established DOB: 11/02/1985 MRN: [id number] PCP: Lemar Pyles, NP  Service: Procedure DOS: 07/01/2023 Setting: Ambulatory Location: Ambulatory outpatient facility Delivery: Face-to-face Provider: Cephus Collin, MD Specialty: Interventional Pain Management Specialty designation: 09 Location: Outpatient facility Ref. Prov.: Cannady, Jolene T, NP       Interventional Therapy   Primary Reason for Visit: Interventional Pain Management Treatment. CC: Abdominal Pain (Left side )    Procedure:          Anesthesia, Analgesia, Anxiolysis:  Type: Ilioinguinal Nerve Block #3  Primary Purpose: Diagnostic Region: ASIS, Groin Region Target Area: Lateral one third of the line between the left ASIS and left umbilicus Approach: Anterior approach Laterality: Left  Anesthesia: Local (1-2% Lidocaine )  Anxiolysis: Oral Valium  10 mg Guidance: Ultrasound           Position: Supine   1. Ilioinguinal neuralgia of left side   2. Genitofemoral neuralgia of left side   3. Abdominal pain, left lower quadrant   4. Chronic pain syndrome    NAS-11 Pain score:   Pre-procedure: 6 /10   Post-procedure: 0-No pain/10     H&P (Pre-op Assessment):  Mr. Villacis is a 38 y.o. (year old), male patient, seen today for interventional treatment. He  has a past surgical history that includes Appendectomy; Colonoscopy with propofol  (N/A, 11/19/2017); Esophagogastroduodenoscopy (egd) with propofol  (N/A, 11/19/2017); and Knee arthroscopy with meniscal repair (Right, 07/16/2019). Mr. Mitton has a current medication list which includes  the following prescription(s): acetaminophen , albuterol , amphetamine -dextroamphetamine , amphetamine -dextroamphetamine , amphetamine -dextroamphetamine , bacitracin , bacitracin -polymyxin b , baclofen , bisacodyl , bupropion , celecoxib , clobetasol  ointment, clobetasol  ointment, eucrisa , diphenoxylate -atropine , famotidine , methocarbamol , medi-first triple antibiotic, oxycodone -acetaminophen , [START ON 07/07/2023] oxycodone -acetaminophen , pantoprazole , prazosin , pregabalin , promethazine -dextromethorphan, ropinirole , simethicone , sumatriptan , tadalafil , trazodone , triamcinolone  cream, and zolpidem . His primarily concern today is the Abdominal Pain (Left side )  Initial Vital Signs:  Pulse/HCG Rate: 70ECG Heart Rate: 73 Temp: 98.1 F (36.7 C) Resp: 16 BP: 123/86 SpO2: 98 %  BMI: Estimated body mass index is 32.57 kg/m as calculated from the following:   Height as of this encounter: 5' 10 (1.778 m).   Weight as of this encounter: 227 lb (103 kg).  Risk Assessment: Allergies: Reviewed. He is allergic to morphine , silicone, and tape.  Allergy Precautions: None required Coagulopathies: Reviewed. None identified.  Blood-thinner therapy: None at this time Active Infection(s): Reviewed. None identified. Mr. Mayotte is afebrile  Site Confirmation: Mr. Canner was asked to confirm the procedure and laterality before marking the site Procedure checklist: Completed Consent: Before the procedure and under the influence of no sedative(s), amnesic(s), or anxiolytics, the patient was informed of the treatment options, risks and possible complications. To fulfill our ethical and legal obligations, as recommended by the American Medical Association's Code of Ethics, I have informed the patient of my clinical impression; the nature and purpose of the treatment or procedure; the risks, benefits, and possible complications of the intervention; the alternatives, including doing nothing; the risk(s) and benefit(s) of the  alternative treatment(s) or procedure(s); and the risk(s) and benefit(s) of doing nothing. The patient was provided information about the general risks and possible complications associated with the procedure. These may include, but are not limited to: failure to achieve desired goals, infection, bleeding, organ  or nerve damage, allergic reactions, paralysis, and death. In addition, the patient was informed of those risks and complications associated to the procedure, such as failure to decrease pain; infection; bleeding; organ or nerve damage with subsequent damage to sensory, motor, and/or autonomic systems, resulting in permanent pain, numbness, and/or weakness of one or several areas of the body; allergic reactions; (i.e.: anaphylactic reaction); and/or death. Furthermore, the patient was informed of those risks and complications associated with the medications. These include, but are not limited to: allergic reactions (i.e.: anaphylactic or anaphylactoid reaction(s)); adrenal axis suppression; blood sugar elevation that in diabetics may result in ketoacidosis or comma; water retention that in patients with history of congestive heart failure may result in shortness of breath, pulmonary edema, and decompensation with resultant heart failure; weight gain; swelling or edema; medication-induced neural toxicity; particulate matter embolism and blood vessel occlusion with resultant organ, and/or nervous system infarction; and/or aseptic necrosis of one or more joints. Finally, the patient was informed that Medicine is not an exact science; therefore, there is also the possibility of unforeseen or unpredictable risks and/or possible complications that may result in a catastrophic outcome. The patient indicated having understood very clearly. We have given the patient no guarantees and we have made no promises. Enough time was given to the patient to ask questions, all of which were answered to the patient's  satisfaction. Mr. Gum has indicated that he wanted to continue with the procedure. Attestation: I, the ordering provider, attest that I have discussed with the patient the benefits, risks, side-effects, alternatives, likelihood of achieving goals, and potential problems during recovery for the procedure that I have provided informed consent. Date  Time: 07/01/2023  8:12 AM  Pre-Procedure Preparation:  Monitoring: As per clinic protocol. Respiration, ETCO2, SpO2, BP, heart rate and rhythm monitor placed and checked for adequate function Safety Precautions: Patient was assessed for positional comfort and pressure points before starting the procedure. Time-out: I initiated and conducted the Time-out before starting the procedure, as per protocol. The patient was asked to participate by confirming the accuracy of the Time Out information. Verification of the correct person, site, and procedure were performed and confirmed by me, the nursing staff, and the patient. Time-out conducted as per Joint Commission's Universal Protocol (UP.01.01.01). Time: 0833 Start Time: 0835 hrs.  Description of Procedure:          Ultrasound Setup: The ultrasound machine was prepared and set to a linear high-frequency transducer with an appropriate frequency for soft tissue imaging. The probe was covered with sterile gel and draped with a sterile cover.  Identification of Target Structures: The ilioinguinal nerve was located using ultrasound guidance. The nerve was identified as it runs along the anterior abdominal wall, medial and slightly inferior to the anterior superior iliac spine  Needle Insertion: A sterile 22-gauge, 50mm needle was introduced under real-time ultrasound guidance. The needle was advanced in a plane parallel to the transducer, ensuring it was directed toward the ilioinguinal nerve. Aspiration was performed to ensure no vascular structures were punctured.  Injection of Local Anesthetic: Once  the needle tip was confirmed to be in close proximity to the ilioinguinal nerve, 20 cc solution made of 19cc of 0.2% ropivacaine , 1 cc of Decadron  10 mg/cc was injected under continuous ultrasound visualization. The spread of the local anesthetic was monitored to ensure adequate coverage of the target nerve.  Confirmation of Block: The spread of the anesthetic around the ilioinguinal nerve was confirmed by the change in tissue echogenicity.  No complications were noted during the injection process.  Vitals:   07/01/23 0830 07/01/23 0835 07/01/23 0840 07/01/23 0845  BP: 119/81 126/85 125/73 107/73  Pulse:      Resp: 19 15 12 11   Temp:      TempSrc:      SpO2: 99% 99% 100% 99%  Weight:      Height:         Start Time: 0835 hrs. End Time: 0845 hrs.      Materials:  Needle(s) Type: Ultrasound PAJUNK needle Gauge: 22G Length: 3.5-in   Imaging Guidance (Non-Spinal):          Type of Imaging Technique: Fluoroscopy Guidance (Non-Spinal) Indication(s): Fluoroscopy guidance for needle placement to enhance accuracy in procedures requiring precise needle localization for targeted delivery of medication in or near specific anatomical locations not easily accessible without such real-time imaging assistance. Exposure Time: Please see nurses notes. Contrast: Before injecting any contrast, we confirmed that the patient did not have an allergy to iodine, shellfish, or radiological contrast. Once satisfactory needle placement was completed at the desired level, radiological contrast was injected. Contrast injected under live fluoroscopy. No contrast complications. See chart for type and volume of contrast used. Fluoroscopic Guidance: I was personally present during the use of fluoroscopy. Tunnel Vision Technique used to obtain the best possible view of the target area. Parallax error corrected before commencing the procedure. Direction-depth-direction technique used to introduce the needle under  continuous pulsed fluoroscopy. Once target was reached, antero-posterior, oblique, and lateral fluoroscopic projection used confirm needle placement in all planes. Images permanently stored in EMR. Interpretation: I personally interpreted the imaging intraoperatively. Adequate needle placement confirmed in multiple planes. Appropriate spread of contrast into desired area was observed. No evidence of afferent or efferent intravascular uptake. Permanent images saved into the patient's record.  Antibiotic Prophylaxis:   Anti-infectives (From admission, onward)    None      Indication(s): None identified  Post-operative Assessment:  Post-procedure Vital Signs:  Pulse/HCG Rate: 7073 Temp: 98.1 F (36.7 C) Resp: 11 BP: 107/73 SpO2: 99 %  EBL: None  Complications: No immediate post-treatment complications observed by team, or reported by patient.  Note: The patient tolerated the entire procedure well. A repeat set of vitals were taken after the procedure and the patient was kept under observation following institutional policy, for this type of procedure. Post-procedural neurological assessment was performed, showing return to baseline, prior to discharge. The patient was provided with post-procedure discharge instructions, including a section on how to identify potential problems. Should any problems arise concerning this procedure, the patient was given instructions to immediately contact us , at any time, without hesitation. In any case, we plan to contact the patient by telephone for a follow-up status report regarding this interventional procedure.  Comments:  No additional relevant information.  Plan of Care (POC)  Orders:  No orders of the defined types were placed in this encounter.   Medications ordered for procedure: Meds ordered this encounter  Medications   lidocaine  (XYLOCAINE ) 2 % (with pres) injection 400 mg   ropivacaine  (PF) 2 mg/mL (0.2%) (NAROPIN ) injection 18 mL    dexamethasone  (DECADRON ) injection 10 mg   diazepam  (VALIUM ) tablet 10 mg    Make sure Flumazenil is available in the pyxis when using this medication. If oversedation occurs, administer 0.2 mg IV over 15 sec. If after 45 sec no response, administer 0.2 mg again over 1 min; may repeat at 1 min intervals; not to exceed 4  doses (1 mg)   Medications administered: We administered lidocaine , ropivacaine  (PF) 2 mg/mL (0.2%), dexamethasone , and diazepam .  See the medical record for exact dosing, route, and time of administration.  Follow-up plan:   Return for Keep sch. appt.     Recent Visits Date Type Provider Dept  04/30/23 Office Visit Cephus Collin, MD [facility]  Showing recent visits within past 90 days and meeting all other requirements Today's Visits Date Type Provider Dept  07/01/23 Procedure visit Cephus Collin, MD [facility]  Showing today's visits and meeting all other requirements Future Appointments Date Type Provider Dept  07/23/23 Appointment Patel, Seema K, NP [facility]  Showing future appointments within next 90 days and meeting all other requirements  Disposition: Discharge home  Discharge (Date  Time): 07/01/2023; 0850 hrs.   Primary Care Physician: Cannady, Jolene T, NP Location: [facility] Note by: Cephus Collin, MD (TTS technology used. I apologize for any typographical errors that were not detected and corrected.) Date: 07/01/2023; Time: 9:37 AM  Disclaimer:  Medicine is not an Visual merchandiser. The only guarantee in medicine is that nothing is guaranteed. It is important to note that the decision to proceed with this intervention was based on the information collected from the patient. The Data and conclusions were drawn from the patient's questionnaire, the interview, and the physical examination. Because the information was provided in large part by the patient, it cannot be guaranteed that it has not been  purposely or unconsciously manipulated. Every effort has been made to obtain as much relevant data as possible for this evaluation. It is important to note that the conclusions that lead to this procedure are derived in large part from the available data. Always take into account that the treatment will also be dependent on availability of resources and existing treatment guidelines, considered by other Pain Management Practitioners as being common knowledge and practice, at the time of the intervention. For Medico-Legal purposes, it is also important to point out that variation in procedural techniques and pharmacological choices are the acceptable norm. The indications, contraindications, technique, and results of the above procedure should only be interpreted and judged by a Board-Certified Interventional Pain Specialist with extensive familiarity and expertise in the same exact procedure and technique.

## 2023-07-01 NOTE — Assessment & Plan Note (Signed)
 Chronic, ongoing since middle school.  Will continue Adderall 20 MG BID as has been on for years with benefit.  Educated him on office rules for controlled substances.  He has signed controlled substance agreement, UDS due next 10/29/23. Agrees to every 3 month visits.  Refills sent in today -- 3 refills predated.
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
## 2023-07-01 NOTE — Assessment & Plan Note (Signed)
 BMI 32.83.  Would benefit from injectables, but currently not covered and not affordable OOP.  Oral medications discussed, some may interact with current medications but could be considered in future.  Recommended eating smaller high protein, low fat meals more frequently and exercising 30 mins a day 5 times a week with a goal of 10-15lb weight loss in the next 3 months. Patient voiced their understanding and motivation to adhere to these recommendations.
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
## 2023-07-02 ENCOUNTER — Telehealth: Payer: Self-pay

## 2023-07-02 NOTE — Telephone Encounter (Signed)
 Attempt to contact for post-procedure follow-up. No answer and LVM.

## 2023-07-03 ENCOUNTER — Other Ambulatory Visit

## 2023-07-03 DIAGNOSIS — R5383 Other fatigue: Secondary | ICD-10-CM

## 2023-07-04 ENCOUNTER — Ambulatory Visit: Payer: Self-pay | Admitting: Nurse Practitioner

## 2023-07-04 LAB — COMPREHENSIVE METABOLIC PANEL WITH GFR
ALT: 29 IU/L (ref 0–44)
AST: 28 IU/L (ref 0–40)
Albumin: 4.6 g/dL (ref 4.1–5.1)
Alkaline Phosphatase: 117 IU/L (ref 44–121)
BUN/Creatinine Ratio: 21 — ABNORMAL HIGH (ref 9–20)
BUN: 22 mg/dL (ref 6–24)
Bilirubin Total: 0.3 mg/dL (ref 0.0–1.2)
CO2: 22 mmol/L (ref 20–29)
Calcium: 9.8 mg/dL (ref 8.7–10.2)
Chloride: 101 mmol/L (ref 96–106)
Creatinine, Ser: 1.07 mg/dL (ref 0.76–1.27)
Globulin, Total: 2.8 g/dL (ref 1.5–4.5)
Glucose: 87 mg/dL (ref 70–99)
Potassium: 4.3 mmol/L (ref 3.5–5.2)
Sodium: 140 mmol/L (ref 134–144)
Total Protein: 7.4 g/dL (ref 6.0–8.5)
eGFR: 90 mL/min/{1.73_m2} (ref >59)

## 2023-07-04 NOTE — Progress Notes (Signed)
 Contacted via MyChart -- lab only visit in 4 weeks please  Good afternoon Darren Miller, your labs have returned: - Kidney function, creatinine and eGFR, remains normal, as is liver function, AST and ALT.  - Testosterone total is low, <300, we will need to check this outpatient again in 4 weeks and if remains low we will send you to urology since you are having symptoms with this.  Any questions? Keep being stellar!!  Thank you for allowing me to participate in your care.  I appreciate you. Kindest regards, Fallon Howerter

## 2023-07-05 NOTE — Progress Notes (Signed)
 Lab appt scheduled.

## 2023-07-06 LAB — TESTOSTERONE,FREE AND TOTAL
Testosterone, Free: 7 pg/mL (ref 6.8–21.5)
Testosterone: 229 ng/dL — ABNORMAL LOW (ref 264–916)

## 2023-07-11 ENCOUNTER — Other Ambulatory Visit: Payer: Self-pay

## 2023-07-18 ENCOUNTER — Other Ambulatory Visit: Payer: Self-pay

## 2023-07-23 ENCOUNTER — Ambulatory Visit: Attending: Nurse Practitioner | Admitting: Nurse Practitioner

## 2023-07-23 ENCOUNTER — Other Ambulatory Visit: Payer: Self-pay

## 2023-07-23 ENCOUNTER — Encounter: Payer: Self-pay | Admitting: Nurse Practitioner

## 2023-07-23 VITALS — BP 120/93 | HR 78 | Temp 98.5°F | Resp 20 | Ht 70.0 in | Wt 222.0 lb

## 2023-07-23 DIAGNOSIS — G2581 Restless legs syndrome: Secondary | ICD-10-CM | POA: Insufficient documentation

## 2023-07-23 DIAGNOSIS — G894 Chronic pain syndrome: Secondary | ICD-10-CM | POA: Insufficient documentation

## 2023-07-23 DIAGNOSIS — G5782 Other specified mononeuropathies of left lower limb: Secondary | ICD-10-CM | POA: Insufficient documentation

## 2023-07-23 DIAGNOSIS — G5792 Unspecified mononeuropathy of left lower limb: Secondary | ICD-10-CM | POA: Diagnosis not present

## 2023-07-23 DIAGNOSIS — M792 Neuralgia and neuritis, unspecified: Secondary | ICD-10-CM | POA: Insufficient documentation

## 2023-07-23 DIAGNOSIS — R1032 Left lower quadrant pain: Secondary | ICD-10-CM | POA: Insufficient documentation

## 2023-07-23 DIAGNOSIS — F4310 Post-traumatic stress disorder, unspecified: Secondary | ICD-10-CM | POA: Insufficient documentation

## 2023-07-23 MED ORDER — OXYCODONE-ACETAMINOPHEN 10-325 MG PO TABS
1.0000 | ORAL_TABLET | Freq: Two times a day (BID) | ORAL | 0 refills | Status: AC | PRN
  Filled 2023-08-19: qty 60, 30d supply, fill #0

## 2023-07-23 MED ORDER — OXYCODONE-ACETAMINOPHEN 10-325 MG PO TABS
1.0000 | ORAL_TABLET | Freq: Two times a day (BID) | ORAL | 0 refills | Status: DC | PRN
  Filled 2023-10-18: qty 60, 30d supply, fill #0

## 2023-07-23 MED ORDER — OXYCODONE-ACETAMINOPHEN 10-325 MG PO TABS
1.0000 | ORAL_TABLET | Freq: Two times a day (BID) | ORAL | 0 refills | Status: AC | PRN
  Filled 2023-09-18 – 2023-09-19 (×2): qty 60, 30d supply, fill #0

## 2023-07-23 NOTE — Progress Notes (Signed)
 PROVIDER NOTE: Interpretation of information contained herein should be left to medically-trained personnel. Specific patient instructions are provided elsewhere under Patient Instructions section of medical record. This document was created in part using AI and STT-dictation technology, any transcriptional errors that may result from this process are unintentional.  Patient: Darren Miller  Service: E/M   PCP: Valerio Melanie DASEN, NP  DOB: Apr 25, 1985  DOS: 07/23/2023  Provider: Emmy MARLA Blanch, NP  MRN: [id number]  Delivery: Face-to-face  Specialty: Interventional Pain Management  Type: Established Patient  Setting: Ambulatory outpatient facility  Specialty designation: 09  Referring Prov.: Valerio Melanie DASEN, NP  Location: Outpatient office facility       History of present illness (HPI) Darren Miller, a 38 y.o. year old male, is here today because of his Chronic pain syndrome [G89.4]. Darren Miller primary complain today is Abdominal Pain (Hip pain, radiates into groin. )  Pertinent problems: Darren Miller has Posttraumatic stress disorder (PTSD); Ilioinguinal neuralgia of the left side; Abdominal pain, left lower quadrant; S/P repair of ventral hernia, and Chronic pain syndrome on their pertinent problem list.  Pain Assessment: Severity of Chronic pain is reported as a 5 /10. Location: Groin Left/Into left hip and groin. Onset: More than a month ago. Quality: Constant, Burning, Stabbing. Timing: Constant. Modifying factor(s): Medication eases pain. Vitals:  height is 5' 10 (1.778 m) and weight is 222 lb (100.7 kg). His oral temperature is 98.5 F (36.9 C). His blood pressure is 120/93 (abnormal) and his pulse is 78. His respiration is 20 and oxygen saturation is 99%.  BMI: Estimated body mass index is 31.85 kg/m as calculated from the following:   Height as of this encounter: 5' 10 (1.778 m).   Weight as of this encounter: 222 lb (100.7 kg).  Last encounter: 04/30/2023 Last procedure:  07/01/2023  Reason for encounter: both, medication management and post-procedure evaluation and assessment. No change in medical history since last visit.  Patient's pain is at baseline.  Patient continues multimodal pain regimen as prescribed.  States that it provides pain relief and improvement in functional status.   The patient experienced approximately 50% pain relief from ilioinguinal nerve block from July 01, 2023.   Procedure Procedure:           Anesthesia, Analgesia, Anxiolysis:  Type: Ilioinguinal Nerve Block #3  Primary Purpose: Diagnostic Region: ASIS, Groin Region Target Area: Lateral one third of the line between the left ASIS and left umbilicus Approach: Anterior approach Laterality: Left   Anesthesia: Local (1-2% Lidocaine )  Anxiolysis: Oral Valium  10 mg Guidance: Ultrasound             Position: Supine    1. Ilioinguinal neuralgia of left side   2. Genitofemoral neuralgia of left side   3. Abdominal pain, left lower quadrant   4. Chronic pain syndrome     NAS-11 Pain score:        Pre-procedure: 6 /10        Post-procedure: 0-No pain/10   Post-Procedure Evaluation   Effectiveness:  Initial hour after procedure: 100 % . Subsequent 4-6 hours post-procedure: 90 % . Analgesia past initial 6 hours: 50 % . Ongoing improvement:  Analgesic:  50%   Pharmacotherapy Assessment   Oxycodone -acetaminophen  (Percocet) 10-3 25 mg every 12 hours as needed for pain. MME=30  Monitoring: [place] PMP: PDMP reviewed during this encounter.       Pharmacotherapy: No side-effects or adverse reactions reported. Compliance: No problems identified. Effectiveness: Clinically acceptable.  Erlene Doyal SAUN[place], [place]  07/23/2023  9:43 AM  Sign when Signing Visit Nursing Pain Medication Assessment:  Safety precautions to be maintained throughout the outpatient stay will include: orient to surroundings, keep bed in low position, maintain call bell within reach at all times, provide assistance with  transfer out of bed and ambulation.  Medication Inspection Compliance: Pill count conducted under aseptic conditions, in front of the patient. Neither the pills nor the bottle was removed from the patient's sight at any time. Once count was completed pills were immediately returned to the patient in their original bottle.  Medication: Oxycodone /APAP Pill/Patch Count: 50 of 60 pills/patches remain Pill/Patch Appearance: Markings consistent with prescribed medication Bottle Appearance: Standard pharmacy container. Clearly labeled. Filled Date: 7 / 3 / 2025 Last Medication intake:  Today    UDS:  Summary  Date Value Ref Range Status  04/30/2023 FINAL  Final    Comment:    ==================================================================== [facility] (MW) ==================================================================== Test                             Result       Flag       Units  Drug Present and Declared for Prescription Verification   Amphetamine                     227          EXPECTED   ng/mg creat    Amphetamine  is available as a schedule II prescription drug.    Oxycodone                       437          EXPECTED   ng/mg creat   Oxymorphone                    467          EXPECTED   ng/mg creat   Noroxycodone                   493          EXPECTED   ng/mg creat   Noroxymorphone                 169          EXPECTED   ng/mg creat    Sources of oxycodone  are scheduled prescription medications.    Oxymorphone, noroxycodone, and noroxymorphone are expected    metabolites of oxycodone . Oxymorphone is also available as a    scheduled prescription medication.  Drug Present not Declared for Prescription Verification   Carboxy-THC                    11           UNEXPECTED ng/mg creat    Carboxy-THC is a metabolite of tetrahydrocannabinol (THC). Source of    THC is most commonly herbal marijuana or marijuana-based products,    but THC is also present in a scheduled  prescription medication.    Trace amounts of THC can be present in hemp and cannabidiol (CBD)    products. This test is not intended to distinguish between delta-9-    tetrahydrocannabinol, the predominant form of THC in most herbal or    marijuana-based products, and delta-8-tetrahydrocannabinol.  ==================================================================== Test  Result    Flag   Units      Ref Range   Creatinine              259              mg/dL      >=79 ==================================================================== Declared Medications:  The flagging and interpretation on this report are based on the  following declared medications.  Unexpected results may arise from  inaccuracies in the declared medications.   **Note: The testing scope of this panel includes these medications:   Amphetamine  (Adderall)  Oxycodone  (Percocet)   **Note: The testing scope of this panel does not include the  following reported medications:   Acetaminophen  (Percocet)  Acetaminophen  (Tylenol )  Albuterol  (Ventolin  HFA)  Atropine  (Lomotil )  Bacitracin   Bacitracin  (Polysporin )  Baclofen  (Lioresal )  Bisacodyl  (Dulcolax)  Bupropion  (Wellbutrin )  Celecoxib  (Celebrex )  Clobetasol  (Temovate )  Crisaborole  (Eucrisa )  Diphenoxylate  (Lomotil )  Famotidine  (Pepcid )  Methocarbamol  (Robaxin )  Neomycin   Pantoprazole  (Protonix )  Polymyxin B  (Polymyxin)  Polymyxin B  (Polysporin )  Prazosin  (Minipress )  Pregabalin  (Lyrica )  Ropinirole  (Requip )  Simethicone  (Mylicon)  Sumatriptan  (Imitrex )  Tadalafil  (Cialis )  Trazodone  (Desyrel )  Triamcinolone  (Kenalog )  Zolpidem  (Ambien ) ==================================================================== For clinical consultation, please call [phone]. ====================================================================     No results found for: CBDTHCR No results found for: D8THCCBX No results found for:  D9THCCBX  ROS  Constitutional: Denies any fever or chills Gastrointestinal: No reported hemesis, hematochezia, vomiting, or acute GI distress Musculoskeletal:  left lower quadrant pain, hip pain radiate to groin on left side  Neurological: No reported episodes of acute onset apraxia, aphasia, dysarthria, agnosia, amnesia, paralysis, loss of coordination, or loss of consciousness  Medication Review  Crisaborole , Medi-First Triple Antibiotic, SUMAtriptan , acetaminophen , albuterol , amphetamine -dextroamphetamine , bacitracin , bacitracin -polymyxin b , buPROPion , celecoxib , clobetasol  ointment, famotidine , oxyCODONE -acetaminophen , pantoprazole , prazosin , pregabalin , rOPINIRole , simethicone , tadalafil , triamcinolone  cream, and zolpidem   History Review  Allergy: Darren Miller is allergic to morphine , silicone, and tape. Drug: Darren Miller  reports current drug use. Drug: Marijuana. Alcohol:  reports that he does not currently use alcohol after a past usage of about 14.0 standard drinks of alcohol per week. Tobacco:  reports that he has quit smoking. His smoking use included cigarettes. He has never used smokeless tobacco. Social: Darren Miller  reports that he has quit smoking. His smoking use included cigarettes. He has never used smokeless tobacco. He reports that he does not currently use alcohol after a past usage of about 14.0 standard drinks of alcohol per week. He reports current drug use. Drug: Marijuana. Medical:  has a past medical history of ADHD (attention deficit hyperactivity disorder), Allergy to alpha-gal, Anxiety, Depression, and GERD (gastroesophageal reflux disease). Surgical: Darren Miller  has a past surgical history that includes Appendectomy; Colonoscopy with propofol  (N/A, 11/19/2017); Esophagogastroduodenoscopy (egd) with propofol  (N/A, 11/19/2017); and Knee arthroscopy with meniscal repair (Right, 07/16/2019). Family: family history includes ADD / ADHD in his paternal aunt and paternal  uncle; Alcohol abuse in his father, paternal grandfather, paternal grandmother, and paternal uncle; Thyroid  disease in his mother.  Laboratory Chemistry Profile   Renal Lab Results  Component Value Date   BUN 22 07/03/2023   CREATININE 1.07 07/03/2023   LABCREA 11.1 (L) 10/30/2022   BCR 21 (H) 07/03/2023   GFRAA >60 07/20/2019   GFRNONAA >60 02/04/2021    Hepatic Lab Results  Component Value Date   AST 28 07/03/2023   ALT 29 07/03/2023   ALBUMIN 4.6 07/03/2023   ALKPHOS 117 07/03/2023    Electrolytes  Lab Results  Component Value Date   NA 140 07/03/2023   K 4.3 07/03/2023   CL 101 07/03/2023   CALCIUM 9.8 07/03/2023   MG 2.1 02/04/2021    Bone Lab Results  Component Value Date   TESTOFREE 7.0 07/03/2023   TESTOSTERONE  229 (L) 07/03/2023    Inflammation (CRP: Acute Phase) (ESR: Chronic Phase) Lab Results  Component Value Date   LATICACIDVEN 1.3 02/02/2021         Note: Above Lab results reviewed.  Recent Imaging Review  DG Chest 2 View CLINICAL DATA:  Fever, cough, and shortness of breath for 3 days.  EXAM: CHEST - 2 VIEW  COMPARISON:  02/02/2021  FINDINGS: The heart size and mediastinal contours are within normal limits. Both lungs are clear. No pneumothorax or pleural effusion visualized.  IMPRESSION: No active cardiopulmonary disease.  Electronically Signed   By: Norleen DELENA Kil M.D.   On: 05/15/2023 08:45 Note: Reviewed        Physical Exam  Vitals: BP (!) 120/93 (BP Location: Right Arm)   Pulse 78   Temp 98.5 F (36.9 C) (Oral)   Resp 20   Ht 5' 10 (1.778 m)   Wt 222 lb (100.7 kg)   SpO2 99%   BMI 31.85 kg/m  BMI: Estimated body mass index is 31.85 kg/m as calculated from the following:   Height as of this encounter: 5' 10 (1.778 m).   Weight as of this encounter: 222 lb (100.7 kg). Ideal: Ideal body weight: 73 kg (160 lb 15 oz) Adjusted ideal body weight: 84.1 kg (185 lb 5.8 oz) General appearance: Well nourished, well developed,  and well hydrated. In no apparent acute distress Mental status: Alert, oriented x 3 (person, place, & time)       Respiratory: No evidence of acute respiratory distress Eyes: PERLA   Assessment   Diagnosis Status  1. Chronic pain syndrome   2. Ilioinguinal neuralgia of left side   3. Genitofemoral neuralgia of left side   4. Abdominal pain, left lower quadrant   5. Neuropathic pain   6. Restless leg syndrome   7. Post traumatic stress disorder (PTSD)    Controlled Controlled Controlled   Updated Problems: Problem  Restless Leg Syndrome    Plan of Care  Problem-specific:  Assessment and Plan We will continue on current medication regimen.  Prescribing drug monitoring (PDMP) reviewed; findings consistent with the use of prescribed medication and no evidence of narcotic misuse or abuse.  Urine drug screening (UDS) up-to-date.  Schedule follow-up in 90 days for medication management.  No other new issues or problems reported this visit.   Darren Miller has a current medication list which includes the following long-term medication(s): albuterol , amphetamine -dextroamphetamine , [START ON 08/16/2023] amphetamine -dextroamphetamine , [START ON 09/16/2023] amphetamine -dextroamphetamine , bupropion , famotidine , pantoprazole , prazosin , pregabalin , ropinirole , simethicone , sumatriptan , tadalafil , and zolpidem .  Pharmacotherapy (Medications Ordered): Meds ordered this encounter  Medications   oxyCODONE -acetaminophen  (PERCOCET) 10-325 MG tablet    Sig: Take 1 tablet by mouth every 12 (twelve) hours as needed for pain. Must last 30 days.    Dispense:  60 tablet    Refill:  0    Chronic Pain: STOP Act (Not applicable) Fill 1 day early if closed on refill date. Avoid benzodiazepines within 8 hours of opioids   oxyCODONE -acetaminophen  (PERCOCET) 10-325 MG tablet    Sig: Take 1 tablet by mouth every 12 (twelve) hours as needed for pain. Must last 30 days.    Dispense:  60 tablet  Refill:  0     Chronic Pain: STOP Act (Not applicable) Fill 1 day early if closed on refill date. Avoid benzodiazepines within 8 hours of opioids   oxyCODONE -acetaminophen  (PERCOCET) 10-325 MG tablet    Sig: Take 1 tablet by mouth every 12 (twelve) hours as needed for pain. Must last 30 days.    Dispense:  60 tablet    Refill:  0    Chronic Pain: STOP Act (Not applicable) Fill 1 day early if closed on refill date. Avoid benzodiazepines within 8 hours of opioids   Orders:  No orders of the defined types were placed in this encounter.       Return in about 3 months (around 10/23/2023) for (F2F), (MM), Emmy Blanch NP.    Recent Visits Date Type Provider Dept  07/01/23 Procedure visit Marcelino Nurse, MD [facility]  04/30/23 Office Visit Marcelino Nurse, MD [facility]  Showing recent visits within past 90 days and meeting all other requirements Today's Visits Date Type Provider Dept  07/23/23 Office Visit Zymiere Trostle K, NP [facility]  Showing today's visits and meeting all other requirements Future Appointments Date Type Provider Dept  10/21/23 Appointment Jevaeh Shams K, NP [facility]  Showing future appointments within next 90 days and meeting all other requirements  I discussed the assessment and treatment plan with the patient. The patient was provided an opportunity to ask questions and all were answered. The patient agreed with the plan and demonstrated an understanding of the instructions.  Patient advised to call back or seek an in-person evaluation if the symptoms or condition worsens.  Duration of encounter: 30 minutes.  Total time on encounter, as per AMA guidelines included both the face-to-face and non-face-to-face time personally spent by the physician and/or other qualified health care professional(s) on the day of the encounter (includes time in activities that require the physician or other qualified health care professional and does not  include time in activities normally performed by clinical staff). Physician's time may include the following activities when performed: Preparing to see the patient (e.g., pre-charting review of records, searching for previously ordered imaging, lab work, and nerve conduction tests) Review of prior analgesic pharmacotherapies. Reviewing PMP Interpreting ordered tests (e.g., lab work, imaging, nerve conduction tests) Performing post-procedure evaluations, including interpretation of diagnostic procedures Obtaining and/or reviewing separately obtained history Performing a medically appropriate examination and/or evaluation Counseling and educating the patient/family/caregiver Ordering medications, tests, or procedures Referring and communicating with other health care professionals (when not separately reported) Documenting clinical information in the electronic or other health record Independently interpreting results (not separately reported) and communicating results to the patient/ family/caregiver Care coordination (not separately reported)  Note by: Lakysha Kossman K Jamarion [place], NP (TTS and AI technology used. I apologize for any typographical errors that were not detected and corrected.) Date: 07/23/2023; Time: 10:13 AM

## 2023-07-23 NOTE — Progress Notes (Signed)
 Nursing Pain Medication Assessment:  Safety precautions to be maintained throughout the outpatient stay will include: orient to surroundings, keep bed in low position, maintain call bell within reach at all times, provide assistance with transfer out of bed and ambulation.  Medication Inspection Compliance: Pill count conducted under aseptic conditions, in front of the patient. Neither the pills nor the bottle was removed from the patient's sight at any time. Once count was completed pills were immediately returned to the patient in their original bottle.  Medication: Oxycodone /APAP Pill/Patch Count: 50 of 60 pills/patches remain Pill/Patch Appearance: Markings consistent with prescribed medication Bottle Appearance: Standard pharmacy container. Clearly labeled. Filled Date: 7 / 3 / 2025 Last Medication intake:  Today

## 2023-08-02 ENCOUNTER — Other Ambulatory Visit

## 2023-08-02 DIAGNOSIS — R5383 Other fatigue: Secondary | ICD-10-CM | POA: Diagnosis not present

## 2023-08-04 LAB — TESTOSTERONE,FREE AND TOTAL
Testosterone, Free: 7.4 pg/mL (ref 6.8–21.5)
Testosterone: 166 ng/dL — ABNORMAL LOW (ref 264–916)

## 2023-08-05 ENCOUNTER — Ambulatory Visit: Payer: Self-pay | Admitting: Family Medicine

## 2023-08-05 ENCOUNTER — Encounter: Payer: Self-pay | Admitting: Nurse Practitioner

## 2023-08-05 DIAGNOSIS — R7989 Other specified abnormal findings of blood chemistry: Secondary | ICD-10-CM

## 2023-08-13 DIAGNOSIS — F4310 Post-traumatic stress disorder, unspecified: Secondary | ICD-10-CM | POA: Diagnosis not present

## 2023-08-13 DIAGNOSIS — K432 Incisional hernia without obstruction or gangrene: Secondary | ICD-10-CM | POA: Diagnosis not present

## 2023-08-13 NOTE — Progress Notes (Signed)
 Darren Miller has work restrictions, specifically to avoid lifting more than 15 pounds for the next 90 days.

## 2023-08-16 ENCOUNTER — Other Ambulatory Visit: Payer: Self-pay

## 2023-08-19 ENCOUNTER — Other Ambulatory Visit: Payer: Self-pay

## 2023-08-28 DIAGNOSIS — T1490XA Injury, unspecified, initial encounter: Secondary | ICD-10-CM | POA: Diagnosis not present

## 2023-08-28 DIAGNOSIS — R109 Unspecified abdominal pain: Secondary | ICD-10-CM | POA: Diagnosis not present

## 2023-08-28 DIAGNOSIS — K432 Incisional hernia without obstruction or gangrene: Secondary | ICD-10-CM | POA: Diagnosis not present

## 2023-08-29 ENCOUNTER — Ambulatory Visit
Admission: EM | Admit: 2023-08-29 | Discharge: 2023-08-29 | Disposition: A | Discharge status: Home / Self Care | Attending: Family Medicine | Admitting: Family Medicine

## 2023-08-29 ENCOUNTER — Ambulatory Visit (INDEPENDENT_AMBULATORY_CARE_PROVIDER_SITE_OTHER)

## 2023-08-29 ENCOUNTER — Ambulatory Visit (INDEPENDENT_AMBULATORY_CARE_PROVIDER_SITE_OTHER): Admitting: Urology

## 2023-08-29 ENCOUNTER — Encounter: Payer: Self-pay | Admitting: *Deleted

## 2023-08-29 VITALS — BP 120/73 | HR 69 | Wt 224.0 lb

## 2023-08-29 DIAGNOSIS — E291 Testicular hypofunction: Secondary | ICD-10-CM | POA: Diagnosis not present

## 2023-08-29 DIAGNOSIS — M79642 Pain in left hand: Secondary | ICD-10-CM

## 2023-08-29 DIAGNOSIS — S62627A Displaced fracture of medial phalanx of left little finger, initial encounter for closed fracture: Secondary | ICD-10-CM | POA: Diagnosis not present

## 2023-08-29 DIAGNOSIS — N529 Male erectile dysfunction, unspecified: Secondary | ICD-10-CM

## 2023-08-29 MED ORDER — CELECOXIB 200 MG PO CAPS: 400.0000 mg | ORAL_CAPSULE | Freq: Every day | ORAL | 0 refills | Status: DC

## 2023-08-29 MED ORDER — CLOMIPHENE CITRATE 50 MG PO TABS: 25.0000 mg | ORAL_TABLET | Freq: Every day | ORAL | 6 refills | Status: DC

## 2023-08-29 NOTE — ED Triage Notes (Signed)
 Patient states he fell down a few steps a few weeks ago with pain/injury to left ring and pinky fingers.  He states pinky was sitting out to the side and he put it back in place himself, ring finger was swollen and he had to cut a ring off.  No OTC pain meds but still with pain with movement

## 2023-08-29 NOTE — Discharge Instructions (Addendum)
 There is a small avulsion fracture along the middle of your little finger. Follow up with [facility] or [facility] for fracture management. Continue taking Percocet as needed for pain and a pick up the Celebrex  for additional pain relief.
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
## 2023-09-04 ENCOUNTER — Other Ambulatory Visit: Payer: Self-pay

## 2023-09-04 DIAGNOSIS — S62607A Fracture of unspecified phalanx of left little finger, initial encounter for closed fracture: Secondary | ICD-10-CM | POA: Diagnosis not present

## 2023-09-04 DIAGNOSIS — S63287A Dislocation of proximal interphalangeal joint of left little finger, initial encounter: Secondary | ICD-10-CM | POA: Diagnosis not present

## 2023-09-04 DIAGNOSIS — S62605A Fracture of unspecified phalanx of left ring finger, initial encounter for closed fracture: Secondary | ICD-10-CM | POA: Diagnosis not present

## 2023-09-04 DIAGNOSIS — S63285A Dislocation of proximal interphalangeal joint of left ring finger, initial encounter: Secondary | ICD-10-CM | POA: Diagnosis not present

## 2023-09-04 MED ORDER — METHYLPREDNISOLONE 4 MG PO TBPK
ORAL_TABLET | ORAL | 0 refills | Status: DC
  Filled 2023-09-04: qty 21, 6d supply, fill #0

## 2023-09-17 ENCOUNTER — Other Ambulatory Visit: Payer: Self-pay

## 2023-09-18 ENCOUNTER — Other Ambulatory Visit: Payer: Self-pay

## 2023-09-19 ENCOUNTER — Other Ambulatory Visit: Payer: Self-pay

## 2023-09-23 DIAGNOSIS — L400 Psoriasis vulgaris: Secondary | ICD-10-CM | POA: Diagnosis not present

## 2023-09-29 NOTE — Patient Instructions (Signed)
 Healthy Eating, Adult Healthy eating may help you get and keep a healthy body weight, reduce the risk of chronic disease, and live a long and productive life. It is important to follow a healthy eating pattern. Your nutritional and calorie needs should be met mainly by different nutrient-rich foods. What are tips for following this plan? Reading food labels Read labels and choose the following: Reduced or low sodium products. Juices with 100% fruit juice. Foods with low saturated fats (<3 g per serving) and high polyunsaturated and monounsaturated fats. Foods with whole grains, such as whole wheat, cracked wheat, brown rice, and wild rice. Whole grains that are fortified with folic acid . This is recommended for females who are pregnant or who want to become pregnant. Read labels and do not eat or drink the following: Foods or drinks with added sugars. These include foods that contain brown sugar, corn sweetener, corn syrup, dextrose , fructose, glucose, high-fructose corn syrup, honey, invert sugar, lactose, malt syrup, maltose, molasses, raw sugar, sucrose, trehalose, or turbinado sugar. Limit your intake of added sugars to less than 10% of your total daily calories. Do not eat more than the following amounts of added sugar per day: 6 teaspoons (25 g) for females. 9 teaspoons (38 g) for males. Foods that contain processed or refined starches and grains. Refined grain products, such as white flour, degermed cornmeal, white bread, and white rice. Shopping Choose nutrient-rich snacks, such as vegetables, whole fruits, and nuts. Avoid high-calorie and high-sugar snacks, such as potato chips, fruit snacks, and candy. Use oil-based dressings and spreads on foods instead of solid fats such as butter, margarine, sour cream, or cream cheese. Limit pre-made sauces, mixes, and instant products such as flavored rice, instant noodles, and ready-made pasta. Try more plant-protein sources, such as tofu,  tempeh, black beans, edamame, lentils, nuts, and seeds. Explore eating plans such as the Mediterranean diet or vegetarian diet. Try heart-healthy dips made with beans and healthy fats like hummus and guacamole. Vegetables go great with these. Cooking Use oil to saut or stir-fry foods instead of solid fats such as butter, margarine, or lard. Try baking, boiling, grilling, or broiling instead of frying. Remove the fatty part of meats before cooking. Steam vegetables in water  or broth. Meal planning  At meals, imagine dividing your plate into fourths: One-half of your plate is fruits and vegetables. One-fourth of your plate is whole grains. One-fourth of your plate is protein, especially lean meats, poultry, eggs, tofu, beans, or nuts. Include low-fat dairy as part of your daily diet. Lifestyle Choose healthy options in all settings, including home, work, school, restaurants, or stores. Prepare your food safely: Wash your hands after handling raw meats. Where you prepare food, keep surfaces clean by regularly washing with hot, soapy water . Keep raw meats separate from ready-to-eat foods, such as fruits and vegetables. Cook seafood, meat, poultry, and eggs to the recommended temperature. Get a food thermometer. Store foods at safe temperatures. In general: Keep cold foods at 34F (4.4C) or below. Keep hot foods at 134F (60C) or above. Keep your freezer at [facility] (-17.8C) or below. Foods are not safe to eat if they have been between the temperatures of 40-134F (4.4-60C) for more than 2 hours. What foods should I eat? Fruits Aim to eat 1-2 cups of fresh, canned (in natural juice), or frozen fruits each day. One cup of fruit equals 1 small apple, 1 large banana, 8 large strawberries, 1 cup (237 g) canned fruit,  cup (82 g) dried fruit,  or 1 cup (240 mL) 100% juice. Vegetables Aim to eat 2-4 cups of fresh and frozen vegetables each day, including different varieties and colors. One cup  of vegetables equals 1 cup (91 g) broccoli or cauliflower florets, 2 medium carrots, 2 cups (150 g) raw, leafy greens, 1 large tomato, 1 large bell pepper, 1 large sweet potato, or 1 medium white potato. Grains Aim to eat 5-10 ounce-equivalents of whole grains each day. Examples of 1 ounce-equivalent of grains include 1 slice of bread, 1 cup (40 g) ready-to-eat cereal, 3 cups (24 g) popcorn, or  cup (93 g) cooked rice. Meats and other proteins Try to eat 5-7 ounce-equivalents of protein each day. Examples of 1 ounce-equivalent of protein include 1 egg,  oz nuts (12 almonds, 24 pistachios, or 7 walnut halves), 1/4 cup (90 g) cooked beans, 6 tablespoons (90 g) hummus or 1 tablespoon (16 g) peanut butter. A cut of meat or fish that is the size of a deck of cards is about 3-4 ounce-equivalents (85 g). Of the protein you eat each week, try to have at least 8 sounce (227 g) of seafood. This is about 2 servings per week. This includes salmon, trout, herring, sardines, and anchovies. Dairy Aim to eat 3 cup-equivalents of fat-free or low-fat dairy each day. Examples of 1 cup-equivalent of dairy include 1 cup (240 mL) milk, 8 ounces (250 g) yogurt, 1 ounces (44 g) natural cheese, or 1 cup (240 mL) fortified soy milk. Fats and oils Aim for about 5 teaspoons (21 g) of fats and oils per day. Choose monounsaturated fats, such as canola and olive oils, mayonnaise made with olive oil or avocado oil, avocados, peanut butter, and most nuts, or polyunsaturated fats, such as sunflower, corn, and soybean oils, walnuts, pine nuts, sesame seeds, sunflower seeds, and flaxseed. Beverages Aim for 6 eight-ounce glasses of water  per day. Limit coffee to 3-5 eight-ounce cups per day. Limit caffeinated beverages that have added calories, such as soda and energy drinks. If you drink alcohol: Limit how much you have to: 0-1 drink a day if you are male. 0-2 drinks a day if you are male. Know how much alcohol is in your drink.  In the [place] one drink is one 12 oz bottle of beer (355 mL), one 5 oz glass of wine (148 mL), or one 1 oz glass of hard liquor (44 mL). Seasoning and other foods Try not to add too much salt to your food. Try using herbs and spices instead of salt. Try not to add sugar to food. This information is based on [place] nutrition guidelines. To learn more, visit [employer]. Exact amounts may vary. You may need different amounts. This information is not intended to replace advice given to you by your health care provider. Make sure you discuss any questions you have with your health care provider. Document Revised: 10/02/2021 Document Reviewed: 10/02/2021 Elsevier Patient Education  2024 [employer].

## 2023-10-03 ENCOUNTER — Telehealth: Admitting: Nurse Practitioner

## 2023-10-03 ENCOUNTER — Encounter: Payer: Self-pay | Admitting: Nurse Practitioner

## 2023-10-03 ENCOUNTER — Other Ambulatory Visit: Payer: Self-pay

## 2023-10-03 DIAGNOSIS — L400 Psoriasis vulgaris: Secondary | ICD-10-CM | POA: Diagnosis not present

## 2023-10-03 DIAGNOSIS — F5104 Psychophysiologic insomnia: Secondary | ICD-10-CM

## 2023-10-03 DIAGNOSIS — F4310 Post-traumatic stress disorder, unspecified: Secondary | ICD-10-CM

## 2023-10-03 DIAGNOSIS — F988 Other specified behavioral and emotional disorders with onset usually occurring in childhood and adolescence: Secondary | ICD-10-CM | POA: Diagnosis not present

## 2023-10-03 DIAGNOSIS — F330 Major depressive disorder, recurrent, mild: Secondary | ICD-10-CM | POA: Diagnosis not present

## 2023-10-03 MED ORDER — AMPHETAMINE-DEXTROAMPHETAMINE 20 MG PO TABS
20.0000 mg | ORAL_TABLET | Freq: Two times a day (BID) | ORAL | 0 refills | Status: DC
  Filled 2023-11-18: qty 60, 30d supply, fill #0

## 2023-10-03 MED ORDER — ZOLPIDEM TARTRATE 10 MG PO TABS
10.0000 mg | ORAL_TABLET | Freq: Every evening | ORAL | 3 refills | Status: DC | PRN
  Filled 2023-10-03 – 2023-10-18 (×2): qty 30, 30d supply, fill #0
  Filled 2023-11-18: qty 30, 30d supply, fill #1

## 2023-10-03 MED ORDER — AMPHETAMINE-DEXTROAMPHETAMINE 20 MG PO TABS
20.0000 mg | ORAL_TABLET | Freq: Two times a day (BID) | ORAL | 0 refills | Status: DC
  Filled 2023-10-18: qty 60, 30d supply, fill #0

## 2023-10-03 MED ORDER — AMPHETAMINE-DEXTROAMPHETAMINE 20 MG PO TABS
20.0000 mg | ORAL_TABLET | Freq: Two times a day (BID) | ORAL | 0 refills | Status: DC
  Filled 2023-12-17: qty 60, 30d supply, fill #0
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
## 2023-10-03 NOTE — Progress Notes (Signed)
 Scheduled
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
## 2023-10-03 NOTE — Assessment & Plan Note (Signed)
 Chronic, ongoing since middle school.  Will continue Adderall 20 MG BID as has been on for years with benefit.  Educated him on office rules for controlled substances.  He has signed controlled substance agreement, UDS due next 04/29/24 (last done 04/30/23 at pain clinic). Agrees to every 3 month visits.  Refills sent in today -- 3 refills predated.

## 2023-10-08 ENCOUNTER — Other Ambulatory Visit: Payer: Self-pay

## 2023-10-08 MED ORDER — METHOTREXATE SODIUM 2.5 MG PO TABS
12.5000 mg | ORAL_TABLET | ORAL | 0 refills | Status: AC
  Filled 2023-10-08: qty 30, 42d supply, fill #0

## 2023-10-08 MED ORDER — FOLIC ACID 1 MG PO TABS
1.0000 mg | ORAL_TABLET | Freq: Every day | ORAL | 0 refills | Status: AC
  Filled 2023-10-08: qty 90, 90d supply, fill #0

## 2023-10-17 ENCOUNTER — Ambulatory Visit: Admitting: Urology

## 2023-10-18 ENCOUNTER — Other Ambulatory Visit: Payer: Self-pay

## 2023-10-21 ENCOUNTER — Ambulatory Visit: Attending: Nurse Practitioner | Admitting: Nurse Practitioner

## 2023-10-21 ENCOUNTER — Other Ambulatory Visit: Payer: Self-pay

## 2023-10-21 ENCOUNTER — Encounter: Payer: Self-pay | Admitting: Nurse Practitioner

## 2023-10-21 VITALS — BP 127/93 | HR 78 | Temp 97.3°F | Resp 16 | Ht 70.0 in | Wt 230.0 lb

## 2023-10-21 DIAGNOSIS — G5782 Other specified mononeuropathies of left lower limb: Secondary | ICD-10-CM | POA: Insufficient documentation

## 2023-10-21 DIAGNOSIS — G894 Chronic pain syndrome: Secondary | ICD-10-CM | POA: Insufficient documentation

## 2023-10-21 DIAGNOSIS — Z79899 Other long term (current) drug therapy: Secondary | ICD-10-CM | POA: Diagnosis not present

## 2023-10-21 DIAGNOSIS — R1032 Left lower quadrant pain: Secondary | ICD-10-CM | POA: Insufficient documentation

## 2023-10-21 DIAGNOSIS — G5792 Unspecified mononeuropathy of left lower limb: Secondary | ICD-10-CM | POA: Insufficient documentation

## 2023-10-21 MED ORDER — OXYCODONE-ACETAMINOPHEN 10-325 MG PO TABS
1.0000 | ORAL_TABLET | Freq: Two times a day (BID) | ORAL | 0 refills | Status: AC | PRN
  Filled 2023-11-18: qty 60, 30d supply, fill #0

## 2023-10-21 MED ORDER — OXYCODONE-ACETAMINOPHEN 10-325 MG PO TABS
1.0000 | ORAL_TABLET | Freq: Two times a day (BID) | ORAL | 0 refills | Status: DC | PRN
  Filled 2023-12-17: qty 60, 30d supply, fill #0

## 2023-10-21 MED ORDER — OXYCODONE-ACETAMINOPHEN 10-325 MG PO TABS: 1.0000 | ORAL_TABLET | Freq: Two times a day (BID) | ORAL | 0 refills | Status: DC | PRN

## 2023-10-21 NOTE — Progress Notes (Signed)
 Nursing Pain Medication Assessment:  Safety precautions to be maintained throughout the outpatient stay will include: orient to surroundings, keep bed in low position, maintain call bell within reach at all times, provide assistance with transfer out of bed and ambulation.  Medication Inspection Compliance: Pill count conducted under aseptic conditions, in front of the patient. Neither the pills nor the bottle was removed from the patient's sight at any time. Once count was completed pills were immediately returned to the patient in their original bottle.  Medication: Oxycodone /APAP Pill/Patch Count: 57 of 60 pills/patches remain Pill/Patch Appearance: Markings consistent with prescribed medication Bottle Appearance: Standard pharmacy container. Clearly labeled. Filled Date: 60 / 03 / 2025 Last Medication intake:  YesterdaySafety precautions to be maintained throughout the outpatient stay will include: orient to surroundings, keep bed in low position, maintain call bell within reach at all times, provide assistance with transfer out of bed and ambulation.

## 2023-10-21 NOTE — Progress Notes (Signed)
 PROVIDER NOTE: Interpretation of information contained herein should be left to medically-trained personnel. Specific patient instructions are provided elsewhere under Patient Instructions section of medical record. This document was created in part using AI and STT-dictation technology, any transcriptional errors that may result from this process are unintentional.  Patient: Darren Miller  Service: E/M   PCP: Valerio Melanie DASEN, NP  DOB: Jun 23, 1985  DOS: 10/21/2023  Provider: Emmy MARLA Blanch, NP  MRN: [id number]  Delivery: Face-to-face  Specialty: Interventional Pain Management  Type: Established Patient  Setting: Ambulatory outpatient facility  Specialty designation: 09  Referring Prov.: Valerio Melanie DASEN, NP  Location: Outpatient office facility       History of present illness (HPI) [name], a 38 y.o. year old male, is here today because of his Abdominal pain (Left Lower quadrant) and Ilioinguinal pain on left side . Mr. Hayworth's primary complain today is Abdominal Pain (Left, lower)  Pertinent problems: Mr. Dewoody has  Posttraumatic stress disorder (PTSD); Ilioinguinal neuralgia of the left side; Abdominal pain, left lower quadrant; S/P repair of ventral hernia, and Chronic pain syndrome on their pertinent problem list.  Pain Assessment: Severity of Chronic pain is reported as a 6 /10. Location: Abdomen Left, Lower/ . Onset: More than a month ago. Quality: Burning, Sharp. Timing: Constant. Modifying factor(s): medications. Vitals:  height is 5' 10 (1.778 m) and weight is 230 lb (104.3 kg). His temporal temperature is 97.3 F (36.3 C) (abnormal). His blood pressure is 127/93 (abnormal) and his pulse is 78. His respiration is 16 and oxygen saturation is 100%.  BMI: Estimated body mass index is 33 kg/m as calculated from the following:   Height as of this encounter: 5' 10 (1.778 m).   Weight as of this encounter: 230 lb (104.3 kg).  Last encounter: 07/23/2023. Last procedure:  07/01/2023  Reason for encounter: evaluation for possible interventional PM therapy/treatment and medication management. No change in medical history since last visit.  Patient's pain is at baseline.  Patient continues multimodal pain regimen as prescribed.  States that it provides pain relief and improvement in functional status.  The patient continues to experience left-sided Ilioinguinal pain.  He has a history of abdominal hernia and bowel issues and is scheduled for hernia repair surgery in December 2025.  He also reports persistent left-sided abdominal pain.  Of note, he underwent a Ilioinguinal nerve block on July 01, 2023, with approximately 50% pain relief.  As the pain remains persistent and constant, the patient expresses interest in repeating the procedure. Pharmacotherapy Assessment   Oxycodone -acetaminophen  (Percocet) 10-3 25 mg every 12 hours as needed for pain. MME=30  Monitoring: [place] PMP: PDMP reviewed during this encounter.       Pharmacotherapy: No side-effects or adverse reactions reported. Compliance: No problems identified. Effectiveness: Clinically acceptable.  Shela Reda CROME, RN  10/21/2023  8:12 AM  Sign when Signing Visit Nursing Pain Medication Assessment:  Safety precautions to be maintained throughout the outpatient stay will include: orient to surroundings, keep bed in low position, maintain call bell within reach at all times, provide assistance with transfer out of bed and ambulation.  Medication Inspection Compliance: Pill count conducted under aseptic conditions, in front of the patient. Neither the pills nor the bottle was removed from the patient's sight at any time. Once count was completed pills were immediately returned to the patient in their original bottle.  Medication: Oxycodone /APAP Pill/Patch Count: 57 of 60 pills/patches remain Pill/Patch Appearance: Markings consistent with prescribed medication Bottle  Appearance: Standard pharmacy container. Clearly  labeled. Filled Date: 74 / 03 / 2025 Last Medication intake:  YesterdaySafety precautions to be maintained throughout the outpatient stay will include: orient to surroundings, keep bed in low position, maintain call bell within reach at all times, provide assistance with transfer out of bed and ambulation.     UDS:  Summary  Date Value Ref Range Status  04/30/2023 FINAL  Final    Comment:    ==================================================================== [employer] (MW) ==================================================================== Test                             Result       Flag       Units  Drug Present and Declared for Prescription Verification   Amphetamine                     227          EXPECTED   ng/mg creat    Amphetamine  is available as a schedule II prescription drug.    Oxycodone                       437          EXPECTED   ng/mg creat   Oxymorphone                    467          EXPECTED   ng/mg creat   Noroxycodone                   493          EXPECTED   ng/mg creat   Noroxymorphone                 169          EXPECTED   ng/mg creat    Sources of oxycodone  are scheduled prescription medications.    Oxymorphone, noroxycodone, and noroxymorphone are expected    metabolites of oxycodone . Oxymorphone is also available as a    scheduled prescription medication.  Drug Present not Declared for Prescription Verification   Carboxy-THC                    11           UNEXPECTED ng/mg creat    Carboxy-THC is a metabolite of tetrahydrocannabinol (THC). Source of    THC is most commonly herbal marijuana or marijuana-based products,    but THC is also present in a scheduled prescription medication.    Trace amounts of THC can be present in hemp and cannabidiol (CBD)    products. This test is not intended to distinguish between delta-9-    tetrahydrocannabinol, the predominant form of THC in most herbal or    marijuana-based products, and  delta-8-tetrahydrocannabinol.  ==================================================================== Test                      Result    Flag   Units      Ref Range   Creatinine              259              mg/dL      >=79 ==================================================================== Declared Medications:  The flagging and interpretation on this report are based on the  following declared medications.  Unexpected results may arise  from  inaccuracies in the declared medications.   **Note: The testing scope of this panel includes these medications:   Amphetamine  (Adderall)  Oxycodone  (Percocet)   **Note: The testing scope of this panel does not include the  following reported medications:   Acetaminophen  (Percocet)  Acetaminophen  (Tylenol )  Albuterol  (Ventolin  HFA)  Atropine  (Lomotil )  Bacitracin   Bacitracin  (Polysporin )  Baclofen  (Lioresal )  Bisacodyl  (Dulcolax)  Bupropion  (Wellbutrin )  Celecoxib  (Celebrex )  Clobetasol  (Temovate )  Crisaborole  (Eucrisa )  Diphenoxylate  (Lomotil )  Famotidine  (Pepcid )  Methocarbamol  (Robaxin )  Neomycin   Pantoprazole  (Protonix )  Polymyxin B  (Polymyxin)  Polymyxin B  (Polysporin )  Prazosin  (Minipress )  Pregabalin  (Lyrica )  Ropinirole  (Requip )  Simethicone  (Mylicon)  Sumatriptan  (Imitrex )  Tadalafil  (Cialis )  Trazodone  (Desyrel )  Triamcinolone  (Kenalog )  Zolpidem  (Ambien ) ==================================================================== For clinical consultation, please call [phone]. ====================================================================     No results found for: CBDTHCR No results found for: D8THCCBX No results found for: D9THCCBX  ROS  Constitutional: Denies any fever or chills Gastrointestinal: No reported hemesis, hematochezia, vomiting, or acute GI distress Musculoskeletal:   left lower quadrant pain, hip pain radiate to groin on left side  Neurological: No reported episodes of acute onset  apraxia, aphasia, dysarthria, agnosia, amnesia, paralysis, loss of coordination, or loss of consciousness  Medication Review  Crisaborole , Medi-First Triple Antibiotic, SUMAtriptan , amphetamine -dextroamphetamine , buPROPion , clobetasol  ointment, clomiPHENE , famotidine , folic acid , methotrexate , oxyCODONE -acetaminophen , pantoprazole , pregabalin , rOPINIRole , simethicone , tadalafil , triamcinolone  cream, and zolpidem   History Review  Allergy: Mr. Mcfarren is allergic to morphine , silicone, and tape. Drug: Mr. Schlabach  reports current drug use. Drug: Marijuana. Alcohol:  reports that he does not currently use alcohol after a past usage of about 14.0 standard drinks of alcohol per week. Tobacco:  reports that he has quit smoking. His smoking use included cigarettes. He has never used smokeless tobacco. Social: Mr. Krol  reports that he has quit smoking. His smoking use included cigarettes. He has never used smokeless tobacco. He reports that he does not currently use alcohol after a past usage of about 14.0 standard drinks of alcohol per week. He reports current drug use. Drug: Marijuana. Medical:  has a past medical history of ADHD (attention deficit hyperactivity disorder), Allergy to alpha-gal, Anxiety, Depression, and GERD (gastroesophageal reflux disease). Surgical: Mr. Hevener  has a past surgical history that includes Appendectomy; Colonoscopy with propofol  (N/A, 11/19/2017); Esophagogastroduodenoscopy (egd) with propofol  (N/A, 11/19/2017); and Knee arthroscopy with meniscal repair (Right, 07/16/2019). Family: family history includes ADD / ADHD in his paternal aunt and paternal uncle; Alcohol abuse in his father, paternal grandfather, paternal grandmother, and paternal uncle; Thyroid  disease in his mother.  Laboratory Chemistry Profile   Renal Lab Results  Component Value Date   BUN 22 07/03/2023   CREATININE 1.07 07/03/2023   LABCREA 11.1 (L) 10/30/2022   BCR 21 (H) 07/03/2023   GFRAA >60  07/20/2019   GFRNONAA >60 02/04/2021    Hepatic Lab Results  Component Value Date   AST 28 07/03/2023   ALT 29 07/03/2023   ALBUMIN 4.6 07/03/2023   ALKPHOS 117 07/03/2023    Electrolytes Lab Results  Component Value Date   NA 140 07/03/2023   K 4.3 07/03/2023   CL 101 07/03/2023   CALCIUM 9.8 07/03/2023   MG 2.1 02/04/2021    Bone Lab Results  Component Value Date   TESTOFREE 7.4 08/02/2023   TESTOSTERONE  166 (L) 08/02/2023    Inflammation (CRP: Acute Phase) (ESR: Chronic Phase) Lab Results  Component Value Date   LATICACIDVEN 1.3 02/02/2021  Note: Above Lab results reviewed.  Recent Imaging Review  DG Hand Complete Left CLINICAL DATA:  Trauma  EXAM: LEFT HAND - COMPLETE 3+ VIEW  COMPARISON:  None Available.  FINDINGS: Small acute avulsion fracture is seen along the ventral base of the fifth middle phalanx. There is no dislocation. Soft tissues are within normal limits.  IMPRESSION: Small acute avulsion fracture along the ventral base of the fifth middle phalanx.  Electronically Signed   By: Greig Pique M.D.   On: 08/29/2023 17:23 Note: Reviewed        Physical Exam  Vitals: BP (!) 127/93 (Cuff Size: Large)   Pulse 78   Temp (!) 97.3 F (36.3 C) (Temporal)   Resp 16   Ht 5' 10 (1.778 m)   Wt 230 lb (104.3 kg)   SpO2 100%   BMI 33.00 kg/m  BMI: Estimated body mass index is 33 kg/m as calculated from the following:   Height as of this encounter: 5' 10 (1.778 m).   Weight as of this encounter: 230 lb (104.3 kg). Ideal: Ideal body weight: 73 kg (160 lb 15 oz) Adjusted ideal body weight: 85.5 kg (188 lb 9 oz) General appearance: Well nourished, well developed, and well hydrated. In no apparent acute distress Mental status: Alert, oriented x 3 (person, place, & time)       Respiratory: No evidence of acute respiratory distress Eyes: PERLA  Musculoskeletal: + Ilioinguinal pain (left side), Abdominal pain (left lower side) Assessment    Diagnosis Status  1. Ilioinguinal neuralgia of left side   2. Genitofemoral neuralgia of left side   3. Chronic pain syndrome   4. Medication management   5. Abdominal pain, left lower quadrant    Having a Flare-up Having a Flare-up Controlled   Updated Problems: No problems updated.  Plan of Care  Problem-specific:  Assessment and Plan  Chronic pain syndrome: Patient's pain is well-controlled with oxycodone -acetaminophen  10-325 mg, will continue on current medication regimen.  Prescribing drug monitoring (PDMP) reviewed; findings consistent with the use of prescribed medication and no evidence of narcotic misuse or abuse.  Advised patient to drink plenty of water to reduce opioid related constipation.  Schedule follow-up in 90 days for medication management.  Chronic abdominal pain post hernia repair / ilioinguinal neuralgia of the left side:   The patient has a history of abdominal hernia and bowel issues.  He continues experiencing left lower quadrant abdominal pain.  The patient has a chronic neuropathic pain likely related to nerve damage involving the ilioinguinal and iliohypogastric nerves.  We discussed repeating the nerve block, as it previously provided significant pain relief.   Mr. Eland K Stille has a current medication list which includes the following long-term medication(s): amphetamine -dextroamphetamine , [START ON 11/15/2023] amphetamine -dextroamphetamine , [START ON 12/17/2023] amphetamine -dextroamphetamine , famotidine , pantoprazole , pregabalin , ropinirole , simethicone , sumatriptan , tadalafil , zolpidem , and bupropion .  Pharmacotherapy (Medications Ordered): Meds ordered this encounter  Medications   oxyCODONE -acetaminophen  (PERCOCET) 10-325 MG tablet    Sig: Take 1 tablet by mouth every 12 (twelve) hours as needed for pain. Must last 30 days.    Dispense:  60 tablet    Refill:  0    Chronic Pain: STOP Act (Not applicable) Fill 1 day early if closed on refill  date. Avoid benzodiazepines within 8 hours of opioids   oxyCODONE -acetaminophen  (PERCOCET) 10-325 MG tablet    Sig: Take 1 tablet by mouth every 12 (twelve) hours as needed for pain. Must last 30 days.    Dispense:  60 tablet  Refill:  0    Chronic Pain: STOP Act (Not applicable) Fill 1 day early if closed on refill date. Avoid benzodiazepines within 8 hours of opioids   oxyCODONE -acetaminophen  (PERCOCET) 10-325 MG tablet    Sig: Take 1 tablet by mouth every 12 (twelve) hours as needed for pain. Must last 30 days.    Dispense:  60 tablet    Refill:  0    Chronic Pain: STOP Act (Not applicable) Fill 1 day early if closed on refill date. Avoid benzodiazepines within 8 hours of opioids   Orders:  Orders Placed This Encounter  Procedures   ILIONINGUINAL NERVE BLOCK    Standing Status:   Future    Expiration Date:   01/21/2024    Scheduling Instructions:     Side: Left-sided     Sedation: With Sedation.     Timeframe: ASAA    Where will this procedure be performed?:   [facility]      Return in about 3 weeks (around 11/11/2023) for (Clinic):(L)  left Ilioinguinal NB # 4 with Dr. Marcelino .    Recent Visits Date Type Provider Dept  07/23/23 Office Visit Mirelle Biskup K, NP [facility]  Showing recent visits within past 90 days and meeting all other requirements Today's Visits Date Type Provider Dept  10/21/23 Office Visit Montgomery Rothlisberger K, NP [facility]  Showing today's visits and meeting all other requirements Future Appointments Date Type Provider Dept  11/13/23 Appointment Marcelino Nurse, MD [facility]  01/06/24 Appointment Alden Feagan K, NP [facility]  Showing future appointments within next 90 days and meeting all other requirements  I discussed the assessment and treatment plan with the patient. The patient was provided an opportunity to ask questions and all were answered. The patient agreed with the plan and demonstrated an  understanding of the instructions.  Patient advised to call back or seek an in-person evaluation if the symptoms or condition worsens.  I personally spent a total of 30 minutes in the care of the patient today including preparing to see the patient, getting/reviewing separately obtained history, performing a medically appropriate exam/evaluation, counseling and educating, placing orders, referring and communicating with other health care professionals, documenting clinical information in the EHR, independently interpreting results, communicating results, and coordinating care.  Note by: Emmy MARLA Blanch, NP  Date: 10/21/2023; Time: 9:51 AM

## 2023-10-21 NOTE — Patient Instructions (Signed)
 ______________________________________________________________________    Preparing for your procedure  Appointments: If you think you may not be able to keep your appointment, call 24-48 hours in advance to cancel. We need time to make it available to others.  Procedure visits are for procedures only. During your procedure appointment there will be: NO Prescription Refills*. NO medication changes or discussions*. NO discussion of disability issues*. NO unrelated pain problem evaluations*. NO evaluations to order other pain procedures*. *These will be addressed at a separate and distinct evaluation encounter on the provider's evaluation schedule and not during procedure days.  Instructions: Food intake: Avoid eating anything solid for at least 8 hours prior to your procedure. Clear liquid intake: You may take clear liquids such as water up to 2 hours prior to your procedure. (No carbonated drinks. No soda.) Transportation: Unless otherwise stated by your physician, bring a driver. (Driver cannot be a Market researcher, Pharmacist, community, or any other form of public transportation.) Morning Medicines: Except for blood thinners, take all of your other morning medications with a sip of water. Make sure to take your heart and blood pressure medicines. If your blood pressure's lower number is above 100, the case will be rescheduled. Blood thinners: Make sure to stop your blood thinners as instructed.  If you take a blood thinner, but were not instructed to stop it, call our office [phone] and ask to talk to a nurse. Not stopping a blood thinner prior to certain procedures could lead to serious complications. Diabetics on insulin: Notify the staff so that you can be scheduled 1st case in the morning. If your diabetes requires high dose insulin, take only  of your normal insulin dose the morning of the procedure and notify the staff that you have done so. Preventing infections: Shower with an antibacterial soap the  morning of your procedure.  Build-up your immune system: Take 1000 mg of Vitamin C with every meal (3 times a day) the day prior to your procedure. Antibiotics: Inform the nursing staff if you are taking any antibiotics or if you have any conditions that may require antibiotics prior to procedures. (Example: recent joint implants)   Pregnancy: If you are pregnant make sure to notify the nursing staff. Not doing so may result in injury to the fetus, including death.  Sickness: If you have a cold, fever, or any active infections, call and cancel or reschedule your procedure. Receiving steroids while having an infection may result in complications. Arrival: You must be in the facility at least 30 minutes prior to your scheduled procedure. Tardiness: Your scheduled time is also the cutoff time. If you do not arrive at least 15 minutes prior to your procedure, you will be rescheduled.  Children: Do not bring any children with you. Make arrangements to keep them home. Dress appropriately: There is always a possibility that your clothing may get soiled. Avoid long dresses. Valuables: Do not bring any jewelry or valuables.  Reasons to call and reschedule or cancel your procedure: (Following these recommendations will minimize the risk of a serious complication.) Surgeries: Avoid having procedures within 2 weeks of any surgery. (Avoid for 2 weeks before or after any surgery). Flu Shots: Avoid having procedures within 2 weeks of a flu shots or . (Avoid for 2 weeks before or after immunizations). Barium: Avoid having a procedure within 7-10 days after having had a radiological study involving the use of radiological contrast. (Myelograms, Barium swallow or enema study). Heart attacks: Avoid any elective procedures or surgeries for the  initial 6 months after a "Myocardial Infarction" (Heart Attack). Blood thinners: It is imperative that you stop these medications before procedures. Let us know if you if you take  any blood thinner.  Infection: Avoid procedures during or within two weeks of an infection (including chest colds or gastrointestinal problems). Symptoms associated with infections include: Localized redness, fever, chills, night sweats or profuse sweating, burning sensation when voiding, cough, congestion, stuffiness, runny nose, sore throat, diarrhea, nausea, vomiting, cold or Flu symptoms, recent or current infections. It is specially important if the infection is over the area that we intend to treat. Heart and lung problems: Symptoms that may suggest an active cardiopulmonary problem include: cough, chest pain, breathing difficulties or shortness of breath, dizziness, ankle swelling, uncontrolled high or unusually low blood pressure, and/or palpitations. If you are experiencing any of these symptoms, cancel your procedure and contact your primary care physician for an evaluation.  Remember:  Regular Business hours are:  Monday to Thursday 8:00 AM to 4:00 PM  Provider's Schedule: Delano Metz, MD:  Procedure days: Tuesday and Thursday 7:30 AM to 4:00 PM  Edward Jolly, MD:  Procedure days: Monday and Wednesday 7:30 AM to 4:00 PM Last  Updated: 12/25/2022 ______________________________________________________________________      ______________________________________________________________________    General Risks and Possible Complications  Patient Responsibilities: It is important that you read this as it is part of your informed consent. It is our duty to inform you of the risks and possible complications associated with treatments offered to you. It is your responsibility as a patient to read this and to ask questions about anything that is not clear or that you believe was not covered in this document.  Patient's Rights: You have the right to refuse treatment. You also have the right to change your mind, even after initially having agreed to have the treatment done. However,  under this last option, if you wait until the last second to change your mind, you may be charged for the materials used up to that point.  Introduction: Medicine is not an Visual merchandiser. Everything in Medicine, including the lack of treatment(s), carries the potential for danger, harm, or loss (which is by definition: Risk). In Medicine, a complication is a secondary problem, condition, or disease that can aggravate an already existing one. All treatments carry the risk of possible complications. The fact that a side effects or complications occurs, does not imply that the treatment was conducted incorrectly. It must be clearly understood that these can happen even when everything is done following the highest safety standards.  No treatment: You can choose not to proceed with the proposed treatment alternative. The "PRO(s)" would include: avoiding the risk of complications associated with the therapy. The "CON(s)" would include: not getting any of the treatment benefits. These benefits fall under one of three categories: diagnostic; therapeutic; and/or palliative. Diagnostic benefits include: getting information which can ultimately lead to improvement of the disease or symptom(s). Therapeutic benefits are those associated with the successful treatment of the disease. Finally, palliative benefits are those related to the decrease of the primary symptoms, without necessarily curing the condition (example: decreasing the pain from a flare-up of a chronic condition, such as incurable terminal cancer).  General Risks and Complications: These are associated to most interventional treatments. They can occur alone, or in combination. They fall under one of the following six (6) categories: no benefit or worsening of symptoms; bleeding; infection; nerve damage; allergic reactions; and/or death. No benefits or worsening  of symptoms: In Medicine there are no guarantees, only probabilities. No healthcare provider can  ever guarantee that a medical treatment will work, they can only state the probability that it may. Furthermore, there is always the possibility that the condition may worsen, either directly, or indirectly, as a consequence of the treatment. Bleeding: This is more common if the patient is taking a blood thinner, either prescription or over the counter (example: Goody Powders, Fish oil, Aspirin, Garlic, etc.), or if suffering a condition associated with impaired coagulation (example: Hemophilia, cirrhosis of the liver, low platelet counts, etc.). However, even if you do not have one on these, it can still happen. If you have any of these conditions, or take one of these drugs, make sure to notify your treating physician. Infection: This is more common in patients with a compromised immune system, either due to disease (example: diabetes, cancer, human immunodeficiency virus [HIV], etc.), or due to medications or treatments (example: therapies used to treat cancer and rheumatological diseases). However, even if you do not have one on these, it can still happen. If you have any of these conditions, or take one of these drugs, make sure to notify your treating physician. Nerve Damage: This is more common when the treatment is an invasive one, but it can also happen with the use of medications, such as those used in the treatment of cancer. The damage can occur to small secondary nerves, or to large primary ones, such as those in the spinal cord and brain. This damage may be temporary or permanent and it may lead to impairments that can range from temporary numbness to permanent paralysis and/or brain death. Allergic Reactions: Any time a substance or material comes in contact with our body, there is the possibility of an allergic reaction. These can range from a mild skin rash (contact dermatitis) to a severe systemic reaction (anaphylactic reaction), which can result in death. Death: In general, any medical  intervention can result in death, most of the time due to an unforeseen complication. ______________________________________________________________________

## 2023-10-22 ENCOUNTER — Other Ambulatory Visit: Payer: Self-pay

## 2023-10-22 DIAGNOSIS — F411 Generalized anxiety disorder: Secondary | ICD-10-CM | POA: Diagnosis not present

## 2023-10-22 DIAGNOSIS — K4090 Unilateral inguinal hernia, without obstruction or gangrene, not specified as recurrent: Secondary | ICD-10-CM | POA: Diagnosis not present

## 2023-10-22 DIAGNOSIS — F331 Major depressive disorder, recurrent, moderate: Secondary | ICD-10-CM | POA: Diagnosis not present

## 2023-10-22 DIAGNOSIS — F4310 Post-traumatic stress disorder, unspecified: Secondary | ICD-10-CM | POA: Diagnosis not present

## 2023-10-22 MED ORDER — PRAZOSIN HCL 1 MG PO CAPS
3.0000 mg | ORAL_CAPSULE | Freq: Every day | ORAL | 0 refills | Status: DC
  Filled 2023-10-22: qty 270, 90d supply, fill #0

## 2023-10-22 MED ORDER — AMITRIPTYLINE HCL 10 MG PO TABS
10.0000 mg | ORAL_TABLET | Freq: Every day | ORAL | 0 refills | Status: DC
  Filled 2023-10-22: qty 90, 90d supply, fill #0

## 2023-10-28 DIAGNOSIS — Z87891 Personal history of nicotine dependence: Secondary | ICD-10-CM | POA: Diagnosis not present

## 2023-10-28 DIAGNOSIS — Z9049 Acquired absence of other specified parts of digestive tract: Secondary | ICD-10-CM | POA: Diagnosis not present

## 2023-10-28 DIAGNOSIS — Z79899 Other long term (current) drug therapy: Secondary | ICD-10-CM | POA: Diagnosis not present

## 2023-10-28 DIAGNOSIS — Z6833 Body mass index (BMI) 33.0-33.9, adult: Secondary | ICD-10-CM | POA: Diagnosis not present

## 2023-10-28 DIAGNOSIS — G4730 Sleep apnea, unspecified: Secondary | ICD-10-CM | POA: Diagnosis not present

## 2023-10-28 DIAGNOSIS — K4090 Unilateral inguinal hernia, without obstruction or gangrene, not specified as recurrent: Secondary | ICD-10-CM | POA: Diagnosis not present

## 2023-10-28 DIAGNOSIS — E66811 Obesity, class 1: Secondary | ICD-10-CM | POA: Diagnosis not present

## 2023-10-28 DIAGNOSIS — K432 Incisional hernia without obstruction or gangrene: Secondary | ICD-10-CM | POA: Diagnosis not present

## 2023-10-28 DIAGNOSIS — K429 Umbilical hernia without obstruction or gangrene: Secondary | ICD-10-CM | POA: Diagnosis not present

## 2023-10-28 DIAGNOSIS — K219 Gastro-esophageal reflux disease without esophagitis: Secondary | ICD-10-CM | POA: Diagnosis not present

## 2023-11-06 DIAGNOSIS — F4310 Post-traumatic stress disorder, unspecified: Secondary | ICD-10-CM | POA: Diagnosis not present

## 2023-11-07 ENCOUNTER — Other Ambulatory Visit
Admission: RE | Admit: 2023-11-07 | Discharge: 2023-11-07 | Disposition: A | Discharge status: Home / Self Care | Attending: Urology | Admitting: Urology

## 2023-11-07 ENCOUNTER — Ambulatory Visit (INDEPENDENT_AMBULATORY_CARE_PROVIDER_SITE_OTHER): Admitting: Urology

## 2023-11-07 VITALS — BP 116/83 | HR 109 | Wt 225.0 lb

## 2023-11-07 DIAGNOSIS — E291 Testicular hypofunction: Secondary | ICD-10-CM | POA: Insufficient documentation

## 2023-11-07 DIAGNOSIS — N529 Male erectile dysfunction, unspecified: Secondary | ICD-10-CM
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
## 2023-11-08 LAB — TESTOSTERONE: Testosterone: 166 ng/dL — ABNORMAL LOW (ref 264–916)

## 2023-11-12 ENCOUNTER — Ambulatory Visit: Payer: Self-pay | Admitting: Urology

## 2023-11-13 ENCOUNTER — Ambulatory Visit
Attending: Student in an Organized Health Care Education/Training Program | Admitting: Student in an Organized Health Care Education/Training Program

## 2023-11-13 ENCOUNTER — Encounter: Payer: Self-pay | Admitting: Student in an Organized Health Care Education/Training Program

## 2023-11-13 VITALS — BP 108/58 | HR 77 | Temp 97.5°F | Resp 12 | Ht 70.0 in | Wt 225.0 lb

## 2023-11-13 DIAGNOSIS — G5782 Other specified mononeuropathies of left lower limb: Secondary | ICD-10-CM | POA: Diagnosis not present

## 2023-11-13 DIAGNOSIS — G5792 Unspecified mononeuropathy of left lower limb: Secondary | ICD-10-CM | POA: Insufficient documentation

## 2023-11-13 DIAGNOSIS — G894 Chronic pain syndrome: Secondary | ICD-10-CM | POA: Diagnosis not present

## 2023-11-13 MED ORDER — DIAZEPAM 5 MG PO TABS
ORAL_TABLET | ORAL | Status: AC
  Filled 2023-11-13: qty 2

## 2023-11-13 MED ORDER — DEXAMETHASONE SOD PHOSPHATE PF 10 MG/ML IJ SOLN: 10.0000 mg | Freq: Once | INTRAMUSCULAR | Status: DC

## 2023-11-13 MED ORDER — LIDOCAINE HCL (PF) 2 % IJ SOLN
INTRAMUSCULAR | Status: AC
  Filled 2023-11-13: qty 10

## 2023-11-13 MED ORDER — ROPIVACAINE HCL 2 MG/ML IJ SOLN
INTRAMUSCULAR | Status: AC
  Filled 2023-11-13: qty 20

## 2023-11-13 MED ORDER — LIDOCAINE HCL 2 % IJ SOLN
20.0000 mL | Freq: Once | INTRAMUSCULAR | Status: DC
  Administered 2023-11-14: 400 mg

## 2023-11-13 MED ORDER — DEXAMETHASONE SOD PHOSPHATE PF 10 MG/ML IJ SOLN
10.0000 mg | Freq: Once | INTRAMUSCULAR | Status: DC
  Administered 2023-11-14: 20 mg

## 2023-11-13 MED ORDER — ROPIVACAINE HCL 2 MG/ML IJ SOLN
9.0000 mL | Freq: Once | INTRAMUSCULAR | Status: DC
  Administered 2023-11-14: 9 mL via PERINEURAL

## 2023-11-13 NOTE — Progress Notes (Signed)
 Safety precautions to be maintained throughout the outpatient stay will include: orient to surroundings, keep bed in low position, maintain call bell within reach at all times, provide assistance with transfer out of bed and ambulation.

## 2023-11-13 NOTE — Patient Instructions (Signed)

## 2023-11-13 NOTE — Progress Notes (Signed)
 PROVIDER NOTE: Interpretation of information contained herein should be left to medically-trained personnel. Specific patient instructions are provided elsewhere under Patient Instructions section of medical record. This document was created in part using STT-dictation technology, any transcriptional errors that may result from this process are unintentional.  Patient: Darren Miller Type: Established DOB: [birth year] MRN: [id number] PCP: Valerio Melanie DASEN, NP  Service: Procedure DOS: 11/13/2023 Setting: Ambulatory Location: Ambulatory outpatient facility Delivery: Face-to-face Provider: Wallie Sherry, MD Specialty: Interventional Pain Management Specialty designation: 09 Location: Outpatient facility Ref. Prov.: Cannady, Jolene T, NP       Interventional Therapy   Primary Reason for Visit: Interventional Pain Management Treatment. CC: Pain (Left inguinal )    Procedure:          Anesthesia, Analgesia, Anxiolysis:  Type: Ilioinguinal Nerve Block #4  Primary Purpose: Diagnostic Region: ASIS, Groin Region Target Area: Lateral one third of the line between the left ASIS and left umbilicus Approach: Anterior approach Laterality: Left  Anesthesia: Local (1-2% Lidocaine )  Anxiolysis: Oral Valium  10 mg Guidance: Ultrasound           Position: Supine   1. Ilioinguinal neuralgia of left side   2. Genitofemoral neuralgia of left side   3. Chronic pain syndrome    NAS-11 Pain score:   Pre-procedure: 7 /10   Post-procedure: 1 /10     H&P (Pre-op Assessment):  Darren Miller is a 38 y.o. (year old), male patient, seen today for interventional treatment. He  has a past surgical history that includes Appendectomy; Colonoscopy with propofol  (N/A, 11/19/2017); Esophagogastroduodenoscopy (egd) with propofol  (N/A, 11/19/2017); Knee arthroscopy with meniscal repair (Right, 07/16/2019); and Hernia repair. Darren Miller has a current medication list which includes the following prescription(s):  amitriptyline, amphetamine -dextroamphetamine , [START ON 11/15/2023] amphetamine -dextroamphetamine , [START ON 12/17/2023] amphetamine -dextroamphetamine , clobetasol  ointment, clobetasol  ointment, clomiphene , eucrisa , famotidine , folic acid , methotrexate , medi-first triple antibiotic, [START ON 11/17/2023] oxycodone -acetaminophen , [START ON 12/17/2023] oxycodone -acetaminophen , [START ON 01/16/2024] oxycodone -acetaminophen , pantoprazole , prazosin , pregabalin , ropinirole , simethicone , sumatriptan , tadalafil , triamcinolone  cream, and zolpidem , and the following Facility-Administered Medications: dexamethasone , dexamethasone , lidocaine , and ropivacaine  (pf) 2 mg/ml (0.2%). His primarily concern today is the Pain (Left inguinal )  Initial Vital Signs:  Pulse/HCG Rate: 77ECG Heart Rate: 71 Temp: (!) 97.5 F (36.4 C) Resp: 16 BP: 115/86 SpO2: 98 %  BMI: Estimated body mass index is 32.28 kg/m as calculated from the following:   Height as of this encounter: 5' 10 (1.778 m).   Weight as of this encounter: 225 lb (102.1 kg).  Risk Assessment: Allergies: Reviewed. He is allergic to morphine , silicone, and tape.  Allergy Precautions: None required Coagulopathies: Reviewed. None identified.  Blood-thinner therapy: None at this time Active Infection(s): Reviewed. None identified. Darren Miller is afebrile  Site Confirmation: Darren Miller was asked to confirm the procedure and laterality before marking the site Procedure checklist: Completed Consent: Before the procedure and under the influence of no sedative(s), amnesic(s), or anxiolytics, the patient was informed of the treatment options, risks and possible complications. To fulfill our ethical and legal obligations, as recommended by the American Medical Association's Code of Ethics, I have informed the patient of my clinical impression; the nature and purpose of the treatment or procedure; the risks, benefits, and possible complications of the intervention; the  alternatives, including doing nothing; the risk(s) and benefit(s) of the alternative treatment(s) or procedure(s); and the risk(s) and benefit(s) of doing nothing. The patient was provided information about the general risks and possible complications associated with the procedure. These may include, but  are not limited to: failure to achieve desired goals, infection, bleeding, organ or nerve damage, allergic reactions, paralysis, and death. In addition, the patient was informed of those risks and complications associated to the procedure, such as failure to decrease pain; infection; bleeding; organ or nerve damage with subsequent damage to sensory, motor, and/or autonomic systems, resulting in permanent pain, numbness, and/or weakness of one or several areas of the body; allergic reactions; (i.e.: anaphylactic reaction); and/or death. Furthermore, the patient was informed of those risks and complications associated with the medications. These include, but are not limited to: allergic reactions (i.e.: anaphylactic or anaphylactoid reaction(s)); adrenal axis suppression; blood sugar elevation that in diabetics may result in ketoacidosis or comma; water retention that in patients with history of congestive heart failure may result in shortness of breath, pulmonary edema, and decompensation with resultant heart failure; weight gain; swelling or edema; medication-induced neural toxicity; particulate matter embolism and blood vessel occlusion with resultant organ, and/or nervous system infarction; and/or aseptic necrosis of one or more joints. Finally, the patient was informed that Medicine is not an exact science; therefore, there is also the possibility of unforeseen or unpredictable risks and/or possible complications that may result in a catastrophic outcome. The patient indicated having understood very clearly. We have given the patient no guarantees and we have made no promises. Enough time was given to the  patient to ask questions, all of which were answered to the patient's satisfaction. Darren Miller has indicated that he wanted to continue with the procedure. Attestation: I, the ordering provider, attest that I have discussed with the patient the benefits, risks, side-effects, alternatives, likelihood of achieving goals, and potential problems during recovery for the procedure that I have provided informed consent. Date  Time: 11/13/2023  9:30 AM  Pre-Procedure Preparation:  Monitoring: As per clinic protocol. Respiration, ETCO2, SpO2, BP, heart rate and rhythm monitor placed and checked for adequate function Safety Precautions: Patient was assessed for positional comfort and pressure points before starting the procedure. Time-out: I initiated and conducted the Time-out before starting the procedure, as per protocol. The patient was asked to participate by confirming the accuracy of the Time Out information. Verification of the correct person, site, and procedure were performed and confirmed by me, the nursing staff, and the patient. Time-out conducted as per Joint Commission's Universal Protocol (UP.01.01.01). Time: 1021 Start Time: 1021 hrs.  Description of Procedure:          Ultrasound Setup: The ultrasound machine was prepared and set to a linear high-frequency transducer with an appropriate frequency for soft tissue imaging. The probe was covered with sterile gel and draped with a sterile cover.  Identification of Target Structures: The ilioinguinal nerve was located using ultrasound guidance. The nerve was identified as it runs along the anterior abdominal wall, medial and slightly inferior to the anterior superior iliac spine  Needle Insertion: A sterile 22-gauge, 50mm needle was introduced under real-time ultrasound guidance. The needle was advanced in a plane parallel to the transducer, ensuring it was directed toward the ilioinguinal nerve. Aspiration was performed to ensure no  vascular structures were punctured.  Injection of Local Anesthetic: Once the needle tip was confirmed to be in close proximity to the ilioinguinal nerve, 12 cc solution made of 10cc of 0.2% ropivacaine ,2 cc of Decadron  10 mg/cc was injected under continuous ultrasound visualization. The spread of the local anesthetic was monitored to ensure adequate coverage of the target nerve.  Confirmation of Block: The spread of the anesthetic around  the ilioinguinal nerve was confirmed by the change in tissue echogenicity. No complications were noted during the injection process.  Vitals:   11/13/23 1022 11/13/23 1026 11/13/23 1032 11/13/23 1036  BP: 101/75 104/70 106/65 (!) 108/58  Pulse:      Resp: 13 10 11 12   Temp:      SpO2: 98% 98% 98% 97%  Weight:      Height:         Start Time: 1021 hrs. End Time: 1034 hrs.      Materials:  Needle(s) Type: Ultrasound PAJUNK needle Gauge: 22G Length: 3.5-in   Imaging Guidance (Non-Spinal):          Type of Imaging Technique: Fluoroscopy Guidance (Non-Spinal) Indication(s): Fluoroscopy guidance for needle placement to enhance accuracy in procedures requiring precise needle localization for targeted delivery of medication in or near specific anatomical locations not easily accessible without such real-time imaging assistance. Exposure Time: Please see nurses notes. Contrast: Before injecting any contrast, we confirmed that the patient did not have an allergy to iodine, shellfish, or radiological contrast. Once satisfactory needle placement was completed at the desired level, radiological contrast was injected. Contrast injected under live fluoroscopy. No contrast complications. See chart for type and volume of contrast used. Fluoroscopic Guidance: I was personally present during the use of fluoroscopy. Tunnel Vision Technique used to obtain the best possible view of the target area. Parallax error corrected before commencing the procedure.  Direction-depth-direction technique used to introduce the needle under continuous pulsed fluoroscopy. Once target was reached, antero-posterior, oblique, and lateral fluoroscopic projection used confirm needle placement in all planes. Images permanently stored in EMR. Interpretation: I personally interpreted the imaging intraoperatively. Adequate needle placement confirmed in multiple planes. Appropriate spread of contrast into desired area was observed. No evidence of afferent or efferent intravascular uptake. Permanent images saved into the patient's record.  Antibiotic Prophylaxis:   Anti-infectives (From admission, onward)    None      Indication(s): None identified  Post-operative Assessment:  Post-procedure Vital Signs:  Pulse/HCG Rate: 7772 Temp: (!) 97.5 F (36.4 C) Resp: 12 BP: (!) 108/58 SpO2: 97 %  EBL: None  Complications: No immediate post-treatment complications observed by team, or reported by patient.  Note: The patient tolerated the entire procedure well. A repeat set of vitals were taken after the procedure and the patient was kept under observation following institutional policy, for this type of procedure. Post-procedural neurological assessment was performed, showing return to baseline, prior to discharge. The patient was provided with post-procedure discharge instructions, including a section on how to identify potential problems. Should any problems arise concerning this procedure, the patient was given instructions to immediately contact us , at any time, without hesitation. In any case, we plan to contact the patient by telephone for a follow-up status report regarding this interventional procedure.  Comments:  No additional relevant information.  Plan of Care (POC)  Orders:  No orders of the defined types were placed in this encounter.   Medications ordered for procedure: Meds ordered this encounter  Medications   lidocaine  (XYLOCAINE ) 2 % (with pres)  injection 400 mg   dexamethasone  (DECADRON ) injection 10 mg   dexamethasone  (DECADRON ) injection 10 mg   ropivacaine  (PF) 2 mg/mL (0.2%) (NAROPIN ) injection 9 mL   Medications administered: Darren Miller had no medications administered during this visit.  See the medical record for exact dosing, route, and time of administration.  Follow-up plan:   Return for Keep sch. appt.  Recent Visits Date Type Provider Dept  10/21/23 Office Visit Patel, Seema K, NP [facility]  Showing recent visits within past 90 days and meeting all other requirements Today's Visits Date Type Provider Dept  11/13/23 Procedure visit Marcelino Nurse, MD [facility]  Showing today's visits and meeting all other requirements Future Appointments Date Type Provider Dept  01/06/24 Appointment Patel, Seema K, NP [facility]  Showing future appointments within next 90 days and meeting all other requirements  Disposition: Discharge home  Discharge (Date  Time): 11/13/2023; 1042 hrs.   Primary Care Physician: Cannady, Jolene T, NP Location: [facility] Note by: Nurse Marcelino, MD (TTS technology used. I apologize for any typographical errors that were not detected and corrected.) Date: 11/13/2023; Time: 11:21 AM  Disclaimer:  Medicine is not an visual merchandiser. The only guarantee in medicine is that nothing is guaranteed. It is important to note that the decision to proceed with this intervention was based on the information collected from the patient. The Data and conclusions were drawn from the patient's questionnaire, the interview, and the physical examination. Because the information was provided in large part by the patient, it cannot be guaranteed that it has not been purposely or unconsciously manipulated. Every effort has been made to obtain as much relevant data as possible for this evaluation. It is important to note that the conclusions that lead  to this procedure are derived in large part from the available data. Always take into account that the treatment will also be dependent on availability of resources and existing treatment guidelines, considered by other Pain Management Practitioners as being common knowledge and practice, at the time of the intervention. For Medico-Legal purposes, it is also important to point out that variation in procedural techniques and pharmacological choices are the acceptable norm. The indications, contraindications, technique, and results of the above procedure should only be interpreted and judged by a Board-Certified Interventional Pain Specialist with extensive familiarity and expertise in the same exact procedure and technique.

## 2023-11-14 ENCOUNTER — Telehealth: Payer: Self-pay

## 2023-11-14 DIAGNOSIS — Z09 Encounter for follow-up examination after completed treatment for conditions other than malignant neoplasm: Secondary | ICD-10-CM | POA: Diagnosis not present

## 2023-11-14 DIAGNOSIS — G5792 Unspecified mononeuropathy of left lower limb: Secondary | ICD-10-CM | POA: Diagnosis not present

## 2023-11-14 DIAGNOSIS — Z8719 Personal history of other diseases of the digestive system: Secondary | ICD-10-CM | POA: Diagnosis not present

## 2023-11-14 DIAGNOSIS — Z9889 Other specified postprocedural states: Secondary | ICD-10-CM | POA: Diagnosis not present

## 2023-11-14 NOTE — Telephone Encounter (Signed)
Post procedure follow up.  Patient states he is doing well 

## 2023-11-18 ENCOUNTER — Other Ambulatory Visit: Payer: Self-pay

## 2023-11-19 NOTE — Progress Notes (Signed)
 11/20/2023 4:40 PM   Darren Miller [birth year] [id number]  Referring provider: Valerio Melanie DASEN, NP [address] [place],  [place] [address]  Urological history: 1.  Hypogonadism - Testosterone  level (10/2023) 166 - Hemoglobin/hematocrit (01/2023) 11.8/36.3 - failed Clomid    2. Undesired fertility - vasectomy (2021)  - never received a post-vas sample   Chief Complaint  Patient presents with   Hypogonadism   HPI: Darren Miller is a 38 y.o. man who presents today for discussion regarding other therapies for hypogonadism.    Previous records reviewed.  He had been on Clomid  previously, but failed to reach therapeutic levels.  He continues to have severe fatigue, low energy and irritability.  He just wants to feel better.  Patient denies any modifying or aggravating factors.  Patient denies any recent UTI's, gross hematuria, dysuria or suprapubic/flank pain.  Patient denies any fevers, chills, nausea or vomiting.    He continues to have erectile dysfunction even with PDE5i's.    PMH: Past Medical History:  Diagnosis Date   ADHD (attention deficit hyperactivity disorder)    Allergy to alpha-gal    Anxiety    Depression    GERD (gastroesophageal reflux disease)     Surgical History: Past Surgical History:  Procedure Laterality Date   APPENDECTOMY     COLONOSCOPY WITH PROPOFOL  N/A 11/19/2017   Procedure: COLONOSCOPY WITH PROPOFOL ;  Surgeon: Therisa Bi, MD;  Location: [facility] ENDOSCOPY;  Service: Gastroenterology;  Laterality: N/A;   ESOPHAGOGASTRODUODENOSCOPY (EGD) WITH PROPOFOL  N/A 11/19/2017   Procedure: ESOPHAGOGASTRODUODENOSCOPY (EGD) WITH PROPOFOL ;  Surgeon: Therisa Bi, MD;  Location: [facility] ENDOSCOPY;  Service: Gastroenterology;  Laterality: N/A;   HERNIA REPAIR     KNEE ARTHROSCOPY WITH MENISCAL REPAIR Right 07/16/2019   Procedure: RIGHT KNEE ARTHROSCOPY WITH PARTIAL MEDIAL MENISCECTOMY;  Surgeon: Marchia Drivers, MD;  Location: [facility] ORS;  Service: Orthopedics;   Laterality: Right;    Home Medications:  Allergies as of 11/20/2023       Reactions   Morphine  Rash   Silicone Nausea And Vomiting   Blisters   Tape Other (See Comments)   Large blisters; must use paper tape for medical purposes        Medication List        Accurate as of November 20, 2023 11:59 PM. If you have any questions, ask your nurse or doctor.          amitriptyline 10 MG tablet Commonly known as: ELAVIL Take 1 tablet (10 mg total) by mouth at bedtime.   amphetamine -dextroamphetamine  20 MG tablet Commonly known as: ADDERALL Take 1 tablet (20 mg total) by mouth 2 (two) times daily.   amphetamine -dextroamphetamine  20 MG tablet Commonly known as: ADDERALL Take 1 tablet (20 mg total) by mouth 2 (two) times daily.   amphetamine -dextroamphetamine  20 MG tablet Commonly known as: ADDERALL Take 1 tablet (20 mg total) by mouth 2 (two) times daily. Start taking on: December 17, 2023   clobetasol  ointment 0.05 % Commonly known as: TEMOVATE  Apply 1 Application topically to affected areas on hands 2 (two) times daily until clear, then as needed.   clobetasol  ointment 0.05 % Commonly known as: TEMOVATE  Apply to affected areas on hands twice daily until clear, then as needed   clomiPHENE  50 MG tablet Commonly known as: CLOMID  Take 0.5 tablets (25 mg total) by mouth daily.   docusate sodium  100 MG capsule Commonly known as: COLACE Take 100 mg by mouth 2 (two) times daily.   Eucrisa  2 %  Oint Generic drug: Crisaborole  Apply to affected area daily (1(one) application(s) topical every day)   famotidine  20 MG tablet Commonly known as: Pepcid  Take 1 tablet (20 mg total) by mouth 2 (two) times daily.   folic acid  1 MG tablet Commonly known as: FOLVITE  Take 1 tablet (1 mg total) by mouth daily 6 days per week, taking one day off for methotrexate  administration   Medi-First Triple Antibiotic [id number] MG-UNIT Oint Apply 1 Application topically 2 (two) times  daily.   methotrexate  2.5 MG tablet Commonly known as: RHEUMATREX Take 5 tablets (12.5 mg total) by mouth once a week with food and a glass of water. Take folic acid  all other days of the week.   oxyCODONE -acetaminophen  10-325 MG tablet Commonly known as: Percocet Take 1 tablet by mouth every 12 (twelve) hours as needed for pain. Must last 30 days.   oxyCODONE -acetaminophen  10-325 MG tablet Commonly known as: Percocet Take 1 tablet by mouth every 12 (twelve) hours as needed for pain. Must last 30 days. Start taking on: December 17, 2023   oxyCODONE -acetaminophen  10-325 MG tablet Commonly known as: Percocet Take 1 tablet by mouth every 12 (twelve) hours as needed for pain. Must last 30 days. Start taking on: January 16, 2024   pantoprazole  40 MG tablet Commonly known as: PROTONIX  Take 1 tablet (40 mg total) by mouth 2 (two) times daily.   prazosin  1 MG capsule Commonly known as: MINIPRESS  Take 3 capsules (3 mg total) by mouth at bedtime.   pregabalin  100 MG capsule Commonly known as: LYRICA  Take 1 capsule (100 mg total) by mouth 3 (three) times daily   rOPINIRole  2 MG tablet Commonly known as: REQUIP  Take 1 tablet (2 mg total) by mouth at bedtime.   simethicone  80 MG chewable tablet Commonly known as: MYLICON Chew 1 tablet (80 mg total) by mouth 4 (four) times daily as needed for flatulence   SUMAtriptan  100 MG tablet Commonly known as: IMITREX  Take 1 tablet (100 mg total) by mouth once for 1 dose as directed for Migraine. May take a second dose after 2 hours if needed.   tadalafil  10 MG tablet Commonly known as: CIALIS  Take 1 tablet (10 mg total) by mouth every other day as needed for erectile dysfunction.   testosterone  cypionate 200 MG/ML injection Commonly known as: DEPOTESTOSTERONE CYPIONATE Inject 1 mL (200 mg total) into the muscle every 14 (fourteen) days. Started by: Kayleann Mccaffery   triamcinolone  cream 0.1 % Commonly known as: KENALOG  Apply topically 2  (two) times daily as needed (for eczema) for up to 30 days   zolpidem  10 MG tablet Commonly known as: AMBIEN  Take 1 tablet (10 mg total) by mouth at bedtime as needed for sleep.        Allergies:  Allergies  Allergen Reactions   Morphine  Rash   Silicone Nausea And Vomiting    Blisters   Tape Other (See Comments)    Large blisters; must use paper tape for medical purposes    Family History: Family History  Problem Relation Age of Onset   Thyroid  disease Mother    Alcohol abuse Father    ADD / ADHD Paternal Aunt    ADD / ADHD Paternal Uncle    Alcohol abuse Paternal Uncle    Alcohol abuse Paternal Grandfather    Alcohol abuse Paternal Grandmother     Social History:  reports that he has quit smoking. His smoking use included cigarettes. He has never used smokeless tobacco. He reports that he  does not currently use alcohol after a past usage of about 14.0 standard drinks of alcohol per week. He reports current drug use. Drug: Marijuana.  ROS: Pertinent ROS in HPI  Physical Exam: BP 134/86   Pulse 90   Ht 5' 10 (1.778 m)   Wt 228 lb (103.4 kg)   BMI 32.71 kg/m   Constitutional:  Well nourished. Alert and oriented, No acute distress. HEENT: [place] AT, moist mucus membranes.  Trachea midline Cardiovascular: No clubbing, cyanosis, or edema. Respiratory: Normal respiratory effort, no increased work of breathing. Neurologic: Grossly intact, no focal deficits, moving all 4 extremities. Psychiatric: Normal mood and affect.  Laboratory Data: See Epic and HPI   I have reviewed the labs.   Pertinent Imaging: N/A  Assessment & Plan:    1. Hypogonadism  - explained that the diagnosis of testosterone  deficiency/hypogonadism requires two morning testosterones at least two days apart below 300 to meet criteria - explained that TRT is not a treatment for ED, he may see some improvement in his erections, but his ED will likely persist even with therapeutic levels of  testosterone  - discussed potential side effects of testosterone  replacement  including stimulation of erythrocytosis; edema; gynecomastia; worsening sleep apnea; venous thromboembolism; testicular atrophy and infertility.   The theoretical risk of growth stimulation of an undetected prostate cancer was also discussed.  He was informed that current evidence does not provide any definitive answers regarding the risks of testosterone  therapy on prostate cancer and cardiovascular disease. The need for periodic monitoring of his testosterone  level, PSA, hematocrit and DRE was discussed.  This monitoring will be conducted every three months during the first year of TRT and then every 6 months if blood work remains stable, if there is an abnormality found in follow up blood work, it will result in the monitoring of blood work more frequently  - Significant symptoms feeling depressed or tired and low energy - Recommend starting TRT - We discussed the most common forms of replacement including intramuscular injection and gels and he desires to start injections - Rx testosterone  cypionate-200 mg every 2 weeks to start - Appointment will be made for injection training once he obtains the medication - Follow-up 5 weeks after starting TRT for testosterone  level and symptom check - discussed that we follow guidelines for age appropriate testosterone  levels to decide on dosing regimens for TRT and this is based on current agreements in the medical community and we will not deviate from this unless there is good scientific data to do so [employer] may not recognize the parameters we use to determine that the patient has low testosterone  and therefore, if they want to pursue TRT, it will have be out-of-pocket.  68-53 years of age  (1-916) (5.3-26.3)   Return for patient will call to schedule appointment for injection training .once his insurance will cover the medication   These notes generated with voice  recognition software. I apologize for typographical errors.  CLOTILDA HELON RIGGERS  [facility] [address]  [address] [place], [place] [address] [phone]

## 2023-11-20 ENCOUNTER — Encounter: Payer: Self-pay | Admitting: Urology

## 2023-11-20 ENCOUNTER — Ambulatory Visit (INDEPENDENT_AMBULATORY_CARE_PROVIDER_SITE_OTHER): Admitting: Urology

## 2023-11-20 ENCOUNTER — Other Ambulatory Visit: Payer: Self-pay

## 2023-11-20 VITALS — BP 134/86 | HR 90 | Ht 70.0 in | Wt 228.0 lb

## 2023-11-20 DIAGNOSIS — E291 Testicular hypofunction: Secondary | ICD-10-CM

## 2023-11-20 MED ORDER — TESTOSTERONE CYPIONATE 200 MG/ML IM SOLN
200.0000 mg | INTRAMUSCULAR | 0 refills | Status: DC
  Filled 2023-11-20: qty 1, 14d supply, fill #0
  Filled 2023-11-20: qty 5, 70d supply, fill #0
  Filled 2023-11-25: qty 6, 84d supply, fill #0
  Filled 2024-01-20: qty 10, 140d supply, fill #1

## 2023-11-21 ENCOUNTER — Telehealth: Payer: Self-pay

## 2023-11-21 NOTE — Telephone Encounter (Signed)
 Stated PA on Cover my meds

## 2023-11-24 ENCOUNTER — Telehealth: Payer: Self-pay | Admitting: Urology

## 2023-11-24 NOTE — Telephone Encounter (Signed)
 Would you call Darren Miller and see if he was able to get his testosterone  medication?  If so, we need to schedule an appointment for testosterone  injection teaching.

## 2023-11-25 ENCOUNTER — Other Ambulatory Visit: Payer: Self-pay

## 2023-11-25 ENCOUNTER — Other Ambulatory Visit (HOSPITAL_COMMUNITY): Payer: Self-pay

## 2023-11-25 DIAGNOSIS — F5101 Primary insomnia: Secondary | ICD-10-CM | POA: Diagnosis not present

## 2023-11-25 DIAGNOSIS — F411 Generalized anxiety disorder: Secondary | ICD-10-CM | POA: Diagnosis not present

## 2023-11-25 DIAGNOSIS — F4310 Post-traumatic stress disorder, unspecified: Secondary | ICD-10-CM | POA: Diagnosis not present

## 2023-11-25 DIAGNOSIS — F331 Major depressive disorder, recurrent, moderate: Secondary | ICD-10-CM | POA: Diagnosis not present

## 2023-11-25 MED ORDER — AMITRIPTYLINE HCL 25 MG PO TABS
25.0000 mg | ORAL_TABLET | Freq: Every day | ORAL | 0 refills | Status: DC
  Filled 2023-11-25: qty 90, 90d supply, fill #0

## 2023-11-25 MED ORDER — DAYVIGO 10 MG PO TABS
10.0000 mg | ORAL_TABLET | Freq: Every evening | ORAL | 1 refills | Status: DC | PRN
  Filled 2023-11-25: qty 30, 30d supply, fill #0

## 2023-11-26 ENCOUNTER — Ambulatory Visit: Admitting: Urology

## 2023-11-26 ENCOUNTER — Other Ambulatory Visit: Payer: Self-pay

## 2023-11-26 DIAGNOSIS — E291 Testicular hypofunction: Secondary | ICD-10-CM

## 2023-11-26 DIAGNOSIS — N529 Male erectile dysfunction, unspecified: Secondary | ICD-10-CM

## 2023-11-26 MED ORDER — TESTOSTERONE CYPIONATE 200 MG/ML IM SOLN
200.0000 mg | INTRAMUSCULAR | Status: AC
  Administered 2023-11-26: 200 mg via INTRAMUSCULAR

## 2023-11-26 NOTE — Progress Notes (Signed)
 Patient presents today for Testosterone  injection teaching. Patient was instructed on how to properly use the 18guage needle to draw up 1cc of the testosterone , into 3cc syringe then changed the needle to the 21guage for injection. Patient then cleaned the vastus lateralis with an alcohol swab and injected the site with bevel up.   Patient dose:1 ml/ 200 mg Lot Number:[id number] Expiration date:02/13/2026 Location: Left thigh  Patient verbalized understanding current dose is 1ml every 14days unless instructed by a provider.  Patient tolerated well.  Patient understood how to dispose of sharps properly and store medication.   Performed by: Mathew Pinal, RN

## 2023-11-26 NOTE — Patient Instructions (Addendum)
 Supplies needed: -Testosterone  from your pharmacy -Alcohol swabs -3cc Luer Lock syringes -18G Luer Lock needles to draw up the medicine -21G Luer Lock needles to inject the medicine -Bandaids or gauze pads -Optional: Transport planner      Instructions for disposing of sharps:  Disposal of syringes and other sharp objects is monitored by the Dietitian (EPA). It is important to dispose of them properly for your safety and for the safety of others.  The EPA promotes all recycling activities, and therefore encourages you to discard medical waste sharps in sturdy, non-recyclable containers, when possible.  Your stat or community environmental programs may have other requirements or suggestions for disposing of your medical waste.  You should contact your local EPA office for any information you may need.  What container should be used Place needles, syringes, lancets and other sharp objects in a hard plastic or metal container with a screw on or tightly secured lid.  Many containers found in the household will do, or you may purchase containers specifically designed for disposal of medical wast sharps.  If a recyclable container is used to dispose of medical waste sharps, make sure that you don't mix the container with other materials to be recycled.  Since the sharps impair a containers recyclability, a container holding your medical waste sharps properly belongs with the regular household trash.  You should label the container "Not for Recycling".  In addition, make sure your sharps container is made of non breakable material and has a lid that can be securely closed (screwed on or tightly secured).  Before discarding a container, be sure to reinforce the lid with heavy-duty tape.  Do not put sharpe objects in a container you plan to recycle or return to a store, and do not use glass or clear plastic containers (see additional information below).  Finally, make sure that you  keep all containers with sharp objects out of the reach of children and pets.  Your home care provider may deliver a sharps container with your medical supplies.  If so place all needles, syringes and lancets in this container and notify the company when the container is approximately 75% full.  Your home care provider will arrange for pickup of the container.  For your safety, do NOT bring your container to the hospital for disposal.        Tips for minimizing injection pain- -inject medicine that is at room temperature -remove all air bubbles from the syringe before injection -wait until the topical alcohol has evaporated before injecting -keep muscles in the injection area relaxed -break through the skin quickly -don't change the direction of the needle as it goes in or comes out -do not reuse disposable needles

## 2023-12-03 ENCOUNTER — Ambulatory Visit: Admitting: Urology

## 2023-12-17 ENCOUNTER — Other Ambulatory Visit: Payer: Self-pay

## 2023-12-18 DIAGNOSIS — K660 Peritoneal adhesions (postprocedural) (postinfection): Secondary | ICD-10-CM | POA: Diagnosis not present

## 2023-12-18 DIAGNOSIS — K432 Incisional hernia without obstruction or gangrene: Secondary | ICD-10-CM | POA: Diagnosis not present

## 2023-12-19 DIAGNOSIS — R935 Abnormal findings on diagnostic imaging of other abdominal regions, including retroperitoneum: Secondary | ICD-10-CM | POA: Diagnosis not present

## 2023-12-19 DIAGNOSIS — R109 Unspecified abdominal pain: Secondary | ICD-10-CM | POA: Diagnosis not present

## 2023-12-19 DIAGNOSIS — R1084 Generalized abdominal pain: Secondary | ICD-10-CM | POA: Diagnosis not present

## 2023-12-19 DIAGNOSIS — G894 Chronic pain syndrome: Secondary | ICD-10-CM | POA: Diagnosis not present

## 2023-12-19 DIAGNOSIS — G8918 Other acute postprocedural pain: Secondary | ICD-10-CM | POA: Diagnosis not present

## 2023-12-19 DIAGNOSIS — K432 Incisional hernia without obstruction or gangrene: Secondary | ICD-10-CM | POA: Diagnosis not present

## 2023-12-19 DIAGNOSIS — F1190 Opioid use, unspecified, uncomplicated: Secondary | ICD-10-CM | POA: Diagnosis not present

## 2023-12-20 DIAGNOSIS — R1084 Generalized abdominal pain: Secondary | ICD-10-CM | POA: Diagnosis not present

## 2023-12-20 DIAGNOSIS — R935 Abnormal findings on diagnostic imaging of other abdominal regions, including retroperitoneum: Secondary | ICD-10-CM | POA: Diagnosis not present

## 2023-12-20 DIAGNOSIS — Z4659 Encounter for fitting and adjustment of other gastrointestinal appliance and device: Secondary | ICD-10-CM | POA: Diagnosis not present

## 2023-12-20 DIAGNOSIS — J9811 Atelectasis: Secondary | ICD-10-CM | POA: Diagnosis not present

## 2023-12-21 DIAGNOSIS — R1084 Generalized abdominal pain: Secondary | ICD-10-CM | POA: Diagnosis not present

## 2023-12-21 DIAGNOSIS — J9811 Atelectasis: Secondary | ICD-10-CM | POA: Diagnosis not present

## 2023-12-21 DIAGNOSIS — K432 Incisional hernia without obstruction or gangrene: Secondary | ICD-10-CM | POA: Diagnosis not present

## 2023-12-21 DIAGNOSIS — Z4659 Encounter for fitting and adjustment of other gastrointestinal appliance and device: Secondary | ICD-10-CM | POA: Diagnosis not present

## 2023-12-21 DIAGNOSIS — R188 Other ascites: Secondary | ICD-10-CM | POA: Diagnosis not present

## 2023-12-21 DIAGNOSIS — D72829 Elevated white blood cell count, unspecified: Secondary | ICD-10-CM | POA: Diagnosis not present

## 2023-12-21 DIAGNOSIS — R935 Abnormal findings on diagnostic imaging of other abdominal regions, including retroperitoneum: Secondary | ICD-10-CM | POA: Diagnosis not present

## 2023-12-21 DIAGNOSIS — K5989 Other specified functional intestinal disorders: Secondary | ICD-10-CM | POA: Diagnosis not present

## 2023-12-22 DIAGNOSIS — J9811 Atelectasis: Secondary | ICD-10-CM | POA: Diagnosis not present

## 2023-12-22 DIAGNOSIS — I517 Cardiomegaly: Secondary | ICD-10-CM | POA: Diagnosis not present

## 2023-12-22 DIAGNOSIS — R935 Abnormal findings on diagnostic imaging of other abdominal regions, including retroperitoneum: Secondary | ICD-10-CM | POA: Diagnosis not present

## 2023-12-24 DIAGNOSIS — G8918 Other acute postprocedural pain: Secondary | ICD-10-CM | POA: Diagnosis not present

## 2023-12-24 DIAGNOSIS — L539 Erythematous condition, unspecified: Secondary | ICD-10-CM | POA: Diagnosis not present

## 2023-12-24 DIAGNOSIS — R935 Abnormal findings on diagnostic imaging of other abdominal regions, including retroperitoneum: Secondary | ICD-10-CM | POA: Diagnosis not present

## 2023-12-24 DIAGNOSIS — Z8719 Personal history of other diseases of the digestive system: Secondary | ICD-10-CM | POA: Diagnosis not present

## 2023-12-24 DIAGNOSIS — K432 Incisional hernia without obstruction or gangrene: Secondary | ICD-10-CM | POA: Diagnosis not present

## 2023-12-24 DIAGNOSIS — Z9889 Other specified postprocedural states: Secondary | ICD-10-CM | POA: Diagnosis not present

## 2023-12-25 DIAGNOSIS — G8918 Other acute postprocedural pain: Secondary | ICD-10-CM | POA: Diagnosis not present

## 2023-12-25 DIAGNOSIS — L539 Erythematous condition, unspecified: Secondary | ICD-10-CM | POA: Diagnosis not present

## 2023-12-25 DIAGNOSIS — K432 Incisional hernia without obstruction or gangrene: Secondary | ICD-10-CM | POA: Diagnosis not present

## 2023-12-25 DIAGNOSIS — Z8719 Personal history of other diseases of the digestive system: Secondary | ICD-10-CM | POA: Diagnosis not present

## 2023-12-25 DIAGNOSIS — Z9889 Other specified postprocedural states: Secondary | ICD-10-CM | POA: Diagnosis not present

## 2023-12-25 DIAGNOSIS — R935 Abnormal findings on diagnostic imaging of other abdominal regions, including retroperitoneum: Secondary | ICD-10-CM | POA: Diagnosis not present

## 2023-12-26 DIAGNOSIS — R935 Abnormal findings on diagnostic imaging of other abdominal regions, including retroperitoneum: Secondary | ICD-10-CM | POA: Diagnosis not present

## 2023-12-26 DIAGNOSIS — K432 Incisional hernia without obstruction or gangrene: Secondary | ICD-10-CM | POA: Diagnosis not present

## 2023-12-26 DIAGNOSIS — Z8719 Personal history of other diseases of the digestive system: Secondary | ICD-10-CM | POA: Diagnosis not present

## 2023-12-26 DIAGNOSIS — L539 Erythematous condition, unspecified: Secondary | ICD-10-CM | POA: Diagnosis not present

## 2023-12-26 DIAGNOSIS — G8918 Other acute postprocedural pain: Secondary | ICD-10-CM | POA: Diagnosis not present

## 2023-12-26 DIAGNOSIS — Z9889 Other specified postprocedural states: Secondary | ICD-10-CM | POA: Diagnosis not present

## 2023-12-27 ENCOUNTER — Other Ambulatory Visit: Payer: Self-pay

## 2023-12-27 DIAGNOSIS — R935 Abnormal findings on diagnostic imaging of other abdominal regions, including retroperitoneum: Secondary | ICD-10-CM | POA: Diagnosis not present

## 2023-12-27 MED ORDER — CEPHALEXIN 500 MG PO CAPS
500.0000 mg | ORAL_CAPSULE | Freq: Four times a day (QID) | ORAL | 0 refills | Status: AC
  Filled 2023-12-27: qty 24, 6d supply, fill #0

## 2023-12-27 MED ORDER — SENNOSIDES-DOCUSATE SODIUM 8.6-50 MG PO TABS
2.0000 | ORAL_TABLET | Freq: Two times a day (BID) | ORAL | 0 refills | Status: AC
  Filled 2023-12-27: qty 120, 30d supply, fill #0

## 2023-12-27 MED ORDER — OXYCODONE HCL 10 MG PO TABS
10.0000 mg | ORAL_TABLET | ORAL | 0 refills | Status: DC | PRN
  Filled 2023-12-27: qty 10, 2d supply, fill #0

## 2023-12-27 MED ORDER — POLYETHYLENE GLYCOL 3350 17 GM/SCOOP PO POWD
17.0000 g | Freq: Every day | ORAL | 0 refills | Status: AC
  Filled 2023-12-27: qty 238, 14d supply, fill #0

## 2023-12-27 MED ORDER — METHOCARBAMOL 500 MG PO TABS
1000.0000 mg | ORAL_TABLET | Freq: Three times a day (TID) | ORAL | 0 refills | Status: AC | PRN
  Filled 2023-12-27: qty 60, 10d supply, fill #0

## 2023-12-27 MED ORDER — DIPHENHYDRAMINE HCL 25 MG PO CAPS
25.0000 mg | ORAL_CAPSULE | Freq: Four times a day (QID) | ORAL | 0 refills | Status: AC | PRN
  Filled 2023-12-27: qty 30, 8d supply, fill #0

## 2023-12-27 MED ORDER — SIMETHICONE 80 MG PO CHEW
80.0000 mg | CHEWABLE_TABLET | Freq: Four times a day (QID) | ORAL | 0 refills | Status: AC | PRN
  Filled 2023-12-27: qty 30, 8d supply, fill #0

## 2023-12-27 MED ORDER — HYDROXYZINE PAMOATE 25 MG PO CAPS
25.0000 mg | ORAL_CAPSULE | Freq: Two times a day (BID) | ORAL | 0 refills | Status: DC
  Filled 2023-12-27: qty 20, 10d supply, fill #0

## 2023-12-27 MED ORDER — LIDOCAINE 4 % EX PTCH
1.0000 | MEDICATED_PATCH | Freq: Every day | CUTANEOUS | 0 refills | Status: AC
  Filled 2023-12-27: qty 5, 5d supply, fill #0

## 2023-12-27 MED ORDER — HYDROCORTISONE 1 % EX OINT
TOPICAL_OINTMENT | Freq: Four times a day (QID) | CUTANEOUS | 0 refills | Status: AC | PRN
  Filled 2023-12-27: qty 28.4, 30d supply, fill #0

## 2023-12-29 NOTE — Patient Instructions (Signed)
 Living With Attention Deficit Hyperactivity Disorder If you have been diagnosed with attention deficit hyperactivity disorder (ADHD), you may be relieved that you now know why you have felt or behaved a certain way. Still, you may feel overwhelmed about the treatment ahead. You may also wonder how to get the support you need and how to deal with the condition day-to-day. With treatment and support, you can live with ADHD and manage your symptoms. How to manage lifestyle changes Managing lifestyle changes can be challenging. Seeking support from your healthcare provider, therapist, family, and friends can be helpful. How to recognize changes in your condition The following signs may mean that your treatment is working well and your condition is improving: Consistently being on time for appointments. Being more organized at home and work. Other people noticing improvements in your behavior. Achieving goals that you set for yourself. Thinking more clearly. The following signs may mean that your treatment is not working very well: Feeling impatience or more confusion. Missing, forgetting, or being late for appointments. An increasing sense of disorganization and messiness. More difficulty in reaching goals that you set for yourself. Loved ones becoming angry or frustrated with you. Follow these instructions at home: Medicines Take over-the-counter and prescription medicines only as told by your health care provider. Check with your health care provider before taking any new medicines. General instructions Create structure and an organized atmosphere at home. For example: Make a list of tasks, then rank them from most important to least important. Work on one task at a time until your listed tasks are done. Make a daily schedule and follow it consistently every day. Use an appointment calendar, and check it 2-3 times a day to keep on track. Keep it with you when you leave the house. Create  spaces where you keep certain things, and always put things back in their places after you use them. Keep all follow-up visits. Your health care provider will need to monitor your condition and adjust your treatment over time. Where to find support Talking to others  Keep emotion out of important discussions and speak in a calm, logical way. Listen closely and patiently to your loved ones. Try to understand their point of view, and try to avoid getting defensive. Take responsibility for the consequences of your actions. Ask that others do not take your behaviors personally. Aim to solve problems as they come up, and express your feelings instead of bottling them up. Talk openly about what you need from your loved ones and how they can support you. Consider going to family therapy sessions or having your family meet with a specialist who deals with ADHD-related behavior problems. Finances Not all insurance plans cover mental health care, so it is important to check with your insurance carrier. If paying for co-pays or counseling services is a problem, search for a local or county mental health care center. Public mental health care services may be offered there at a low cost or no cost when you are not able to see a private health care provider. If you are taking medicine for ADHD, you may be able to get the generic form, which may be less expensive than brand-name medicine. Some makers of prescription medicines also offer help to patients who cannot afford the medicines that they need. Therapy and support groups Talking with a mental health care provider and participating in support groups can help to improve your quality of life, daily functioning, and overall symptoms. Questions to ask your health  care provider: What are the risks and benefits of taking medicines? Would I benefit from therapy? How often should I follow up with a health care provider? Where to find more information Learn more  about ADHD from: Children and Adults with Attention Deficit Hyperactivity Disorder: [employer]add[employer] of Mental Health: BloggerCourse.com Centers for Disease Control and Prevention: TonerPromos.no Contact a health care provider if: You have side effects from your medicines, such as: Repeated muscle twitches, coughing, or speech outbursts. Sleep problems. Loss of appetite. Dizziness. Unusually fast heartbeat. Stomach pains. Headaches. You have new or worsening behavior problems. You are struggling with anxiety, depression, or substance abuse. Get help right away if: You have a severe reaction to a medicine. These symptoms may be an emergency. Get help right away. Call 911. Do not wait to see if the symptoms will go away. Do not drive yourself to the hospital. Take one of these steps if you feel like you may hurt yourself or others, or have thoughts about taking your own life: Go to your nearest emergency room. Call 911. Call the National Suicide Prevention Lifeline at 662 550 4067 or 988. This is open 24 hours a day. Text the Crisis Text Line at 430-382-2444. Summary With treatment and support, you can live with ADHD and manage your symptoms. Consider taking part in family therapy or self-help groups with family members or friends. When you talk with friends and family about your ADHD, be patient and communicate openly. Keep all follow-up visits. Your health care provider will need to monitor your condition and adjust your treatment over time. This information is not intended to replace advice given to you by your health care provider. Make sure you discuss any questions you have with your health care provider. Document Revised: 04/21/2021 Document Reviewed: 04/21/2021 Elsevier Patient Education  2024 [employer].

## 2023-12-30 ENCOUNTER — Other Ambulatory Visit: Payer: Self-pay

## 2024-01-02 ENCOUNTER — Other Ambulatory Visit: Payer: Self-pay

## 2024-01-02 ENCOUNTER — Encounter: Payer: Self-pay | Admitting: Nurse Practitioner

## 2024-01-02 ENCOUNTER — Ambulatory Visit: Admitting: Nurse Practitioner

## 2024-01-02 VITALS — BP 121/77 | HR 96 | Temp 98.2°F | Resp 15 | Ht 70.0 in | Wt 225.8 lb

## 2024-01-02 DIAGNOSIS — F5104 Psychophysiologic insomnia: Secondary | ICD-10-CM

## 2024-01-02 DIAGNOSIS — Z87891 Personal history of nicotine dependence: Secondary | ICD-10-CM | POA: Diagnosis not present

## 2024-01-02 DIAGNOSIS — F4310 Post-traumatic stress disorder, unspecified: Secondary | ICD-10-CM

## 2024-01-02 DIAGNOSIS — Z48815 Encounter for surgical aftercare following surgery on the digestive system: Secondary | ICD-10-CM | POA: Diagnosis not present

## 2024-01-02 DIAGNOSIS — F330 Major depressive disorder, recurrent, mild: Secondary | ICD-10-CM | POA: Diagnosis not present

## 2024-01-02 DIAGNOSIS — F988 Other specified behavioral and emotional disorders with onset usually occurring in childhood and adolescence: Secondary | ICD-10-CM

## 2024-01-02 DIAGNOSIS — Z8719 Personal history of other diseases of the digestive system: Secondary | ICD-10-CM | POA: Diagnosis not present

## 2024-01-02 MED ORDER — ZOLPIDEM TARTRATE 10 MG PO TABS
10.0000 mg | ORAL_TABLET | Freq: Every evening | ORAL | 3 refills | Status: AC | PRN
  Filled 2024-01-02: qty 30, 30d supply, fill #0
  Filled 2024-01-20: qty 30, 30d supply, fill #1

## 2024-01-02 MED ORDER — AMPHETAMINE-DEXTROAMPHETAMINE 20 MG PO TABS
20.0000 mg | ORAL_TABLET | Freq: Two times a day (BID) | ORAL | 0 refills | Status: AC
  Filled 2024-01-20: qty 60, 30d supply, fill #0

## 2024-01-02 MED ORDER — AMPHETAMINE-DEXTROAMPHETAMINE 20 MG PO TABS: 20.0000 mg | ORAL_TABLET | Freq: Two times a day (BID) | ORAL | 0 refills | Status: AC

## 2024-01-02 MED ORDER — AMPHETAMINE-DEXTROAMPHETAMINE 20 MG PO TABS
20.0000 mg | ORAL_TABLET | Freq: Two times a day (BID) | ORAL | 0 refills | Status: AC
  Filled 2024-02-17: qty 60, 30d supply, fill #0

## 2024-01-02 MED ORDER — OXYCODONE HCL 5 MG PO TABS
10.0000 mg | ORAL_TABLET | ORAL | 0 refills | Status: DC | PRN
  Filled 2024-01-02: qty 20, 2d supply, fill #0

## 2024-01-02 NOTE — Assessment & Plan Note (Signed)
 Chronic, ongoing since middle school.  Will continue Adderall 20 MG BID as has been on for years with benefit.  Educated him on office rules for controlled substances.  He has signed controlled substance agreement, UDS due next 04/29/24 (last done 04/30/23 at pain clinic). Agrees to every 3 month visits.  Refills sent in today -- 3 refills predated.
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
## 2024-01-06 ENCOUNTER — Other Ambulatory Visit: Payer: Self-pay

## 2024-01-06 ENCOUNTER — Ambulatory Visit: Attending: Nurse Practitioner | Admitting: Nurse Practitioner

## 2024-01-06 ENCOUNTER — Encounter: Payer: Self-pay | Admitting: Nurse Practitioner

## 2024-01-06 VITALS — BP 134/84 | HR 99 | Temp 97.0°F | Resp 16 | Ht 70.0 in | Wt 225.0 lb

## 2024-01-06 DIAGNOSIS — R1032 Left lower quadrant pain: Secondary | ICD-10-CM | POA: Insufficient documentation

## 2024-01-06 DIAGNOSIS — G2581 Restless legs syndrome: Secondary | ICD-10-CM | POA: Diagnosis not present

## 2024-01-06 DIAGNOSIS — G5792 Unspecified mononeuropathy of left lower limb: Secondary | ICD-10-CM | POA: Diagnosis not present

## 2024-01-06 DIAGNOSIS — F4310 Post-traumatic stress disorder, unspecified: Secondary | ICD-10-CM | POA: Insufficient documentation

## 2024-01-06 DIAGNOSIS — G5782 Other specified mononeuropathies of left lower limb: Secondary | ICD-10-CM | POA: Insufficient documentation

## 2024-01-06 DIAGNOSIS — Z79899 Other long term (current) drug therapy: Secondary | ICD-10-CM | POA: Insufficient documentation

## 2024-01-06 DIAGNOSIS — M792 Neuralgia and neuritis, unspecified: Secondary | ICD-10-CM | POA: Diagnosis not present

## 2024-01-06 DIAGNOSIS — G894 Chronic pain syndrome: Secondary | ICD-10-CM | POA: Diagnosis not present

## 2024-01-06 MED ORDER — OXYCODONE-ACETAMINOPHEN 10-325 MG PO TABS
1.0000 | ORAL_TABLET | Freq: Two times a day (BID) | ORAL | 0 refills | Status: DC | PRN
  Filled 2024-01-20: qty 60, 30d supply, fill #0

## 2024-01-06 NOTE — Patient Instructions (Signed)
 " ______________________________________________________________________    Opioid Pain Medication Update  To: All patients taking opioid pain medications. (I.e.: hydrocodone , hydromorphone , oxycodone , oxymorphone, morphine , codeine, methadone, tapentadol, tramadol , buprenorphine , fentanyl , etc.)  Re: Updated review of side effects and adverse reactions of opioid analgesics, as well as new information about long term effects of this class of medications.  Direct risks of long-term opioid therapy are not limited to opioid addiction and overdose. Potential medical risks include serious fractures, breathing problems during sleep, hyperalgesia, immunosuppression, chronic constipation, bowel obstruction, myocardial infarction, and tooth decay secondary to xerostomia.  Unpredictable adverse effects that can occur even if you take your medication correctly: Cognitive impairment, respiratory depression, and death. Most people think that if they take their medication correctly, and as instructed, that they will be safe. Nothing could be farther from the truth. In reality, a significant amount of recorded deaths associated with the use of opioids has occurred in individuals that had taken the medication for a long time, and were taking their medication correctly. The following are examples of how this can happen: Patient taking his/her medication for a long time, as instructed, without any side effects, is given a certain antibiotic or another unrelated medication, which in turn triggers a Drug-to-drug interaction leading to disorientation, cognitive impairment, impaired reflexes, respiratory depression or an untoward event leading to serious bodily harm or injury, including death.  Patient taking his/her medication for a long time, as instructed, without any side effects, develops an acute impairment of liver and/or kidney function. This will lead to a rapid inability of the body to breakdown and eliminate  their pain medication, which will result in effects similar to an overdose, but with the same medicine and dose that they had always taken. This again may lead to disorientation, cognitive impairment, impaired reflexes, respiratory depression or an untoward event leading to serious bodily harm or injury, including death.  A similar problem will occur with patients as they grow older and their liver and kidney function begins to decrease as part of the aging process.  Background information: Historically, the original case for using long-term opioid therapy to treat chronic noncancer pain was based on safety assumptions that subsequent experience has called into question. In 1996, the American Pain Society and the American Academy of Pain Medicine issued a consensus statement supporting long-term opioid therapy. This statement acknowledged the dangers of opioid prescribing but concluded that the risk for addiction was low; respiratory depression induced by opioids was short-lived, occurred mainly in opioid-naive patients, and was antagonized by pain; tolerance was not a common problem; and efforts to control diversion should not constrain opioid prescribing. This has now proven to be wrong. Experience regarding the risks for opioid addiction, misuse, and overdose in community practice has failed to support these assumptions.  According to the Centers for Disease Control and Prevention, fatal overdoses involving opioid analgesics have increased sharply over the past decade. Currently, more than 96,700 people die from drug overdoses every year. Opioids are a factor in 7 out of every 10 overdose deaths. Deaths from drug overdose have surpassed motor vehicle accidents as the leading cause of death for individuals between the ages of 67 and 2.  Clinical data suggest that neuroendocrine dysfunction may be very common in both men and women, potentially causing hypogonadism, erectile dysfunction, infertility,  decreased libido, osteoporosis, and depression. Recent studies linked higher opioid dose to increased opioid-related mortality. Controlled observational studies reported that long-term opioid therapy may be associated with increased risk for cardiovascular events.  Subsequent meta-analysis concluded that the safety of long-term opioid therapy in elderly patients has not been proven.   Side Effects and adverse reactions: Common side effects: Drowsiness (sedation). Dizziness. Nausea and vomiting. Constipation. Physical dependence -- Dependence often manifests with withdrawal symptoms when opioids are discontinued or decreased. Tolerance -- As you take repeated doses of opioids, you require increased medication to experience the same effect of pain relief. Respiratory depression -- This can occur in healthy people, especially with higher doses. However, people with COPD, asthma or other lung conditions may be even more susceptible to fatal respiratory impairment.  Uncommon side effects: An increased sensitivity to feeling pain and extreme response to pain (hyperalgesia). Chronic use of opioids can lead to this. Delayed gastric emptying (the process by which the contents of your stomach are moved into your small intestine). Muscle rigidity. Immune system and hormonal dysfunction. Quick, involuntary muscle jerks (myoclonus). Arrhythmia. Itchy skin (pruritus). Dry mouth (xerostomia).  Long-term side effects: Chronic constipation. Sleep-disordered breathing (SDB). Increased risk of bone fractures. Hypothalamic-pituitary-adrenal dysregulation. Increased risk of overdose.  RISKS: Respiratory depression and death: Opioids increase the risk of respiratory depression and death.  Drug-to-drug interactions: Opioids are relatively contraindicated in combination with benzodiazepines, sleep inducers, and other central nervous system depressants. Other classes of medications (i.e.: certain antibiotics  and even over-the-counter medications) may also trigger or induce respiratory depression in some patients.  Medical conditions: Patients with pre-existing respiratory problems are at higher risk of respiratory failure and/or depression when in combination with opioid analgesics. Opioids are relatively contraindicated in some medical conditions such as central sleep apnea.   Fractures and Falls:  Opioids increase the risk and incidence of falls. This is of particular importance in elderly patients.  Endocrine System:  Long-term administration is associated with endocrine abnormalities (endocrinopathies). (Also known as Opioid-induced Endocrinopathy) Influences on both the hypothalamic-pituitary-adrenal axis?and the hypothalamic-pituitary-gonadal axis have been demonstrated with consequent hypogonadism and adrenal insufficiency in both sexes. Hypogonadism and decreased levels of dehydroepiandrosterone sulfate have been reported in men and women. Endocrine effects include: Amenorrhoea in women (abnormal absence of menstruation) Reduced libido in both sexes Decreased sexual function Erectile dysfunction in men Hypogonadisms (decreased testicular function with shrinkage of testicles) Infertility Depression and fatigue Loss of muscle mass Anxiety Depression Immune suppression Hyperalgesia Weight gain Anemia Osteoporosis Patients (particularly women of childbearing age) should avoid opioids. There is insufficient evidence to recommend routine monitoring of asymptomatic patients taking opioids in the long-term for hormonal deficiencies.  Immune System: Human studies have demonstrated that opioids have an immunomodulating effect. These effects are mediated via opioid receptors both on immune effector cells and in the central nervous system. Opioids have been demonstrated to have adverse effects on antimicrobial response and anti-tumour surveillance. Buprenorphine  has been demonstrated to have  no impact on immune function.  Opioid Induced Hyperalgesia: Human studies have demonstrated that prolonged use of opioids can lead to a state of abnormal pain sensitivity, sometimes called opioid induced hyperalgesia (OIH). Opioid induced hyperalgesia is not usually seen in the absence of tolerance to opioid analgesia. Clinically, hyperalgesia may be diagnosed if the patient on long-term opioid therapy presents with increased pain. This might be qualitatively and anatomically distinct from pain related to disease progression or to breakthrough pain resulting from development of opioid tolerance. Pain associated with hyperalgesia tends to be more diffuse than the pre-existing pain and less defined in quality. Management of opioid induced hyperalgesia requires opioid dose reduction.  Cancer: Chronic opioid therapy has been associated with an increased  risk of cancer among noncancer patients with chronic pain. This association was more evident in chronic strong opioid users. Chronic opioid consumption causes significant pathological changes in the small intestine and colon. Epidemiological studies have found that there is a link between opium  dependence and initiation of gastrointestinal cancers. Cancer is the second leading cause of death after cardiovascular disease. Chronic use of opioids can cause multiple conditions such as GERD, immunosuppression and renal damage as well as carcinogenic effects, which are associated with the incidence of cancers.   Mortality: Long-term opioid use has been associated with increased mortality among patients with chronic non-cancer pain (CNCP).  Prescription of long-acting opioids for chronic noncancer pain was associated with a significantly increased risk of all-cause mortality, including deaths from causes other than overdose.  Reference: Von Korff M, Kolodny A, Deyo RA, Chou R. Long-term opioid therapy reconsidered. Ann Intern Med. 2011 Sep 6;155(5):325-8. doi:  10.7326/[id number]. PMID: 78106373; PMCID: [id number]. Kit JINNY Laurence CINDERELLA Pearley JINNY, Hayward RA, Dunn KM, Jordan KP. Risk of adverse events in patients prescribed long-term opioids: A cohort study in the [place] Clinical Practice Research [employer]. Eur J Pain. 2019 May;23(5):908-922. doi: 10.1002/ejp.1357. Epub 2019 Jan 31. PMID: 69379883. Colameco S, Coren JS, Ciervo CA. Continuous opioid treatment for chronic noncancer pain: a time for moderation in prescribing. Postgrad Med. 2009 Jul;121(4):61-6. doi: 10.3810/pgm.[id number]. PMID: 80358728. Gigi JONELLE Shlomo MILUS Levern IVER Conny RN, Morrice SD, Blazina I, Lonell DASEN, Bougatsos C, Deyo RA. The effectiveness and risks of long-term opioid therapy for chronic pain: a systematic review for a [employer] of [employer] Pathways to [employer]. Ann Intern Med. 2015 Feb 17;162(4):276-86. doi: 10.7326/M14-2559. PMID: 74418742. Rory CHRISTELLA Laurence [facility], Makuc DM. NCHS Data Brief No. 22. [place]: Centers for Disease Control and Prevention; 2009. Sep, Increase in Fatal Poisonings Involving Opioid Analgesics in the [place] , 1999-2006. Song IA, Choi HR, Oh TK. Long-term opioid use and mortality in patients with chronic non-cancer pain: Ten-year follow-up study in [place] from 2010 through 2019. EClinicalMedicine. 2022 Jul 18;51:101558. doi: 10.1016/j.eclinm.[id number]. PMID: 64124182; PMCID: [id number]. Huser, W., Schubert, T., Vogelmann, T. et al. All-cause mortality in patients with long-term opioid therapy compared with non-opioid analgesics for chronic non-cancer pain: a database study. BMC Med 18, 162 (2020). [email or web address].info/ Rashidian H, Zendehdel K, Kamangar F, Malekzadeh R, Haghdoost AA. An Ecological Study of the Association between Opiate Use and Incidence of Cancers. Addict Health. 2016 Fall;8(4):252-260. PMID: 71180443; PMCID: [id number].  Our Goal: Our goal is to control your pain with means other  than the use of opioid pain medications.  Our Recommendation: Talk to your physician about coming off of these medications. We can assist you with the tapering down and stopping these medicines. Based on the new information, even if you cannot completely stop the medication, a decrease in the dose may be associated with a lesser risk. Ask for other means of controlling the pain. Decrease or eliminate those factors that significantly contribute to your pain such as smoking, obesity, and a diet heavily tilted towards inflammatory nutrients.  Last Updated: 07/23/2022   ______________________________________________________________________       ______________________________________________________________________    WARNING: CBD (cannabidiol) & Delta (Delta-8 tetrahydrocannabinol) products.   Applicable to:  All individuals currently taking or considering taking CBD (cannabidiol) and, more important, all patients taking opioid analgesic controlled substances (pain medication). (Example: oxycodone ; oxymorphone; hydrocodone ; hydromorphone ; morphine ; methadone; tramadol ; tapentadol; fentanyl ; buprenorphine ; butorphanol; dextromethorphan; meperidine ; codeine; etc.)  Introduction:  Recently there has been a drive towards the use of natural  products for the treatment of different conditions, including pain anxiety and sleep disorders. Marijuana and hemp are two varieties of the cannabis genus plants. Marijuana and its derivatives are illegal, while hemp and its derivatives are not. Cannabidiol (CBD) and tetrahydrocannabinol (THC), are two natural compounds found in plants of the Cannabis genus. They can both be extracted from hemp or marijuana. Both compounds interact with your bodys endocannabinoid system in very different ways. CBD is associated with pain relief (analgesia) while THC is associated with the psychoactive effects (the high) obtained from the use of marijuana products. There are  two main types of THC: Delta-9, which comes from the marijuana plant and it is illegal, and Delta-8, which comes from the hemp plant, and it is legal. (Both, Delta-9-THC and Delta-8-THC are psychoactive and give you the high.)   Legality:  Marijuana and its derivatives: illegal Hemp and its derivatives: Legal (State dependent) UPDATE: (03/03/2021) The Drug Enforcement Agency (DEA) issued a letter stating that delta cannabinoids, including Delta-8-THCO and Delta-9-THCO, synthetically derived from hemp do not qualify as hemp and will be viewed as Schedule I drugs. (Schedule I drugs, substances, or chemicals are defined as drugs with no currently accepted medical use and a high potential for abuse. Some examples of Schedule I drugs are: heroin, lysergic acid diethylamide (LSD), marijuana (cannabis), 3,4-methylenedioxymethamphetamine (ecstasy), methaqualone, and peyote.) (cuetune.com.ee)  Legal status of CBD in :  Conditionally Legal  Reference: FDA Regulation of Cannabis and Cannabis-Derived Products, Including Cannabidiol (CBD) - oemdeals.dk  Warning:  CBD is not FDA approved and has not undergo the same manufacturing controls as prescription drugs.  This means that the purity and safety of available CBD may be questionable. Most of the time, despite manufacturer's claims, it is contaminated with THC (delta-9-tetrahydrocannabinol - the chemical in marijuana responsible for the HIGH).  When this is the case, the [facility] contaminant will trigger a positive urine drug screen (UDS) test for Marijuana (carboxy-THC).   The FDA recently put out a warning about 5 things that everyone should be aware of regarding Delta-8 THC: Delta-8 THC products have not been evaluated or approved by the FDA for safe use and may be marketed in ways that put the public health at risk. The FDA has  received adverse event reports involving delta-8 THC-containing products. Delta-8 THC has psychoactive and intoxicating effects. Delta-8 THC manufacturing often involve use of potentially harmful chemicals to create the concentrations of delta-8 THC claimed in the marketplace. The final delta-8 THC product may have potentially harmful by-products (contaminants) due to the chemicals used in the process. Manufacturing of delta-8 THC products may occur in uncontrolled or unsanitary settings, which may lead to the presence of unsafe contaminants or other potentially harmful substances. Delta-8 THC products should be kept out of the reach of children and pets.  NOTE: Because a positive UDS for any illicit substance is a violation of our medication agreement, your opioid analgesics (pain medicine) may be permanently discontinued.  MORE ABOUT CBD  General Information: CBD was discovered in [birth year] and it is a derivative of the cannabis sativa genus plants (Marijuana and Hemp). It is one of the 113 identified substances found in Marijuana. It accounts for up to 40% of the plant's extract. As of 2018, preliminary clinical studies on CBD included research for the treatment of anxiety, movement disorders, and pain. CBD is available and consumed in multiple forms, including inhalation of smoke or vapor, as an aerosol spray, and by mouth. It may be supplied as an  oil containing CBD, capsules, dried cannabis, or as a liquid solution. CBD is thought not to be as psychoactive as THC (delta-9-tetrahydrocannabinol - the chemical in marijuana responsible for the HIGH). Studies suggest that CBD may interact with different biological target receptors in the body, including cannabinoid and other neurotransmitter receptors. As of 2018 the mechanism of action for its biological effects has not been determined.  Side-effects  Adverse reactions: Dry mouth, diarrhea, decreased appetite, fatigue, drowsiness, malaise, weakness, sleep  disturbances, and others.  Drug interactions:  CBD may interact with medications such as blood-thinners. CBD causes drowsiness on its own and it will increase drowsiness caused by other medications, including antihistamines (such as Benadryl ), benzodiazepines (Xanax, Ativan , Valium ), antipsychotics, antidepressants, opioids, alcohol and supplements such as kava, melatonin and St. John's Wort.  Other drug interactions: Brivaracetam (Briviact); Caffeine ; Carbamazepine (Tegretol); Citalopram (Celexa); Clobazam (Onfi); Eslicarbazepine (Aptiom); Everolimus (Zostress); Lithium; Methadone (Dolophine); Rufinamide (Banzel); Sedative medications (CNS depressants); Sirolimus (Rapamune); Stiripentol (Diacomit); Tacrolimus (Prograf); Tamoxifen ; Soltamox); Topiramate (Topamax); Valproate; Warfarin (Coumadin); Zonisamide. (Last update: 12/25/2021) ______________________________________________________________________     ______________________________________________________________________    Update on Controlled Substance (Opioid) Regulations   To: All patients taking opioid pain medications. (I.e.: hydrocodone , hydromorphone , oxycodone , oxymorphone, morphine , codeine, methadone, tapentadol, tramadol , buprenorphine , fentanyl , etc.)  Re: Review on the state of controlled substance regulations.  Introduction: Rules and regulations associated with all aspects of controlled substances are constantly being modified. Unfortunately we have encountered patients questioning the veracity of the information that we provide them about these changes. This is intended to provide them with appropriate references and a historical review of these changes.  A Brief History: As of October 31, 2015, the [place]  Government declared the opioid epidemic a public health emergency. Prescription drug monitoring programs (PDMPs) and the [facility] All Schedules Prescription Electronic Reporting Act (NASPER). Before 1800, clinicians regarded  pain as an existential phenomenon, a consequence of aging. There was no regulation on the use of cocaine and opioids, resulting in widespread marketing and prescribing for many ailments ranging from diarrhea to toothache. The [employer] of [phone], passed in response to the sudden emergence of street heroin abuse as well as iatrogenic morphine  dependence, influenced both physician and patient alike to avoid opiates. Patients with unexplained pain in the 1920s were regarded as deluded, malingering, or abusers, and cancer patients through the 1950s were encouraged to wean themselves off opioids until their lives could be measured in weeks. Alongside this opioid evolution, the American Pain Society launched their influential pain as the fifth vital sign campaign in 1995. Concurrently, pharmaceutical companies introduced new formulations, such as extended release oxycodone  (OxyContin ). From 1997 to 2002, OxyContin  prescriptions increased from 670,000 to 6.2 million. However, concerns soon began to surface regarding overzealous opioid treatment. It must be noted that pharmaceutical companies contributed significantly to the rise of the opioid epidemic, receiving considerable reprimands as a consequence. In 2007, as the opioid epidemic began to inflict profound damage, [employer] pleaded guilty to federal charges related to the misbranding of OxyContin . [employer] agreed to pay a total of $634.5 million to resolve Justice Department investigations, as well as a $19.5 million settlement to [address] and the [address] of [place].  In response to the current epidemic, changes in focus to the development of new abuse deterrent opioid formulations at the [place]  Food and Drug Administration (FDA) as well as drafting of new public standards for pain treatment were created at [facility] in 2017. In response to the opioid epidemic, FDA public policy  changes were announced in February 2016. Among these new positions were a  re-examination of the risk-benefit paradigm for opioids with strict emphasis on the large public health ramifications. The various modified opioids released over the past 20 years, such as tamper-resistant preparation, have had differing levels of success, and are collectively referred to as Risk Evaluation and Mitigation Strategies (REMS). There is also a growing focus on preventing opioid use disorder (OUD) and on offering affected individuals accessible and effective treatment. [place]  government policy reflects these changes and both the Affordable Care Act and the Mental Health Parity and Addiction Equity Act were major steps forward in treating opioid addiction. The Affordable Care Act, which was signed into law in 01/30/2009, with major provisions coming into effect by 2013/01/30.  In the 1990s, the intensified marketing of newly reformulated prescription opioid medications (e.g., OxyContin ) and an influential pain advocacy campaign that encouraged greater pain management led to a precipitous rise in opioid use in the [place] . Research from the Centers for Disease Control and Prevention (CDC) shows that prescription opioid sales in the [place]  quadrupled from 1998/01/30 to 01-30-2009. At the same time, opioid misuse and opioid-involved overdose deaths increased (Figure 1). Between 01-30-98 and 01-30-09, the rate of opioidinvolved overdose deaths in the [place]  doubled from 2.9 to 6.8 deaths per 100,000 people. This initial rise in opioid-related deaths is often referred to as the first wave of the recent opioid crisis.  Between Jan 30, 1998 and Jan 31, 2019, 565,000 Americans died of opioid-involved overdoses. In turn, federal, state, and local governments responded with various legal and policy efforts to curb opioid misuse and drug-related overdose Deaths.  Recent Congresses have enacted several laws addressing the opioid crisis, such as the Comprehensive Addiction and Recovery Act of 01-31-2015 (CARA, P.L. [phone]); the  21st Century Cures Act (P.L. 114-255); the Substance UseDisorder Prevention that Promotes Opioid Recovery and Treatment for Patients and Communities Act (SUPPORT Act, P.L. (716) 038-4272); the Fentanyl  Sanctions Act (Title LXXII of P.L. [id number]); and the Blocking Deadly Fentanyl  Imports Act (P.L. 117-81, 6610). These laws addressed overprescribing and misuse of opioids, expanded substance use disorder prevention and treatment capacities, bolstered drug diversion capabilities, and enhanced international drug interdiction, counternarcotics cooperation, and sanctions efforts. Congress also directed additional funds to many of these initiatives through appropriations.  Congress provided funding in the [place] [employer] Act of 31-Jan-2020 [phone]; P.L. 117-2) for syringe services programs (often known as needle exchange programs) and other harm reduction initiatives. Federal and state harm reduction strategies have frequently involved the distribution of naloxone (e.g., Narcan)--a medication used to reverse an opioid overdose--and test strips used to detect fentanyl  in drug samples.  The Department of Justice (DOJ) and Department of Homeland Security [facility]) aim to reduce the diversion of prescription opioids and the use, manufacturing, and trafficking of illicit opioids. DOJ--via the Drug Enforcement Administration (DEA)--regulates opioid manufacturers, distributors, and dispensers; it also controls the opioid supply through enforcement of regulatory requirements.  A History of Opiate Laws in the [place]   Prior to 01-30-89, laws concerning opiates were strictly imposed on a local city or state-by-state basis. One of the first was in [place] in January 30, 1874 where it became illegal to smoke opium  only in opium  dens. It did not ban the sale, import or use otherwise. In the next 25 years different states enacted opium  laws ranging from outlawing opium  dens altogether to making possession of opium , morphine   and heroin without a physicians prescription illegal.  The first Congressional Act took place in  1890 that levied taxes on morphine  and opium . From that time on the [employer] has had a series of laws and acts directly aimed at opiate use, abuse and control. These are outlined below:  1906 - Pure Food and Drug Act Preventing the manufacture, sale, or transportation of adulterated or misbranded or poisonous or deleterious foods, drugs, medicines, and liquors, and for regulating traffic therein, and for other purposes. Punishment included fines and prison time.  1909   - Smoking Opium  Exclusion Act Banned the importation, possession and use of smoking opium . Did not regulate opium -based medications. First Freight forwarder banning the non-medical use of a substance.  1914  - The Margrette Act In summary, The Margrette Act of 1914 was written more to have all parties involved in importing, exporting, set designer and distributing opium  or cocaine to register with the [employer] and have taxes levied upon them. Exempt from the law were physicians operating in the course of his professional practice  1919 - Supreme Court ratified the Bj's in [place] et al., v. [place]  and [place]  v. [place], then again in [facility] v. [place] , in 1920, holding that doctors may not prescribe maintenance supplies of narcotics to people addicted to narcotics. However, it does not prohibit doctors from prescribing narcotics to wean a patient off of the drug. It was also the opinion of the court that prescribing narcotics to habitual users was not considered professional practice hence it then was considered illegal for doctors to prescribe opioids for the purposes of maintaining an addiction. It can be argued that todays addiction medications are not intended to maintain an addiction but to facilitate addiction remission. In which case, this opinion of the court should  not preclude practitioners from prescribing buprenorphine  or methadone to patients suffering from an addictive disorder.  1924  - Heroin Act Architectural technologist, importation and possession of heroin illegal - even for medicinal use.  1922 -- Narcotic Drug Import and Export Act Enacted to assure proper control of importation, sale, possession, production and consumption of narcotics.  1927  -- Special Educational Needs Teacher of Prohibition [employer] of Prohibition was responsible for tracking bootleggers and organized conservation officer, historic buildings. They focused primarily on interstate and international cases and those cases where local law enforcement official would not or could not act.  1932 -- Uniform State Narcotic Act Encouraged states to pass uniform state laws matching the federal Narcotic Drug Import and Export Act. Suggested prohibiting cannabis use at the state level.  19 -- Food, Drug, and Cosmetic Act The new law brought cosmetics and medical devices under control, and it required that drugs be labeled with adequate directions for safe use. Moreover, it mandated pre-market approval of all new drugs, such that a manufacturer would have to prove to FDA that a drug were safe before it could be sold  1951 -- Boggs Act Imposed maximum criminal penalties for violations of the import/export and internal revenue laws related to drugs and also established mandatory minimum prison sentences.  1956 -- Narcotics Control Act Increased Boggs Act penalties and mandatory prison sentence minimums for violations of existing drug laws.  1965 -- Drug Abuse Control Amendment Enacted to deal with problems caused by abuse of depressants, stimulants and hallucinogens. Restricted research into psychoactive drugs such as LSD by requiring FDA approval.  1970 -- Controlled Substance Act  Controlled Substances Import and Export Act These laws are a consolidation of numerous laws regulating the manufacture and distribution of narcotics,  stimulants,  depressants, hallucinogens, anabolic steroids, and chemicals used in the illicit production of controlled substances. The CSA places all substances that are regulated under existing federal law into one of five schedules. This placement is based upon the substance's medicinal value, harmfulness, and potential for abuse or addiction. Schedule I is reserved for the most dangerous drugs that have no recognized medical use, while Schedule V is the classification used for the least dangerous drugs. The act also provides a mechanism for substances to be controlled, added to a schedule, decontrolled, removed from control, rescheduled, or transferred from one schedule to another.  65 - Drug Enforcement Agency By Executive Order, the DEA was formed to take place of the [employer] of Narcotics and Dangerous Drugs.  75 -Narcotic Addict Treatment Act of  1974  - Public Law [phone] Amends the Controlled Substance Act of 1970 to provide for the registration of practitioners conducting narcotic treatment programs. [methadone clinics] It also provides legal definitions for the phrases maintenance treatment and detoxification treatment.  1986 -- Anti-Drug Abuse Act of 1986 Strengthened Federal efforts to encourage foreign cooperation in eradicating illicit drug crops and in halting international drug traffic, to improve enforcement of Federal drug laws and enhance interdiction of illicit drug shipments, to provide strong Market researcher in establishing effective drug abuse prevention and education programs, to [employer] support for drug abuse treatment and rehabilitation efforts, and for other purposes. It also re-imposed mandatory sentencing minimums depending on which drug and how much was involved.  1988 -- Anti-Drug Abuse Act of 1988 Established the Office of Materials Engineer (ONDCP) in the [employer] of the Economist; authorized funds for [employer], state and local drug  enforcement activities, school-based drug prevention efforts, and drug abuse treatment with special emphasis on injecting drug abusers at high risk for AIDS.  2000 -- Federal - The Drug Addiction Treatment Act of 2000 (DATA 2000) It enables qualified physicians to prescribe and/or dispense narcotics for the purpose of treating opioid dependency. For the first time, physicians are able to treat this disease from their private offices or other clinical settings. This presents a very desirable treatment option for those who are unwilling or unable to seek help in drug treatment clinics. Patients can now be treated in the privacy of their doctors office, as are other people being treated for any other type of medical condition. One medicine doctors may now prescribe is Buprenorphine . The major downfall of this Act is the limitation of 30 patients per practice - which means that large facilities, no matter how many physicians are there, can only treat 30 patients at a time.  2002-- DEA reschedules buprenorphine  from a schedule V drug to a schedule III drug, on October 21, 2000 - the day before the FDA approval of Suboxone and Subutex  despite overwhelming objection by the medical community.  2004: June 2004 THE CONFIDENTIALITY OF ALCOHOL AND DRUG ABUSE PATIENT RECORDS REGULATION AND THE HIPAA PRIVACY RULE:  Confidentiality of Alcohol and Drug Dependence Patient Records (summary) Code of Federal Regulations Title 42 Part 2 (42 CFR Part 2)  The confidentiality of alcohol and drug dependence patient records maintained by this practice/program is protected by federal law and regulations. Generally, the practice/program may not say to a person outside the practice/program that a patient attends the practice/program, or disclose any information identifying a patient as being alcohol or drug dependent unless:  The patient consents in writing; The disclosure is allowed by a court order, or The disclosure is made  to medical personnel in a medical emergency or to qualified personnel for research,  audit, or practice/program evaluation. Violation of the federal law and regulations by a practice/program is a crime. Suspected violations may be reported to appropriate authorities in accordance with federal regulations. Freight forwarder and regulations do not protect any information about a crime committed by a patient either at the practice/program or against any person who works for the practice/program or about any threat to commit such a crime. Federal laws and regulations do not protect any information about suspected child abuse or neglect from being reported under state law to appropriate state or local authorities.  sample consent form (MS-WORD)  2005: 08-17-2003 Public law [phone], Amends the Controlled Substances Act to eliminate the 30-patient limit for medical group practices allowed to dispense narcotic drugs in schedules III, IV, or V for maintenance or detoxification treatment (retains the 30-patient limit for an individual physician). This amendment removes the 30-patient limit on group medical practices that treat opioid dependence with buprenorphine . The restriction was part of the original Drug Addiction Treatment Act of 2000 (DATA) that allowed treatment of opioid dependence in a doctor's office. With this change, every certified doctor may now prescribe buprenorphine  up to his or her individual physician limit of 30 patients.  2006: On 01/12/2005 President Levy signed Bill H.R.6344 into law. This allows physicians who have been certified to prescribe certain drugs for the treatment of opioid dependence under DATA2000 to treat up to 100 patients (up from 30) by submitting an intent notification to the Dept of Health and [employer]. This is a major step forward in both fighting the stigma and allowing access to treatment previously not available to some. For more details see 30/100-PATIENT LIMIT  2016:  HHS augments regulations concerning the 30/100 patient limit by raising the limit to 275 for qualifying physicians. Link to summary of regulation  2016: Comprehensive Addiction and Recovery Act of 2016 (sec.303) amends the Controlled Substance Act - to allow Nurse Practitioners and Physician Assistants to become eligible to prescribe buprenorphine  for the treatment of opioid use disorder. See the entire law for more details.  The roots of the concurrent regulation of certain drugs under two statutory schemes go back to the beginning of this century. In 1906, Congress enacted the Pure Food and Drug Act, establishing one regime of regulation to assure (among other things) that drugs were not adulterated or misbranded. These regulations were amended several times, recodified in 1938, and expanded on again from the 1940s through the 1990s. Their implementation and enforcement is today assigned to the Food and Drug Administration (FDA) in the Department of Health and Human Services [facility]).  In 1914, Congress adopted the [place] Narcotic Act to stop abuse of addictive drugs. The Margrette Narcotic Act was amended in 1937 to include marijuana. In 1965, amphetamines, barbiturates, and hallucinogens came under regulation, but under the [employer], Drug, and Cosmetic Act. In 1970, these various statutes were consolidated and recodified as the Controlled Substances Act (CSA), which has been amended several times since then. Its implementation and enforcement is today assigned to the Drug Enforcement Administration (DEA) in the Department of Justice.  The first clash occurred after World War I, when so-called morphine  clinics existed and physicians prescribed or dispensed morphine  to addicts. Some addicts were veterans of the American Civil War, the Spanish-American War, and WWI, who had become addicted during treatment for war wounds, but most of them came from the growing population of nonmedical addicts  (  Courtwright, 1982). The Narcotics Division of the Prohibition Unit of the Department of the Treasury, which was then responsible for enforcing the [facility] Narcotic Act, concluded that this activity was not the legitimate practice of medicine but simple drug trafficking. The Treasury Department swiftly closed the clinics and made it personally and professionally risky for physicians to maintain a narcotic addict for any reason. In did so, however, only after the American Medical Association had adopted a resolution, in 1920, opposing ambulatory clinics''.  In 1972, the public health establishment, including the Secretary of Health, Education, and Welfare, the Education Officer, Environmental, the [employer] of [employer], and the Chemical Engineer for Drug Abuse Prevention, was unprepared to allow [employer] of Narcotics and Dangerous Drugs, DEA's predecessor agency, to unilaterally define the parameters of medical practice for the use of methadone in the treatment of heroin addiction. As a consequence, a new set of rules--the third, on top of the FDA and DEA schemes--was added, one that inserted FDA deeply into the practice of medicine, notwithstanding its protestations to the contrary. Congress ratified this joint responsibility of law enforcement and public health officials for methadone through this third set of rules in 1974 with the passage of the Narcotic Addict Treatment Act (NATA). To examine in detail the evolution of this third set of rules--commonly referred to as the FDA or DHHS methadone regulations--we turn, first, to the period of the mid-1960s.  Increased use of heroin in the post World War II period first became apparent in the early to mid 1950s. During the [employer], a minimum mandatory narcotics law was enacted in 1956, effective July 1957. 1962 [facility] conference on drug abuse, the [employer] on Narcotic and Drug Abuse (the [employer]) of 1963, the Drug Abuse Control Amendments of 1965, the President's Commission on [employer] and Administration of Justice (the [employer]) of [phone], and the Narcotic Addiction Rehabilitation Act of 1966.  The 1965 Drug Abuse Control Amendments brought under strict federal control all nonnarcotic drugs capable of producing serious psychotoxic effects when abused. This act also created the [employer] of Drug Abuse Control within the Department of Health, Education, and Welfare (DHEW) and shifted the basis for aon corporation of illegal drugs from tax principles (administered by the Department of Treasury) to the regulation of commerce (administered by the [employer]).  The 1966 Narcotic Addiction Rehabilitation Act TOUR MANAGER) authorized the civil commitment of narcotic addicts, and federal assistance to state and local governments to develop a local system of drug treatment programs. With respect to the latter, the [employer] of Mental Health [facility]) initially proposed the gradual implementation of the state assistance effort, mainly through a common mental health mechanism--inpatient treatment programs. However, because of a perceived pressing need, the courts began to commit addicts to these programs even before they were officially opened or staffed. The NARA legislation imposed the following contract requirements on treatment centers: (1) thrice-a-week counseling sessions; (2) weekly urine tests; (3) restorative dental services; (4) psychological consultations and vocational training; and (5) the treatment modalities of drug-free outpatient, therapeutic community, and methadone maintenance. Reorganization Plan No. 1 of 1968 transferred the primary functions of the [employer] of Narcotics (FBN) from the [employer] to the Department of Justice; it also transferred the [employer] of Drug Abuse Control functions to the Department of Justice. Within the [place], the [employer] of Narcotics and Dangerous Drugs (BNDD) was created, which became the Drug Enforcement Administration in 1973.  Under the first Hubbard administration [phone]), federal drug abuse policy developed in a significant way. These developments included a 1969 war on drugs presidential message, resulting legislation in 1970, and a Special Action Office created by executive order in 1971 and authorized in statute in 1972. Brynn, in 1969, to send a message to Congress on drug abuse. Although this was the first time that a U.[place]. president invoked the war on drugs image, it was in retrospect the most balanced approach to the problem of drug abuse that had been advanced. The 1969 message resulted in the submission of legislation to the Congress and the passage, the following year, of the Comprehensive Drug Abuse Prevention and Control Act of 1970 [employer] [phone], November 10, 1968). The act dealt with research, treatment, and prevention of drug abuse and drug dependence, and with drug abuse charity fundraiser. One major purpose of the 1970 legislation was to reverse some of the strictures of the [employer] of 1914. The 1970 act sought to clarify for the medical profession . . . the extent to which they may safely go in treating narcotic addicts as patients. Title I, in Section IV, charged the Surveyor, Minerals, Education, and Welfare, to determine the appropriate methods of professional practice in the medical treatment of the narcotic addiction of various classes of narcotic addicts. This provision constitutes the initial statutory basis for treatment standards. The law enforcement sections consolidated all prior federal statutes into the Controlled Substances Act and the Controlled Substances Export and Import Act (Titles II and III, respectively, of the Comprehensive Drug Abuse Prevention and Control Act of 1970). Under this legislation, substances were classified under five  schedules according to their abuse potential, and psychological and physical effects. Methadone was placed in Schedule II, along with such opiate drugs as morphine , codeine, and hydrocodone .  One of the most important steps taken by President Brynn was to establish in June 1971 the Special Action Office for Drug Abuse Prevention (SAODAP) in the [employer] of the President ([place] [phone], July 01, 1969). In mid-1971, [facility] appointed Dr. Maple Dunnings as SAODAP director. Within a year, the Drug Abuse Prevention Office and Treatment Act of 1972 [employer] [phone], April 05, 1970) gave statutory authority to [facility], but limiting setting, on July 14, 1973, as the limit on its existence.  The purpose of the 1972 act was to bring the resources of the federal government to bear on drug abuse with the immediate objective of significantly reducing its incidence and developing a comprehensive, coordinated long-term federal strategy to combat drug abuse.  Narcotic Addict Treatment Act [employer]) of 1974 [employer] [phone]), which amended the Controlled Substances Act. This legislation was driven by concern for the diversion of methadone to illicit channels that was occurring in 1972 and 1973, as reflected in the title of the Senate bill adopted on June 23, 1971, the Methadone Diversion Control Act of 1973. (U.S. Senate, 1970a, 8029a).  The 1980 final rule (45 FR 37305, October 04, 1978) reduced the minimum standard for admission from two years of addiction to one year coupled with a clinical determination that the individual was currently physiologically.  The regulations were next revised in 1989, following two proposals to modify them, one in 1983 and one in 1987.  Under President Tanda Corrente, a government-wide effort was made to review all federal government regulations and to eliminate or reduce the burden of these regulations on the private sector, state and nash-finch company, and nonprofit  organizations.   The 1983 recommendations, though not adopted, did initiate another revision of the methadone regulations, which first found expression in a 1987 proposed rule (52 FR 37047, October 16, 1985) and culminated in a final rule (54 FR 8954, March 17, 1987) at the end of the decade. In the 1987 proposed rule, the FDA and NIDA, in an effort to put the best face on the unenthusiastic 1983 response by the provider community to converting the regulations to guidelines, indicated that they had retained the current requirements necessary to achieve the goals of the 1974 NATA, but were proposing to streamline the regulation and to promote more efficient operation of methadone programs. The 1987 proposed rule, issued by the FDA and NIDA, advanced the following changes in the methadone regulation: that detoxification treatment be divided into short-term (<21 days) and long-term (>21 and <180 days) treatment; that the minimum staffing ratio of one counselor to 50 patients be eliminated; that blood tests be allowed as ways to conduct initial drug screening or to meet the monthly testing requirements for six-day take-home patients; that the 72-hour notification of FDA and the pertinent state authority for methadone doses greater than 100 mg be eliminated; that special adverse reaction reporting requirements for methadone be eliminated and reliance placed upon general FDA reporting requirements; that a supervising counselor be allowed to conduct the annual review of the patient's treatment plan for certain qualified patients who had been in treatment for 3 years or longer; and that the requirement of an annual report of methadone treatment programs to the FDA be dropped. The FDA and NIDA issued a final rule on March 17, 1987, based on comments on the 1987 proposed (54 FR 8954). Concurrently, FDA and NIDA issued a six-page guidance document, which noted that the regulations, over time, had recommended certain  practices that were not actually required. Public Health Service, in Congress, and elsewhere, to reorganize the Alcohol, Drug Abuse, and Mental Health Administration (ADAMHA). These efforts culminated in the [employer] of 1992 [employer] [phone], July 25, 1990), the main purpose of which was to transfer the research portions of the three ADAMHA institutes--NIDA, the [employer] of Alcoholism and Alcohol Abuse, and the [employer] of Mental Health--to the [employer] and to create the Substance Abuse and Museum/gallery Exhibitions Officer [facility]) as the home for the service functions of these entitles.  Guidelines for Opioid Treatment The Federal Guidelines for Opioid Treatment Programs - 2015 serve as a guide to accrediting organizations for developing accreditation standards. The guidelines also provide OTPs with information on how programs can achieve and maintain compliance with federal regulations. The 2015 guidelines are an update to the 2007 Guidelines for the Accreditation of Opioid Treatment Programs (PDF  547 KB). The new document reflects the obligation of OTPs to deliver care consistent with the patient-centered, integrated, and recovery-oriented standards of substance use treatment.  DPT oversees the certification of OTPs and provides guidance to nonprofit organizations and state governmental entities that want to become a SAMHSA-approved accrediting body. Learn more about the accreditation and certification of OTPs and [facility] oversight of OTP accreditation bodies.  Model Guidelines for [employer] With input from [facility], the Federation of [employer] in 2013 adopted a revised version of the federations office-based opioid treatment policies. The Model Policy on DATA 2000 and Treatment of Opioid Addiction in the Medical Office - 2013 (PDF  279 KB) provides model guidelines for use by state medical boards in regulating  office-based opioid treatment.  Holiday Guidance for Opioid Treatment Programs (PDF  203 KB) In response to requests for the upcoming federal holidays and ensuing weekends (December 24th, 25th, and 26th and December 31st, Jan 1st, and Jan 2nd), this letter is to provide guidance regarding requests for unsupervised doses of medication for patients for these dates. View a sample [id number] (PDF  194 KB).  Federal regulation of drugs emerged as early as 27, under a law that addressed only imported drugs. In 1905 the [employer] launched a private, voluntary means of controlling a substantial part of the drug marketplace, a system that remained in place for over a half-century. Drug regulation in FDA has evolved considerably since President Ricardo Para signed the 1906 Pure Food and Drugs Act.  1820 Eleven doctors set up the U.S. Pharmacopeia and record the first list of standard drugs. 1848 Drug Importation Act passed by Congress requires U.S. Customs Service inspection to stop entry of tainted, low quality drugs from overseas. 8116 Dr. Mitchell MICAEL Burrs becomes the chief chemist at the [facility] of [employer] adulteration studies.  1905 The American Medical Association [facility]) begins a voluntary program of drug approval that would last until 1955. In order to advertise in the [facility] and related journals, drug companies must show proof that the drug will treat what they claim. 1906 The original Food and Drug Act is passed by Congress on June 30 and signed by [employer]. The Act outlaws states from buying and selling food, drinks, and drugs that have been mislabeled and tainted. 1911 In U.S. v. Vicci, the [employer] that the [employer] and Drugs Act does not outlaw false medical claims but only false and misleading statements about the ingredients or identity of a drug. 1912 Congress passes the [place] Amendment to overcome the ruling in U.S.  v. Vicci. The Act outlaws labeling medicines with fake medical claims that is meant to trick the buyer. 1930 The name of the Food, Drug, and Insecticide Administration is shortened to Food and Drug Administration (FDA) under an therapist, music. 1933 FDA recommends a total rewrite of the out-of-date 1906 Food and Drugs Act.   1937 Elixir Sulfanilamide, contain the poisonous liquid, diethylene glycol, kills 107 persons, many of whom are children, dramatizing the need to establish drug safety before marketing and to pass the pending food and drug law. 1938 Congress passes [employer], Drug, and Cosmetic (FDC) Act of 1938, which requires that new drugs show safety before selling. This starts a new system of drug regulation. The Act also requires that safe limits be set for unavoidable poisonous matter and allows for factory inspections. The Directv is given power to oversee advertising for all FDAregulated products except prescription drugs. FDA states that sulfanilamide and other dangerous drugs must be given under the direction of a medical expert. This begins the requirement for prescription only (nonnarcotic) drugs (see 1951 [place] amendment). 1941 Nearly 300 deaths and injuries result from the use of sulfathiazole tablets, an antibiotic, tainted with the sedative, phenobarbital. In response, FDA drastically changes manufacturing and quality controls. These changes lead to the development of good manufacturing practices (GMPs). 1948 The [employer] in U.S. v. Floretta that FDA jurisdiction extends to retail stores, thereby allowing FDA to stop illegal sales of drugs by pharmacies including barbiturates and amphetamines. 1950 In [employer]. v. U.S., a U.S. Court of Appeals rules that the directions for use on a drug label must  include the drugs purpose. 1951 Congress passes the [place] Amendment, which  defines the kinds of drugs that cannot be used safely without medical supervision. The amendment limits sale of these drugs to prescription only by a medical professional. All other drugs are to be available without a prescription. 1952 A nationwide investigation by FDA reveals that chloramphenicol, an antibiotic, caused nearly 180 cases of often deadly blood diseases. Two years later FDA engages the [employer] of Hospital Pharmacists, the American Association of Medical Record Librarians, and later the American Medical Association in a voluntary program of drug reaction reporting. 1953 The [employer] Amendment clarifies previous law and requires FDA to give manufacturers written reports of conditions seen during inspections and results of factory samples. 1962 Thalidomide, a new sleeping pill, causes severe birth defects of the arms and legs in thousands of babies born in [place]. The U.S. media reports on how Dr. Cathlean Mort, a FDA medical officer, helped prevent approval and marketing of Thalidomide in the [place] . These reports stirred up public support for stronger drug laws. 3 Congress passes the [employer]. For the first time, these laws require drug makers to prove their drug works before FDA can approve them for sale. The Advisory Committee on Investigational Drugs meets for the first time. This was the first meeting of a committee to advise FDA on product approval and policy on an ongoing basis. 1966 FDA contracts with the [employer] of [employer] to measure the effectiveness of 4,000 marketed drugs approved on the basis of safety alone between 519 268 1948 and 1961-01-17. The Fair Packaging and Labeling Act requires all consumer products, in interstate commerce, to be honestly and informatively labeled. 18-Jan-1967 FDA forms the Drug Efficacy Study Implementation (DESI) to carry out recommendations of the [employer] of the effectiveness of drugs first sold between [place] and 1963/01/301-Jan-1971 FDA requires the first patient package insert, medicines must come with information for the patient about risks and benefits. 1972 Over-the-Counter Drug Review begins to enhance the safety, effectiveness and appropriate labeling of drugs sold without prescription. 1973 The U.S. Supreme Court upholds the [place] drug effectiveness law and approves FDAs action to control entire classes of products. 1982 FDA issues Tamper-resistant Packaging Regulations to prevent poisonings such as deaths from cyanide placed in Tylenol  capsules. Congress passes the [employer] in 01/17/82, making it a crime to tamper with packaged consumer products. 01-18-83 Drug Price Advertising Account Planner Act (Hatch-Waxman Act) increases the availability of less costly generic drugs by allowing FDA to approve applications for generic versions of brand-name drugs without repeating the research that proved the safety and effectiveness of the brand-name drugs. The Act also allowed brand-name companies to apply for up to five years additional patent protection for the new medicines they developed to make up for time lost while their products were going through FDA's approval process. 1989 The FDA issued guidelines asking drug makers to decide if a drug is likely to have usefulness in elderly people and to include elderly people in studies when applicable. 1991 In 01-18-1980, the FDA and the Department of Health and Human Services published a policy on protecting people in research. In 1990/01/17, this policy is adopted by more than a dozen federal agencies involved in human subject research and becomes known as the Common Rule. 4 1993 FDA launches MedWatch, a system designed to collect reports from health professionals on problems with drugs and other medical products. FDA  issues guidelines for measuring  gender differences in responses to medication. Drug companies are encouraged to include patients of both sexes in their research of drugs and to study any gender-specific effects. 1995 FDA declares cigarettes to be drug delivery devices. Limits are issued on marketing and sales to reduce smoking by young people. 1998 FDA introduces the Adverse Event Reporting System (AERS), a computerized database designed to store and study safety reports on already marketed drugs.  The Demographic Rule requires that a marketing application review data on safety and effectiveness by age, gender, and race. The Pediatric Rule requires drug makers of selected new and existing drugs to conduct studies on drug safety and effectiveness in children. 1999 Creation of the Drug Facts Label for OTC drug products. The law requires all overthe-counter drug labels to have information in a standard format. These drug facts labels are designed to give the user easy-to-find information. 2000 The U. S. [employer], upholds an earlier decision from The [employer] and Drug Administration v. [employer]. et al. and rules 5-4 that FDA does not have authority to regulate tobacco as a drug. 2002 The Best Pharmaceuticals for Children Act, in exchange for studying the drug in children, the drug maker gets six months of selling their product without competition. 2003 The Pediatric Research Equity Act gives FDA the right to ask drug companies to study the effectiveness of new drugs in children. 2004 FDA advises medical professionals to limit the use of a pain reliever called Cox-2, a nonsteroidal anti-inflammatory drug (NSAIDs). Studies had shown that long-term use raised chances of heart attacks and strokes. The warning is also added to the over-thecounter NSAIDs Drug Facts label. Medicines used in hospitals must have a bar code to prevent patients from receiving the wrong medicine. 5 2005 The Drug Safety  Board is formed, consisting of FDA staff and representatives from the [employer] of [address] and the [employer]. The Board advises the Director, Center for Drug Evaluation and Research, FDA, on drug safety issues and works with the agency in sharing safety information to health professionals and patients.  The [place]  Food and Drug Administration (FDA) was first created to enforce the Pure Food and Drug Act of 1906. In this capacity, the FDA is charged with protecting the health of the US  public, to ensure the quality of its food, medicine, and cosmetics. Before this time, the [place]  government had no formal oversight of these products and left issues of quality and purity to the individual manufactures, or at times, individual states.    Review:  Stop ACT. (The Strengthen Opioid Misuse Prevention (STOP) Act of 2017). GENERAL ASSEMBLY OF [place]  SESSION 2017 SESSION LAW 2017-74 HOUSE BILL 243  PMP mandatory The dispenser shall report: (1) The dispenser's DEA number. (2) The name of the patient for whom the controlled substance is being dispensed, and the patient's: a. Full address, including city, state, and zip code, b. Telephone number, and c. Date of birth. (3) The date the prescription was written. (4) The date the prescription was filled. (5) The prescription number. (6) Whether the prescription is new or a refill. (7) Metric quantity of the dispensed drug. (8) Estimated days of supply of dispensed drug, if provided to the dispenser. (9) National Drug Code of dispensed drug. (10) Prescriber's DEA number. (11) Method of payment for the prescription.  No paper prescriptions  Duration of scripts Acute vs Chronic prescribing  2016 CDC Guidelines for prescribing Opioids for Chronic Pain. (  Updated in 2022.) Medical Board  Laws:  Prescription Laws Drug laws, rules, and regulations are constantly changing. Any attempt to summarize them would  quickly become outdated. Because of that, the Board encourages practitioners who seek guidance on prescribing procedures to refer to the sources listed below in addition to the Boards position statements, rules and Medical Practice Act.  [place]  Board of Pharmacy (NCBOP) (which offers the states pharmacy laws and rules, and links to the Code of Federal Regulations) [employer] Site: www.ncbop.org  [place]  General Statutes General Web Site: politicalpool.cz See: [place]  Food, Drug, and Cosmetic Act: [id number] & 106-134 See: [place]  Pharmacy Practice Act, Article 4A: [phone] See: [place]  Controlled Substances Act, Article 5: 90-86 & 90-113.8 See: Use of controlled substances to render one mentally incapacitated or physically helpless: [facility] [facility]. Code, Title 21, Food & Drugs www.deadiversion.usdoj.gov Controlled Substances Schedules www.deadiversion.usdoj.gov Drug Warehouse Manager - www.deadiversion.usdoj.gov 42 CFR  8.12 - Federal opioid treatment standards.   Effective September 11, 2015, prior approval will be required for opioid analgesic doses for [facility]. Medicaid and [place] [facility]) beneficiaries which:  Exceed 120 mg of morphine  equivalents (MME) per day  Are greater than a 14-day supply of any opioid, or,  Are non-preferred opioid products on the [place] Medicaid Preferred Drug List (PDL)  FEDERAL 42 CFR  8.12 - Federal opioid treatment standards. Title II of the Comprehensive Drug Abuse Prevention and Control Act of 1970, commonly known as the Controlled Substance Act (CSA) Title 21 [place]  Code ([place]) Controlled Substances Act.   Reference:   ______________________________________________________________________       ______________________________________________________________________    Medication Rules  Purpose: To inform patients, and their family members, of our  medication rules and regulations.  Applies to: All patients receiving prescriptions from our practice (written or electronic).  Pharmacy of record: This is the pharmacy where your electronic prescriptions will be sent. Make sure we have the correct one.  Electronic prescriptions: In compliance with the   Strengthen Opioid Misuse Prevention (STOP) Act of 2017 (Session Law 2017-74/H243), effective January 15, 2018, all controlled substances must be electronically prescribed. Written prescriptions, faxing, or calling prescriptions to a pharmacy will no longer be done.  Prescription refills: These will be provided only during in-person appointments. No medications will be renewed without a face-to-face evaluation with your provider. Applies to all prescriptions.  NOTE: The following applies primarily to controlled substances (Opioid* Pain Medications).   Type of encounter (visit): For patients receiving controlled substances, face-to-face visits are required. (Not an option and not up to the patient.)  Patient's Responsibilities: Pain Pills: Bring all pain pills to every appointment (except for procedure appointments). Pill counts are required.  Pill Bottles: Bring pills in original pharmacy bottle. Bring bottle, even if empty. Always bring the bottle of the most recent fill.  Medication refills: You are responsible for knowing and keeping track of what medications you are taking and when is it that you will need a refill. The day before your appointment: write a list of all prescriptions that need to be refilled. The day of the appointment: give the list to the admitting nurse. Prescriptions will be written only during appointments. No prescriptions will be written on procedure days. If you forget a medication: it will not be Called in, Faxed, or electronically sent. You will need to get another appointment to get these prescribed. No early refills. Do not call asking to have  your  prescription filled early. Partial  or short prescriptions: Occasionally your pharmacy may not have enough pills to fill your prescription.  NEVER ACCEPT a partial fill or a prescription that is short of the total amount of pills that you were prescribed.  With controlled substances the law allows 72 hours for the pharmacy to complete the prescription.  If the prescription is not completed within 72 hours, the pharmacist will require a new prescription to be written. This means that you will be short on your medicine and we WILL NOT send another prescription to complete your original prescription.  Instead, request the pharmacy to send a carrier to a nearby branch to get enough medication to provide you with your full prescription. Prescription Accuracy: You are responsible for carefully inspecting your prescriptions before leaving our office. Have the discharge nurse carefully go over each prescription with you, before taking them home. Make sure that your name is accurately spelled, that your address is correct. Check the name and dose of your medication to make sure it is accurate. Check the number of pills, and the written instructions to make sure they are clear and accurate. Make sure that you are given enough medication to last until your next medication refill appointment. Taking Medication: Take medication as prescribed. When it comes to controlled substances, taking less pills or less frequently than prescribed is permitted and encouraged. Never take more pills than instructed. Never take the medication more frequently than prescribed.  Inform other Doctors: Always inform, all of your healthcare providers, of all the medications you take. Pain Medication from other Providers: You are not allowed to accept any additional pain medication from any other Doctor or Healthcare provider. There are two exceptions to this rule. (see below) In the event that you require additional pain medication, you  are responsible for notifying us , as stated below. Cough Medicine: Often these contain an opioid, such as codeine or hydrocodone . Never accept or take cough medicine containing these opioids if you are already taking an opioid* medication. The combination may cause respiratory failure and death. Medication Agreement: You are responsible for carefully reading and following our Medication Agreement. This must be signed before receiving any prescriptions from our practice. Safely store a copy of your signed Agreement. Violations to the Agreement will result in no further prescriptions. (Additional copies of our Medication Agreement are available upon request.) Laws, Rules, & Regulations: All patients are expected to follow all [address] and [employer], Rules, [place] [employer]. Ignorance of the Laws does not constitute a valid excuse.  Illegal drugs and Controlled Substances: The use of illegal substances (including, but not limited to marijuana and its derivatives) and/or the illegal use of any controlled substances is strictly prohibited. Violation of this rule may result in the immediate and permanent discontinuation of any and all prescriptions being written by our practice. The use of any illegal substances is prohibited. Adopted CDC guidelines & recommendations: Target dosing levels will be at or below 60 MME/day. Use of benzodiazepines** is not recommended. Urine Drug testing: Patients taking controlled substances will be required to provide a urine sample upon request. Do not void before coming to your medication management appointments. Hold emptying your bladder until you are admitted. The admitting nurse will inform you if a sample is required. Our practice reserves the right to call you at any time to provide a sample. Once receiving the call, you have 24 hours to comply with request. Not providing a sample upon request may result in  termination of medication therapy.  Exceptions: There are only  two exceptions to the rule of not receiving pain medications from other Healthcare Providers. Exception #1 (Emergencies): In the event of an emergency (i.e.: accident requiring emergency care), you are allowed to receive additional pain medication. However, you are responsible for: As soon as you are able, call our office [phone], at any time of the day or night, and leave a message stating your name, the date and nature of the emergency, and the name and dose of the medication prescribed. In the event that your call is answered by a member of our staff, make sure to document and save the date, time, and the name of the person that took your information.  Exception #2 (Planned Surgery): In the event that you are scheduled by another doctor or dentist to have any type of surgery or procedure, you are allowed (for a period no longer than 30 days), to receive additional pain medication, for the acute post-op pain. However, in this case, you are responsible for picking up a copy of our Post-op Pain Management for Surgeons handout, and giving it to your surgeon or dentist. This document is available at our office, and does not require an appointment to obtain it. Simply go to our office during business hours (Monday-Thursday from 8:00 AM to 4:00 PM) (Friday 8:00 AM to 12:00 Noon) or if you have a scheduled appointment with us , prior to your surgery, and ask for it by name. In addition, you are responsible for: calling our office [phone], at any time of the day or night, and leaving a message stating your name, name of your surgeon, type of surgery, and date of procedure or surgery. Failure to comply with your responsibilities may result in termination of therapy involving the controlled substances.  Consequences:  Non-compliance with the above rules may result in permanent discontinuation of medication prescription therapy. All patients receiving any type of controlled substance is expected to  comply with the above patient responsibilities. Not doing so may result in permanent discontinuation of medication prescription therapy. Medication Agreement Violation. Following the above rules, including your responsibilities will help you in avoiding a Medication Agreement Violation (Breaking your Pain Medication Contract).  *Opioid medications include: morphine , codeine, oxycodone , oxymorphone, hydrocodone , hydromorphone , meperidine , tramadol , tapentadol, buprenorphine , fentanyl , methadone. **Benzodiazepine medications include: diazepam  (Valium ), alprazolam (Xanax), clonazepam (Klonopine), lorazepam  (Ativan ), clorazepate (Tranxene), chlordiazepoxide (Librium), estazolam (Prosom), oxazepam (Serax), temazepam (Restoril), triazolam (Halcion) (Last updated: 11/07/2022) ______________________________________________________________________     ______________________________________________________________________    Medication Recommendations and Reminders  Applies to: All patients receiving prescriptions (written and/or electronic).  Medication Rules & Regulations: You are responsible for reading, knowing, and following our Medication Rules document. These exist for your safety and that of others. They are not flexible and neither are we. Dismissing or ignoring them is an act of non-compliance that may result in complete and irreversible termination of such medication therapy. For safety reasons, non-compliance will not be tolerated. As with the U[place]S. fundamental legal principle of ignorance of the law is no defense, we will accept no excuses for not having read and knowing the content of documents provided to you by our practice.  Pharmacy of record:  Definition: This is the pharmacy where your electronic prescriptions will be sent.  We do not endorse any particular pharmacy. It is up to you and your insurance to decide what pharmacy to use.  We do not restrict you in your choice of  pharmacy. However, once we  write for your prescriptions, we will NOT be re-sending more prescriptions to fix restricted supply problems created by your pharmacy, or your insurance.  The pharmacy listed in the electronic medical record should be the one where you want electronic prescriptions to be sent. If you choose to change pharmacy, simply notify our nursing staff. Changes will be made only during your regular appointments and not over the phone.  Recommendations: Keep all of your pain medications in a safe place, under lock and key, even if you live alone. We will NOT replace lost, stolen, or damaged medication. We do not accept Police Reports as proof of medications having been stolen. After you fill your prescription, take 1 week's worth of pills and put them away in a safe place. You should keep a separate, properly labeled bottle for this purpose. The remainder should be kept in the original bottle. Use this as your primary supply, until it runs out. Once it's gone, then you know that you have 1 week's worth of medicine, and it is time to come in for a prescription refill. If you do this correctly, it is unlikely that you will ever run out of medicine. To make sure that the above recommendation works, it is very important that you make sure your medication refill appointments are scheduled at least 1 week before you run out of medicine. To do this in an effective manner, make sure that you do not leave the office without scheduling your next medication management appointment. Always ask the nursing staff to show you in your prescription , when your medication will be running out. Then arrange for the receptionist to get you a return appointment, at least 7 days before you run out of medicine. Do not wait until you have 1 or 2 pills left, to come in. This is very poor planning and does not take into consideration that we may need to cancel appointments due to bad weather, sickness, or emergencies  affecting our staff. DO NOT ACCEPT A Partial Fill: If for any reason your pharmacy does not have enough pills/tablets to completely fill or refill your prescription, do not allow for a partial fill. The law allows the pharmacy to complete that prescription within 72 hours, without requiring a new prescription. If they do not fill the rest of your prescription within those 72 hours, you will need a separate prescription to fill the remaining amount, which we will NOT provide. If the reason for the partial fill is your insurance, you will need to talk to the pharmacist about payment alternatives for the remaining tablets, but again, DO NOT ACCEPT A PARTIAL FILL, unless you can trust your pharmacist to obtain the remainder of the pills within 72 hours.  Prescription refills and/or changes in medication(s):  Prescription refills, and/or changes in dose or medication, will be conducted only during scheduled medication management appointments. (Applies to both, written and electronic prescriptions.) No refills on procedure days. No medication will be changed or started on procedure days. No changes, adjustments, and/or refills will be conducted on a procedure day. Doing so will interfere with the diagnostic portion of the procedure. No phone refills. No medications will be called into the pharmacy. No Fax refills. No weekend refills. No Holliday refills. No after hours refills.  Remember:  Business hours are:  Monday to Thursday 8:00 AM to 4:00 PM Provider's Schedule: Eric Como, MD - Appointments are:  Medication management: Monday and Wednesday 8:00 AM to 4:00 PM Procedure day: Tuesday and Thursday  7:30 AM to 4:00 PM Wallie Sherry, MD - Appointments are:  Medication management: Tuesday and Thursday 8:00 AM to 4:00 PM Procedure day: Monday and Wednesday 7:30 AM to 4:00 PM (Last update: 11/07/2021) ______________________________________________________________________      ______________________________________________________________________    National Pain Medication Shortage  The [place] is experiencing worsening drug shortages. These have had a negative widespread effect on patient care and treatment. Not expected to improve any time soon. Predicted to last past 2029.   Drug shortage list (generic names) Oxycodone  IR Oxycodone /APAP Oxymorphone IR Hydromorphone  Hydrocodone /APAP Morphine   Where is the problem?  Manufacturing and supply level.  Will this shortage affect you?  Only if you take any of the above pain medications.  How? You may be unable to fill your prescription.  Your pharmacist may offer a partial fill of your prescription. (Warning: Do not accept partial fills.) Prescriptions partially filled cannot be transferred to another pharmacy. Read our Medication Rules and Regulation. Depending on how much medicine you are dependent on, you may experience withdrawals when unable to get the medication.  Recommendations: Consider ending your dependence on opioid pain medications. Ask your pain specialist to assist you with the process. Consider switching to a medication currently not in shortage, such as Buprenorphine . Talk to your pain specialist about this option. Consider decreasing your pain medication requirements by managing tolerance thru Drug Holidays. This may help minimize withdrawals, should you run out of medicine. Control your pain thru the use of non-pharmacological interventional therapies.   Your prescriber: Prescribers cannot be blamed for shortages. Medication manufacturing and supply issues cannot be fixed by the prescriber.   NOTE: The prescriber is not responsible for supplying the medication, or solving supply issues. Work with your pharmacist to solve it. The patient is responsible for the decision to take or continue taking the medication and for identifying and securing a legal supply source. By law, supplying the  medication is the job and responsibility of the pharmacy. The prescriber is responsible for the evaluation, monitoring, and prescribing of these medications.   Prescribers will NOT: Re-issue prescriptions that have been partially filled. Re-issue prescriptions already sent to a pharmacy.  Re-send prescriptions to a different pharmacy because yours did not have your medication. Ask pharmacist to order more medicine or transfer the prescription to another pharmacy. (Read below.)  New 2023 regulation: September 15, 2021 Revised Regulation Allows DEA-Registered Pharmacies to Transfer Electronic Prescriptions at a Patients Request DEA Headquarters Division - Public Information Office Patients now have the ability to request their electronic prescription be transferred to another pharmacy without having to go back to their practitioner to initiate the request. This revised regulation went into effect on Monday, September 11, 2021.     At a patients request, a DEA-registered retail pharmacy can now transfer an electronic prescription for a controlled substance (schedules II-V) to another DEA-registered retail pharmacy. Prior to this change, patients would have to go through their practitioner to cancel their prescription and have it re-issued to a different pharmacy. The process was taxing and time consuming for both patients and practitioners.    The Drug Enforcement Administration [facility]) published its intent to revise the process for transferring electronic prescriptions on December 04, 2019.  The final rule was published in the federal register on August 10, 2021 and went into effect 30 days later.  Under the final rule, a prescription can only be transferred once between pharmacies, and only if allowed under existing state or other applicable law. The prescription  must remain in its electronic form; may not be altered in any way; and the transfer must be communicated directly between two licensed  pharmacists. Its important to note, any authorized refills transfer with the original prescription, which means the entire prescription will be filled at the same pharmacy.  Reference: hugehand.is [facility] website announcement)  Cheapwipes.at.pdf [employer])   [employer] / Vol. 88, No. 143 / Thursday, August 10, 2021 / Rules and Regulations DEPARTMENT OF JUSTICE  Drug Enforcement Administration  21 CFR Part 1306  [Docket No. [id number]]  RIN [id number] Transfer of Electronic Prescriptions for Schedules II-V Controlled Substances Between Pharmacies for Initial Filling  ______________________________________________________________________       ______________________________________________________________________    Transfer of Pain Medication between Pharmacies  Re: 2023 DEA Clarification on existing regulation  Published on DEA Website: September 15, 2021  Title: Revised Regulation Allows DEA-Registered Pharmacies to Electrical Engineer Prescriptions at a Patients Request DEA Headquarters Division - [employer]  Patients now have the ability to request their electronic prescription be transferred to another pharmacy without having to go back to their practitioner to initiate the request. This revised regulation went into effect on Monday, September 11, 2021.     At a patients request, a DEA-registered retail pharmacy can now transfer an electronic prescription for a controlled substance (schedules II-V) to another DEA-registered retail pharmacy. Prior to this change, patients would have to go through their practitioner to cancel their prescription and have it re-issued to a different pharmacy. The process was taxing and time consuming for both patients and practitioners.    The Drug  Enforcement Administration [facility]) published its intent to revise the process for transferring electronic prescriptions on December 04, 2019.  The final rule was published in the federal register on August 10, 2021 and went into effect 30 days later.  Under the final rule, a prescription can only be transferred once between pharmacies, and only if allowed under existing state or other applicable law. The prescription must remain in its electronic form; may not be altered in any way; and the transfer must be communicated directly between two licensed pharmacists. Its important to note, any authorized refills transfer with the original prescription, which means the entire prescription will be filled at the same pharmacy.    REFERENCES: 1. DEA website announcement hugehand.is  2. Department of Justice website  Cheapwipes.at.pdf  3. DEPARTMENT OF JUSTICE Drug Enforcement Administration 21 CFR Part 1306 [Docket No. [id number]] RIN [id number]-AB64 Transfer of Electronic Prescriptions for Schedules II-V Controlled Substances Between Pharmacies for Initial Filling  ______________________________________________________________________       ______________________________________________________________________    Medication Transfer   Notification You are currently compliant and stable on your pain medication regimen. This regimen will be transferred today to your Primary Care Provider (PCP). You will be provided with enough prescriptions to last for 90 days. After that, your prescriptions will need to be taken over by your PCP.  Recommendation Immediately contact your primary care provider to secure an appointment for evaluation before this period is over. Do not wait until the last month to contact them.   Clarification The transfer of your  medication regimen does not mean that you are being discharged from our clinic. We will remain available to you for any consultation or interventional therapies you may need.   Alternative Should you decide not to continue taking these medication and would like assistance in permanently stopping them, please let us  know so that we can  design a slow tapering down of your regimen.  Reason Our primary responsibility to provide specialized interventional pain management therapies otherwise not available to the community. We have in the past assisted primary care providers with reviewing and adjusting pain medication management therapies, however, we have been transparent to all patients and referring providers that it is not our intention to permanently take over this type of therapy. Transfer of this portion of your care will assist us  in freeing time to assist others in need of our specialty services.   ______________________________________________________________________      ______________________________________________________________________    WARNING: CBD (cannabidiol) & Delta (Delta-8 tetrahydrocannabinol) products.   Applicable to:  All individuals currently taking or considering taking CBD (cannabidiol) and, more important, all patients taking opioid analgesic controlled substances (pain medication). (Example: oxycodone ; oxymorphone; hydrocodone ; hydromorphone ; morphine ; methadone; tramadol ; tapentadol; fentanyl ; buprenorphine ; butorphanol; dextromethorphan; meperidine ; codeine; etc.)  Introduction:  Recently there has been a drive towards the use of natural products for the treatment of different conditions, including pain anxiety and sleep disorders. Marijuana and hemp are two varieties of the cannabis genus plants. Marijuana and its derivatives are illegal, while hemp and its derivatives are not. Cannabidiol (CBD) and tetrahydrocannabinol (THC), are two natural compounds found in  plants of the Cannabis genus. They can both be extracted from hemp or marijuana. Both compounds interact with your bodys endocannabinoid system in very different ways. CBD is associated with pain relief (analgesia) while THC is associated with the psychoactive effects (the high) obtained from the use of marijuana products. There are two main types of THC: Delta-9, which comes from the marijuana plant and it is illegal, and Delta-8, which comes from the hemp plant, and it is legal. (Both, Delta-9-THC and Delta-8-THC are psychoactive and give you the high.)   Legality:  Marijuana and its derivatives: illegal Hemp and its derivatives: Legal (State dependent) UPDATE: (03/03/2021) The Drug Enforcement Agency (DEA) issued a letter stating that delta cannabinoids, including Delta-8-THCO and Delta-9-THCO, synthetically derived from hemp do not qualify as hemp and will be viewed as Schedule I drugs. (Schedule I drugs, substances, or chemicals are defined as drugs with no currently accepted medical use and a high potential for abuse. Some examples of Schedule I drugs are: heroin, lysergic acid diethylamide (LSD), marijuana (cannabis), 3,4-methylenedioxymethamphetamine (ecstasy), methaqualone, and peyote.) (cuetune.com.ee)  Legal status of CBD in [place]:  Conditionally Legal  Reference: FDA Regulation of Cannabis and Cannabis-Derived Products, Including Cannabidiol (CBD) - oemdeals.dk  Warning:  CBD is not FDA approved and has not undergo the same manufacturing controls as prescription drugs.  This means that the purity and safety of available CBD may be questionable. Most of the time, despite manufacturer's claims, it is contaminated with THC (delta-9-tetrahydrocannabinol - the chemical in marijuana responsible for the HIGH).  When this is the case, the [facility] contaminant will trigger  a positive urine drug screen (UDS) test for Marijuana (carboxy-THC).   The FDA recently put out a warning about 5 things that everyone should be aware of regarding Delta-8 THC: Delta-8 THC products have not been evaluated or approved by the FDA for safe use and may be marketed in ways that put the public health at risk. The FDA has received adverse event reports involving delta-8 THC-containing products. Delta-8 THC has psychoactive and intoxicating effects. Delta-8 THC manufacturing often involve use of potentially harmful chemicals to create the concentrations of delta-8 THC claimed in the marketplace. The final delta-8 THC product may have potentially harmful by-products (contaminants) due  to the chemicals used in the process. Manufacturing of delta-8 THC products may occur in uncontrolled or unsanitary settings, which may lead to the presence of unsafe contaminants or other potentially harmful substances. Delta-8 THC products should be kept out of the reach of children and pets.  NOTE: Because a positive UDS for any illicit substance is a violation of our medication agreement, your opioid analgesics (pain medicine) may be permanently discontinued.  MORE ABOUT CBD  General Information: CBD was discovered in [birth year] and it is a derivative of the cannabis sativa genus plants (Marijuana and Hemp). It is one of the 113 identified substances found in Marijuana. It accounts for up to 40% of the plant's extract. As of 2018, preliminary clinical studies on CBD included research for the treatment of anxiety, movement disorders, and pain. CBD is available and consumed in multiple forms, including inhalation of smoke or vapor, as an aerosol spray, and by mouth. It may be supplied as an oil containing CBD, capsules, dried cannabis, or as a liquid solution. CBD is thought not to be as psychoactive as THC (delta-9-tetrahydrocannabinol - the chemical in marijuana responsible for the HIGH). Studies suggest that CBD  may interact with different biological target receptors in the body, including cannabinoid and other neurotransmitter receptors. As of 2018 the mechanism of action for its biological effects has not been determined.  Side-effects  Adverse reactions: Dry mouth, diarrhea, decreased appetite, fatigue, drowsiness, malaise, weakness, sleep disturbances, and others.  Drug interactions:  CBD may interact with medications such as blood-thinners. CBD causes drowsiness on its own and it will increase drowsiness caused by other medications, including antihistamines (such as Benadryl ), benzodiazepines (Xanax, Ativan , Valium ), antipsychotics, antidepressants, opioids, alcohol and supplements such as kava, melatonin and St. John's Wort.  Other drug interactions: Brivaracetam (Briviact); Caffeine ; Carbamazepine (Tegretol); Citalopram (Celexa); Clobazam (Onfi); Eslicarbazepine (Aptiom); Everolimus (Zostress); Lithium; Methadone (Dolophine); Rufinamide (Banzel); Sedative medications (CNS depressants); Sirolimus (Rapamune); Stiripentol (Diacomit); Tacrolimus (Prograf); Tamoxifen ; Soltamox); Topiramate (Topamax); Valproate; Warfarin (Coumadin); Zonisamide. (Last update: 12/25/2021) ______________________________________________________________________     ______________________________________________________________________    Muscle Spasms & Cramps  Cause(s):  Most common - vitamin and/or electrolyte (calcium, potassium, sodium, etc.) deficiencies. Post procedure - steroids (injected, oral, or inhaled) can make your kidneys excrete (loose) electrolytes. Most of the time this will not cause any symptoms however, if you happen to be borderline low on your electrolytes, it may temporarily triggering cramps & spasms.  Possible triggers: Sweating - causes loss of electrolytes thru the skin. Steroids - causes loss of electrolytes thru the urine.  Treatment: (over-the-counter)  Gatorade (or any other  electrolyte-replenishing drink) - Take 1, 8 oz glass with each meal (3 times a day). Mechanism of action: Replenishes lost electrolytes. Magnesium 400 to 500 mg - Take 1 tablet twice a day (one with breakfast and one at bedtime). If you have kidney disease talk to your primary care physician before taking any Magnesium. Mechanism of action: Magnesium is a natural muscle relaxant. Tonic Water with quinine - Take 1, 8 oz glass before bedtime.  Mechanism of action: Quinine is used to treat spasms.  Last Update: 07/26/2022  ______________________________________________________________________     "

## 2024-01-06 NOTE — Progress Notes (Signed)
 Nursing Pain Medication Assessment:  Safety precautions to be maintained throughout the outpatient stay will include: orient to surroundings, keep bed in low position, maintain call bell within reach at all times, provide assistance with transfer out of bed and ambulation.  Medication Inspection Compliance: Pill count conducted under aseptic conditions, in front of the patient. Neither the pills nor the bottle was removed from the patient's sight at any time. Once count was completed pills were immediately returned to the patient in their original bottle.  Medication: Oxycodone /APAP Pill/Patch Count: 16 of 60 pills/patches remain Pill/Patch Appearance: Markings consistent with prescribed medication Bottle Appearance: Standard pharmacy container. Clearly labeled. Filled Date: 63 / 02 / 2025 Last Medication intake:  TodaySafety precautions to be maintained throughout the outpatient stay will include: orient to surroundings, keep bed in low position, maintain call bell within reach at all times, provide assistance with transfer out of bed and ambulation.   States has 8-10 additional pills at home in pill box.  Also has Additional Oxycodone  after recent hernia repair.
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
## 2024-01-11 LAB — DELTA-8 / DELTA-9 THC MTB,MS,U
Carboxy-Delta-8-THC: NOT DETECTED ng/mL
Carboxy-Delta-9-THC: 202.6 ng/mL

## 2024-01-20 ENCOUNTER — Other Ambulatory Visit: Payer: Self-pay

## 2024-01-30 ENCOUNTER — Other Ambulatory Visit: Payer: Self-pay

## 2024-01-30 MED ORDER — PREDNISONE 20 MG PO TABS
ORAL_TABLET | ORAL | 0 refills | Status: AC
  Filled 2024-01-30: qty 10, 5d supply, fill #0

## 2024-01-30 MED ORDER — EPINEPHRINE 0.3 MG/0.3ML IJ SOAJ
0.3000 mg | Freq: Every day | INTRAMUSCULAR | 0 refills | Status: AC | PRN
  Filled 2024-01-30: qty 2, 30d supply, fill #0

## 2024-02-03 ENCOUNTER — Telehealth: Payer: Self-pay | Admitting: Urology

## 2024-02-03 NOTE — Telephone Encounter (Signed)
 Patient sent mychart message requesting refill for testosterone  and states he will need to have test levels checked. Please advise when he should be scheduled.

## 2024-02-04 ENCOUNTER — Ambulatory Visit
Attending: Student in an Organized Health Care Education/Training Program | Admitting: Student in an Organized Health Care Education/Training Program

## 2024-02-04 ENCOUNTER — Encounter: Payer: Self-pay | Admitting: Student in an Organized Health Care Education/Training Program

## 2024-02-04 ENCOUNTER — Other Ambulatory Visit: Payer: Self-pay

## 2024-02-04 VITALS — BP 136/91 | HR 100 | Temp 98.8°F | Resp 17 | Ht 70.0 in | Wt 225.0 lb

## 2024-02-04 DIAGNOSIS — G5792 Unspecified mononeuropathy of left lower limb: Secondary | ICD-10-CM | POA: Insufficient documentation

## 2024-02-04 DIAGNOSIS — G5782 Other specified mononeuropathies of left lower limb: Secondary | ICD-10-CM | POA: Insufficient documentation

## 2024-02-04 DIAGNOSIS — M5414 Radiculopathy, thoracic region: Secondary | ICD-10-CM | POA: Insufficient documentation

## 2024-02-04 DIAGNOSIS — G8929 Other chronic pain: Secondary | ICD-10-CM | POA: Diagnosis present

## 2024-02-04 DIAGNOSIS — M546 Pain in thoracic spine: Secondary | ICD-10-CM | POA: Diagnosis not present

## 2024-02-04 DIAGNOSIS — G894 Chronic pain syndrome: Secondary | ICD-10-CM | POA: Insufficient documentation

## 2024-02-04 MED ORDER — OXYCODONE-ACETAMINOPHEN 10-325 MG PO TABS
1.0000 | ORAL_TABLET | Freq: Two times a day (BID) | ORAL | 0 refills | Status: AC | PRN
  Filled 2024-02-19: qty 60, 30d supply, fill #0
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
## 2024-02-04 NOTE — Progress Notes (Signed)
 Nursing Pain Medication Assessment:  Safety precautions to be maintained throughout the outpatient stay will include: orient to surroundings, keep bed in low position, maintain call bell within reach at all times, provide assistance with transfer out of bed and ambulation.  Medication Inspection Compliance: Mr. Mcglone did not comply with our request to bring his pills to be counted. He was reminded that bringing the medication bottles, even when empty, is a requirement.  Medication: None brought in. Pill/Patch Count: None available to be counted. Bottle Appearance: No container available. Did not bring bottle(s) to appointment. Filled Date: N/A Last Medication intake:  Yesterday  Pt states he has at least 1 week left of Oxycodone ROBBIN

## 2024-02-04 NOTE — Patient Instructions (Signed)
Epidural Steroid Injection ?Patient Information ? ?Description: The epidural space surrounds the nerves as they exit the spinal cord.  In some patients, the nerves can be compressed and inflamed by a bulging disc or a tight spinal canal (spinal stenosis).  By injecting steroids into the epidural space, we can bring irritated nerves into direct contact with a potentially helpful medication.  These steroids act directly on the irritated nerves and can reduce swelling and inflammation which often leads to decreased pain.  Epidural steroids may be injected anywhere along the spine and from the neck to the low back depending upon the location of your pain. ?  After numbing the skin with local anesthetic (like Novocaine), a small needle is passed into the epidural space slowly.  You may experience a sensation of pressure while this is being done.  The entire block usually last less than 10 minutes. ? ?Conditions which may be treated by epidural steroids: ? ?Low back and leg pain ?Neck and arm pain ?Spinal stenosis ?Post-laminectomy syndrome ?Herpes zoster (shingles) pain ?Pain from compression fractures ? ?Preparation for the injection: ? ?Do not eat any solid food or dairy products within 8 hours of your appointment.  ?You may drink clear liquids up to 3 hours before appointment.  Clear liquids include water, black coffee, juice or soda.  No milk or cream please. ?You may take your regular medication, including pain medications, with a sip of water before your appointment  Diabetics should hold regular insulin (if taken separately) and take 1/2 normal NPH dos the morning of the procedure.  Carry some sugar containing items with you to your appointment. ?A driver must accompany you and be prepared to drive you home after your procedure.  ?Bring all your current medications with your. ?An IV may be inserted and sedation may be given at the discretion of the physician.   ?A blood pressure cuff, EKG and other monitors will  often be applied during the procedure.  Some patients may need to have extra oxygen administered for a short period. ?You will be asked to provide medical information, including your allergies, prior to the procedure.  We must know immediately if you are taking blood thinners (like Coumadin/Warfarin)  Or if you are allergic to IV iodine contrast (dye). We must know if you could possible be pregnant. ? ?Possible side-effects: ?Bleeding from needle site ?Infection (rare, may require surgery) ?Nerve injury (rare) ?Numbness & tingling (temporary) ?Difficulty urinating (rare, temporary) ?Spinal headache ( a headache worse with upright posture) ?Light -headedness (temporary) ?Pain at injection site (several days) ?Decreased blood pressure (temporary) ?Weakness in arm/leg (temporary) ?Pressure sensation in back/neck (temporary) ? ?Call if you experience: ?Fever/chills associated with headache or increased back/neck pain. ?Headache worsened by an upright position. ?New onset weakness or numbness of an extremity below the injection site ?Hives or difficulty breathing (go to the emergency room) ?Inflammation or drainage at the infection site ?Severe back/neck pain ?Any new symptoms which are concerning to you ? ?Please note: ? ?Although the local anesthetic injected can often make your back or neck feel good for several hours after the injection, the pain will likely return.  It takes 3-7 days for steroids to work in the epidural space.  You may not notice any pain relief for at least that one week. ? ?If effective, we will often do a series of three injections spaced 3-6 weeks apart to maximally decrease your pain.  After the initial series, we generally will wait several months before   considering a repeat injection of the same type. ? ?If you have any questions, please call [phone] ?[facility] ?

## 2024-02-04 NOTE — Progress Notes (Signed)
 Safety precautions to be maintained throughout the outpatient stay will include: orient to surroundings, keep bed in low position, maintain call bell within reach at all times, provide assistance with transfer out of bed and ambulation.

## 2024-02-05 ENCOUNTER — Other Ambulatory Visit: Payer: Self-pay | Admitting: Urology

## 2024-02-05 DIAGNOSIS — N529 Male erectile dysfunction, unspecified: Secondary | ICD-10-CM

## 2024-02-05 DIAGNOSIS — E291 Testicular hypofunction: Secondary | ICD-10-CM

## 2024-02-05 MED ORDER — TESTOSTERONE CYPIONATE 200 MG/ML IM SOLN
200.0000 mg | INTRAMUSCULAR | 0 refills | Status: AC
  Filled 2024-02-05: qty 5, 70d supply, fill #0
  Filled 2024-02-06: qty 1, 14d supply, fill #0

## 2024-02-05 NOTE — Telephone Encounter (Signed)
 Patient is scheduled for labs on 02/13/24 and scheduled to see Clotilda on 02/20/24. He is out of medication and needs Testosterone  refill. Pharmacy is [facility].

## 2024-02-06 ENCOUNTER — Other Ambulatory Visit: Payer: Self-pay

## 2024-02-06 MED ORDER — DULOXETINE HCL 20 MG PO CPEP
20.0000 mg | ORAL_CAPSULE | Freq: Every day | ORAL | 0 refills | Status: AC
  Filled 2024-02-06: qty 90, 90d supply, fill #0

## 2024-02-11 ENCOUNTER — Other Ambulatory Visit: Payer: Self-pay

## 2024-02-11 DIAGNOSIS — E291 Testicular hypofunction: Secondary | ICD-10-CM

## 2024-02-11 DIAGNOSIS — Z125 Encounter for screening for malignant neoplasm of prostate: Secondary | ICD-10-CM

## 2024-02-13 ENCOUNTER — Other Ambulatory Visit

## 2024-02-13 DIAGNOSIS — E291 Testicular hypofunction: Secondary | ICD-10-CM

## 2024-02-13 DIAGNOSIS — Z125 Encounter for screening for malignant neoplasm of prostate: Secondary | ICD-10-CM

## 2024-02-14 LAB — HEMOGLOBIN AND HEMATOCRIT, BLOOD
Hematocrit: 46.2 % (ref 37.5–51.0)
Hemoglobin: 15.2 g/dL (ref 13.0–17.7)

## 2024-02-14 LAB — PSA: Prostate Specific Ag, Serum: 1.9 ng/mL (ref 0.0–4.0)

## 2024-02-14 LAB — TESTOSTERONE: Testosterone: 198 ng/dL — ABNORMAL LOW (ref 264–916)

## 2024-02-17 ENCOUNTER — Other Ambulatory Visit: Payer: Self-pay | Admitting: Student in an Organized Health Care Education/Training Program

## 2024-02-17 ENCOUNTER — Other Ambulatory Visit: Payer: Self-pay

## 2024-02-17 ENCOUNTER — Ambulatory Visit
Admission: RE | Admit: 2024-02-17 | Discharge: 2024-02-17 | Disposition: A | Source: Ambulatory Visit | Attending: Student in an Organized Health Care Education/Training Program | Admitting: Student in an Organized Health Care Education/Training Program

## 2024-02-17 DIAGNOSIS — M5414 Radiculopathy, thoracic region: Secondary | ICD-10-CM

## 2024-02-17 DIAGNOSIS — G5782 Other specified mononeuropathies of left lower limb: Secondary | ICD-10-CM

## 2024-02-17 DIAGNOSIS — G8929 Other chronic pain: Secondary | ICD-10-CM

## 2024-02-17 DIAGNOSIS — G5792 Unspecified mononeuropathy of left lower limb: Secondary | ICD-10-CM

## 2024-02-17 DIAGNOSIS — G894 Chronic pain syndrome: Secondary | ICD-10-CM

## 2024-02-19 ENCOUNTER — Ambulatory Visit
Admission: RE | Admit: 2024-02-19 | Discharge: 2024-02-19 | Disposition: A | Source: Ambulatory Visit | Attending: Student in an Organized Health Care Education/Training Program | Admitting: Student in an Organized Health Care Education/Training Program

## 2024-02-19 ENCOUNTER — Ambulatory Visit (HOSPITAL_BASED_OUTPATIENT_CLINIC_OR_DEPARTMENT_OTHER): Admitting: Student in an Organized Health Care Education/Training Program

## 2024-02-19 ENCOUNTER — Other Ambulatory Visit: Payer: Self-pay

## 2024-02-19 ENCOUNTER — Encounter: Payer: Self-pay | Admitting: Student in an Organized Health Care Education/Training Program

## 2024-02-19 VITALS — BP 137/74 | HR 82 | Temp 98.4°F | Resp 15 | Ht 70.0 in | Wt 225.0 lb

## 2024-02-19 DIAGNOSIS — M546 Pain in thoracic spine: Secondary | ICD-10-CM | POA: Diagnosis not present

## 2024-02-19 DIAGNOSIS — G8929 Other chronic pain: Secondary | ICD-10-CM | POA: Diagnosis not present

## 2024-02-19 DIAGNOSIS — M5414 Radiculopathy, thoracic region: Secondary | ICD-10-CM

## 2024-02-19 MED ORDER — ROPIVACAINE HCL 2 MG/ML IJ SOLN
2.0000 mL | Freq: Once | INTRAMUSCULAR | Status: AC
  Administered 2024-02-19: 2 mL via EPIDURAL
  Filled 2024-02-19: qty 20

## 2024-02-19 MED ORDER — DIAZEPAM 5 MG PO TABS
ORAL_TABLET | ORAL | Status: AC
  Filled 2024-02-19: qty 1

## 2024-02-19 MED ORDER — IOHEXOL 180 MG/ML  SOLN
10.0000 mL | Freq: Once | INTRAMUSCULAR | Status: AC
  Administered 2024-02-19: 10 mL via EPIDURAL
  Filled 2024-02-19: qty 20

## 2024-02-19 MED ORDER — DEXAMETHASONE SOD PHOSPHATE PF 10 MG/ML IJ SOLN
10.0000 mg | Freq: Once | INTRAMUSCULAR | Status: AC
  Administered 2024-02-19: 10 mg
  Filled 2024-02-19: qty 1

## 2024-02-19 MED ORDER — DIAZEPAM 5 MG PO TABS: 10.0000 mg | ORAL_TABLET | ORAL | Status: DC

## 2024-02-19 MED ORDER — SODIUM CHLORIDE 0.9% FLUSH
2.0000 mL | Freq: Once | INTRAVENOUS | Status: AC
  Administered 2024-02-19: 2 mL

## 2024-02-19 MED ORDER — LIDOCAINE HCL 2 % IJ SOLN
20.0000 mL | Freq: Once | INTRAMUSCULAR | Status: AC
  Administered 2024-02-19: 400 mg
  Filled 2024-02-19: qty 20

## 2024-02-19 NOTE — Patient Instructions (Signed)

## 2024-02-19 NOTE — Progress Notes (Signed)
 PROVIDER NOTE: Interpretation of information contained herein should be left to medically-trained personnel. Specific patient instructions are provided elsewhere under Patient Instructions section of medical record. This document was created in part using STT-dictation technology, any transcriptional errors that may result from this process are unintentional.  Patient: Darren Miller Type: Established DOB: 02-Aug-1985 MRN: [id number] PCP: Valerio Melanie DASEN, NP  Service: Procedure DOS: 02/19/2024 Setting: Ambulatory Location: Ambulatory outpatient facility Delivery: Face-to-face Provider: Wallie Sherry, MD Specialty: Interventional Pain Management Specialty designation: 09 Location: Outpatient facility Ref. Prov.: Cannady, Jolene T, NP       Interventional Therapy   Type:  Inter-Laminar Thoracic Epidural Steroid Block/Injection  #1  Laterality: Midline Level: T11-12  DOS: 02/19/2024 Performed by: Wallie Sherry, MD Imaging: Fluoroscopic guidance Anesthesia: Local anesthesia (1-2% Lidocaine ) Anxiolysis: Valium  10 mg  Purpose: Diagnostic/Therapeutic Indications: Thoracic back pain, radicular pain, with degenerative disc disease severe enough to impact quality of life or function. 1. Radicular pain of thoracic region   2. Chronic midline thoracic back pain    NAS-11 Pain score:   Pre-procedure: 8 /10   Post-procedure: 8 /10      Position / Prep / Materials: Position: Prone  Prep solution: ChloraPrep (2% chlorhexidine  gluconate and 70% isopropyl alcohol) Prep Area: Posterior Thoracolumbar (Upper back from shoulders to lower lumbar region).  Materials:  Tray: Epidural Needle(s) Type: Epidural needle Gauge (G): 22 Length: Regular (3.5-in) Qty: 1  H&P (Pre-op Assessment):  Mr. Bolls is a 39 y.o. (year old), male patient, seen today for interventional treatment. He  has a past surgical history that includes Appendectomy; Colonoscopy with propofol  (N/A, 11/19/2017);  Esophagogastroduodenoscopy (egd) with propofol  (N/A, 11/19/2017); Knee arthroscopy with meniscal repair (Right, 07/16/2019); and Hernia repair. Mr. Bair has a current medication list which includes the following prescription(s): amphetamine -dextroamphetamine , amphetamine -dextroamphetamine , [START ON 03/13/2024] amphetamine -dextroamphetamine , clobetasol  ointment, clobetasol  ointment, eucrisa , docusate sodium , duloxetine , epinephrine , famotidine , folic acid , hydrocortisone , lidocaine , methotrexate , medi-first triple antibiotic, oxycodone -acetaminophen , pantoprazole , prednisone , pregabalin , ropinirole , senna-docusate, simethicone , simethicone , sumatriptan , tadalafil , testosterone  cypionate, triamcinolone  cream, and zolpidem , and the following Facility-Administered Medications: diazepam  and testosterone  cypionate. His primarily concern today is the Abdominal Pain  Initial Vital Signs:  Pulse/HCG Rate: 82ECG Heart Rate: 76 Temp: 98.4 F (36.9 C) Resp: 16 BP: 134/89 SpO2: 98 %  BMI: Estimated body mass index is 32.28 kg/m as calculated from the following:   Height as of this encounter: 5' 10 (1.778 m).   Weight as of this encounter: 225 lb (102.1 kg).  Risk Assessment: Allergies: Reviewed. He is allergic to iodinated contrast media, morphine , silicone, tape, and vancomycin .  Allergy Precautions: None required Coagulopathies: Reviewed. None identified.  Blood-thinner therapy: None at this time Active Infection(s): Reviewed. None identified. Mr. Moree is afebrile  Site Confirmation: [name] was asked to confirm the procedure and laterality before marking the site Procedure checklist: Completed Consent: Before the procedure and under the influence of no sedative(s), amnesic(s), or anxiolytics, the patient was informed of the treatment options, risks and possible complications. To fulfill our ethical and legal obligations, as recommended by the American Medical Association's Code of Ethics, I  have informed the patient of my clinical impression; the nature and purpose of the treatment or procedure; the risks, benefits, and possible complications of the intervention; the alternatives, including doing nothing; the risk(s) and benefit(s) of the alternative treatment(s) or procedure(s); and the risk(s) and benefit(s) of doing nothing. The patient was provided information about the general risks and possible complications associated with the procedure. These may include, but are  not limited to: failure to achieve desired goals, infection, bleeding, organ or nerve damage, allergic reactions, paralysis, and death. In addition, the patient was informed of those risks and complications associated to Spine-related procedures, such as failure to decrease pain; infection (i.e.: Meningitis, epidural or intraspinal abscess); bleeding (i.e.: epidural hematoma, subarachnoid hemorrhage, or any other type of intraspinal or peri-dural bleeding); organ or nerve damage (i.e.: Any type of peripheral nerve, nerve root, or spinal cord injury) with subsequent damage to sensory, motor, and/or autonomic systems, resulting in permanent pain, numbness, and/or weakness of one or several areas of the body; allergic reactions; (i.e.: anaphylactic reaction); and/or death. Furthermore, the patient was informed of those risks and complications associated with the medications. These include, but are not limited to: allergic reactions (i.e.: anaphylactic or anaphylactoid reaction(s)); adrenal axis suppression; blood sugar elevation that in diabetics may result in ketoacidosis or comma; water retention that in patients with history of congestive heart failure may result in shortness of breath, pulmonary edema, and decompensation with resultant heart failure; weight gain; swelling or edema; medication-induced neural toxicity; particulate matter embolism and blood vessel occlusion with resultant organ, and/or nervous system infarction;  and/or aseptic necrosis of one or more joints. Finally, the patient was informed that Medicine is not an exact science; therefore, there is also the possibility of unforeseen or unpredictable risks and/or possible complications that may result in a catastrophic outcome. The patient indicated having understood very clearly. We have given the patient no guarantees and we have made no promises. Enough time was given to the patient to ask questions, all of which were answered to the patient's satisfaction. Mr. Jasmin has indicated that he wanted to continue with the procedure. Attestation: I, the ordering provider, attest that I have discussed with the patient the benefits, risks, side-effects, alternatives, likelihood of achieving goals, and potential problems during recovery for the procedure that I have provided informed consent. Date  Time: 02/19/2024 11:32 AM  Pre-Procedure Preparation:  Monitoring: As per clinic protocol. Respiration, ETCO2, SpO2, BP, heart rate and rhythm monitor placed and checked for adequate function Safety Precautions: Patient was assessed for positional comfort and pressure points before starting the procedure. Time-out: I initiated and conducted the Time-out before starting the procedure, as per protocol. The patient was asked to participate by confirming the accuracy of the Time Out information. Verification of the correct person, site, and procedure were performed and confirmed by me, the nursing staff, and the patient. Time-out conducted as per Joint Commission's Universal Protocol (UP.01.01.01). Time: 1230 Start Time: 1230 hrs.   Description of Procedure:         Target Area: For Epidural Steroid injection(s), the target area is the  interlaminar space, initially targeting the lower border of the superior vertebral body lamina. Approach: Interlaminar approach. Area Prepped: Entire Posterior Thoracolumbar Region ChloraPrep (2% chlorhexidine  gluconate and 70%  isopropyl alcohol) Safety Precautions: Aspiration looking for blood return was conducted prior to all injections. At no point did we inject any substances, as a needle was being advanced. No attempts were made at seeking any paresthesias. Safe injection practices and needle disposal techniques used. Medications properly checked for expiration dates. SDV (single dose vial) medications used. Description of the Procedure: Protocol guidelines were followed. The patient was placed in position over the fluoroscopy table. The target area was identified and the area prepped in the usual manner. Skin & deeper tissues infiltrated with local anesthetic. Appropriate amount of time allowed to pass for local anesthetics to take effect.  The procedure needles were then advanced to the target area. The inferior aspect of the superior lamina was contacted and the needle walked caudad, until the lamina was cleared. The epidural space was identified using loss-of-resistance technique with 0.9% PF-NSS (2-3mL), in a low friction 10cc LOR glass syringe. Proper needle placement was secured. Negative aspiration confirmed. Solution injected in intermittent fashion, asking for systemic symptoms every 0.5 cc of injectate. The needles were then removed and the area cleansed, making sure to leave some of the prepping solution behind to take advantage of its long term bactericidal properties. Vitals:   02/19/24 1235 02/19/24 1240 02/19/24 1244 02/19/24 1247  BP: (!) 139/92 (!) 127/103 (!) 135/93 137/74  Pulse:      Resp: 18 15 18 15   Temp:      TempSrc:      SpO2: 99% 100% 98% 98%  Weight:      Height:        Start Time: 1230 hrs. End Time: 1246 hrs. Imaging Guidance (Spinal):          Type of Imaging Technique: Fluoroscopy Guidance (Spinal) Indication(s): Fluoroscopy guidance for needle placement to enhance accuracy in procedures requiring precise needle localization for targeted delivery of medication in or near specific  anatomical locations not easily accessible without such real-time imaging assistance. Exposure Time: Please see nurses notes. Contrast: Before injecting any contrast, we confirmed that the patient did not have an allergy to iodine, shellfish, or radiological contrast. Once satisfactory needle placement was completed at the desired level, radiological contrast was injected. Contrast injected under live fluoroscopy. No contrast complications. See chart for type and volume of contrast used. Fluoroscopic Guidance: I was personally present during the use of fluoroscopy. Tunnel Vision Technique used to obtain the best possible view of the target area. Parallax error corrected before commencing the procedure. Direction-depth-direction technique used to introduce the needle under continuous pulsed fluoroscopy. Once target was reached, antero-posterior, oblique, and lateral fluoroscopic projection used confirm needle placement in all planes. Images permanently stored in EMR. Interpretation: I personally interpreted the imaging intraoperatively. Adequate needle placement confirmed in multiple planes. Appropriate spread of contrast into desired area was observed. No evidence of afferent or efferent intravascular uptake. No intrathecal or subarachnoid spread observed. Permanent images saved into the patient's record.  Post-operative Assessment:  Post-procedure Vital Signs:  Pulse/HCG Rate: 8274 Temp: 98.4 F (36.9 C) Resp: 15 BP: 137/74 SpO2: 98 %  EBL: None  Complications: No immediate post-treatment complications observed by team, or reported by patient.  Note: The patient tolerated the entire procedure well. A repeat set of vitals were taken after the procedure and the patient was kept under observation following institutional policy, for this type of procedure. Post-procedural neurological assessment was performed, showing return to baseline, prior to discharge. The patient was provided with  post-procedure discharge instructions, including a section on how to identify potential problems. Should any problems arise concerning this procedure, the patient was given instructions to immediately contact us , at any time, without hesitation. In any case, we plan to contact the patient by telephone for a follow-up status report regarding this interventional procedure.  Comments:  No additional relevant information.  Plan of Care (POC)  Orders:  Orders Placed This Encounter  Procedures   DG PAIN CLINIC C-ARM 1-60 MIN NO REPORT    Intraoperative interpretation by procedural physician at [facility].    Standing Status:   Standing    Number of Occurrences:   1  Reason for exam::   Assistance in needle guidance and placement for procedures requiring needle placement in or near specific anatomical locations not easily accessible without such assistance.    Medications ordered for procedure: Meds ordered this encounter  Medications   iohexol  (OMNIPAQUE ) 180 MG/ML injection 10 mL    Must be Myelogram-compatible. If not available, you may substitute with a water-soluble, non-ionic, hypoallergenic, myelogram-compatible radiological contrast medium.   lidocaine  (XYLOCAINE ) 2 % (with pres) injection 400 mg   ropivacaine  (PF) 2 mg/mL (0.2%) (NAROPIN ) injection 2 mL   sodium chloride  flush (NS) 0.9 % injection 2 mL   dexamethasone  (DECADRON ) injection 10 mg   diazepam  (VALIUM ) tablet 10 mg    Make sure Flumazenil is available in the pyxis when using this medication. If oversedation occurs, administer 0.2 mg IV over 15 sec. If after 45 sec no response, administer 0.2 mg again over 1 min; may repeat at 1 min intervals; not to exceed 4 doses (1 mg)   Medications administered: We administered iohexol , lidocaine , ropivacaine  (PF) 2 mg/mL (0.2%), sodium chloride  flush, and dexamethasone .  See the medical record for exact dosing, route, and time of administration.  Follow-up plan:    Return for Keep sch. appt.     Recent Visits Date Type Provider Dept  02/04/24 Office Visit Marcelino Nurse, MD [facility]  01/06/24 Office Visit Patel, Seema K, NP [facility]  Showing recent visits within past 90 days and meeting all other requirements Today's Visits Date Type Provider Dept  02/19/24 Procedure visit Marcelino Nurse, MD [facility]  Showing today's visits and meeting all other requirements Future Appointments Date Type Provider Dept  03/19/24 Appointment Patel, Seema K, NP [facility]  Showing future appointments within next 90 days and meeting all other requirements   Disposition: Discharge home  Discharge (Date  Time): 02/19/2024; 1257 hrs.   Primary Care Physician: Cannady, Jolene T, NP Location: [facility] Note by: Nurse Marcelino, MD (TTS technology used. I apologize for any typographical errors that were not detected and corrected.) Date: 02/19/2024; Time: 3:19 PM  Disclaimer:  Medicine is not an visual merchandiser. The only guarantee in medicine is that nothing is guaranteed. It is important to note that the decision to proceed with this intervention was based on the information collected from the patient. The Data and conclusions were drawn from the patient's questionnaire, the interview, and the physical examination. Because the information was provided in large part by the patient, it cannot be guaranteed that it has not been purposely or unconsciously manipulated. Every effort has been made to obtain as much relevant data as possible for this evaluation. It is important to note that the conclusions that lead to this procedure are derived in large part from the available data. Always take into account that the treatment will also be dependent on availability of resources and existing treatment guidelines, considered by other Pain Management Practitioners as being common knowledge and practice, at the time  of the intervention. For Medico-Legal purposes, it is also important to point out that variation in procedural techniques and pharmacological choices are the acceptable norm. The indications, contraindications, technique, and results of the above procedure should only be interpreted and judged by a Board-Certified Interventional Pain Specialist with extensive familiarity and expertise in the same exact procedure and technique.

## 2024-02-20 ENCOUNTER — Other Ambulatory Visit: Payer: Self-pay

## 2024-02-20 ENCOUNTER — Telehealth: Payer: Self-pay

## 2024-02-20 ENCOUNTER — Encounter: Payer: Self-pay | Admitting: Urology

## 2024-02-20 ENCOUNTER — Ambulatory Visit: Admitting: Urology

## 2024-02-20 VITALS — BP 142/81 | HR 83 | Wt 222.0 lb

## 2024-02-20 DIAGNOSIS — E291 Testicular hypofunction: Secondary | ICD-10-CM | POA: Diagnosis not present

## 2024-02-20 DIAGNOSIS — N401 Enlarged prostate with lower urinary tract symptoms: Secondary | ICD-10-CM | POA: Diagnosis not present

## 2024-02-20 DIAGNOSIS — N138 Other obstructive and reflux uropathy: Secondary | ICD-10-CM

## 2024-02-20 DIAGNOSIS — N529 Male erectile dysfunction, unspecified: Secondary | ICD-10-CM | POA: Diagnosis not present

## 2024-02-20 DIAGNOSIS — N5089 Other specified disorders of the male genital organs: Secondary | ICD-10-CM | POA: Diagnosis not present

## 2024-02-20 MED ORDER — TADALAFIL 5 MG PO TABS
5.0000 mg | ORAL_TABLET | Freq: Every day | ORAL | 3 refills | Status: AC | PRN
  Filled 2024-02-20: qty 90, 90d supply, fill #0

## 2024-02-20 MED ORDER — TADALAFIL 20 MG PO TABS
ORAL_TABLET | ORAL | 3 refills | Status: AC
  Filled 2024-02-20: qty 30, 30d supply, fill #0

## 2024-02-20 NOTE — Patient Instructions (Signed)
 Please contact central scheduling at [phone]

## 2024-02-20 NOTE — Addendum Note (Signed)
 Addended by: Cherity Blickenstaff A on: 02/20/2024 11:32 AM   Modules accepted: Orders

## 2024-02-20 NOTE — Telephone Encounter (Signed)
 PPE states pain id=s better. Instruected to call if needed.

## 2024-03-19 ENCOUNTER — Encounter: Admitting: Nurse Practitioner

## 2024-04-01 ENCOUNTER — Ambulatory Visit: Admitting: Nurse Practitioner

## 2024-04-23 ENCOUNTER — Ambulatory Visit: Admitting: Urology
